# Patient Record
Sex: Female | Born: 1955 | Race: Black or African American | Hispanic: No | Marital: Single | Smoking: Never smoker
Health system: Southern US, Community
[De-identification: ages and names within clinical notes are randomized; demographics above are authoritative.]

## PROBLEM LIST (undated history)

## (undated) DIAGNOSIS — T7840XA Allergy, unspecified, initial encounter: Secondary | ICD-10-CM

## (undated) DIAGNOSIS — M1990 Unspecified osteoarthritis, unspecified site: Secondary | ICD-10-CM

## (undated) DIAGNOSIS — H9190 Unspecified hearing loss, unspecified ear: Secondary | ICD-10-CM

## (undated) DIAGNOSIS — E119 Type 2 diabetes mellitus without complications: Secondary | ICD-10-CM

## (undated) DIAGNOSIS — G000 Hemophilus meningitis: Secondary | ICD-10-CM

## (undated) DIAGNOSIS — I8392 Asymptomatic varicose veins of left lower extremity: Secondary | ICD-10-CM

## (undated) DIAGNOSIS — E785 Hyperlipidemia, unspecified: Secondary | ICD-10-CM

## (undated) DIAGNOSIS — G4730 Sleep apnea, unspecified: Secondary | ICD-10-CM

## (undated) HISTORY — PX: BREAST SURGERY: SHX581

## (undated) HISTORY — DX: Type 2 diabetes mellitus without complications: E11.9

## (undated) HISTORY — DX: Allergy, unspecified, initial encounter: T78.40XA

## (undated) HISTORY — DX: Hyperlipidemia, unspecified: E78.5

## (undated) HISTORY — DX: Hemophilus meningitis: G00.0

## (undated) HISTORY — PX: BREAST EXCISIONAL BIOPSY: SUR124

## (undated) HISTORY — PX: PARTIAL HYSTERECTOMY: SHX80

---

## 2000-12-08 ENCOUNTER — Other Ambulatory Visit: Admission: RE | Admit: 2000-12-08 | Discharge: 2000-12-08 | Payer: Self-pay | Admitting: Obstetrics and Gynecology

## 2001-04-21 ENCOUNTER — Encounter (INDEPENDENT_AMBULATORY_CARE_PROVIDER_SITE_OTHER): Payer: Self-pay | Admitting: Internal Medicine

## 2001-04-21 ENCOUNTER — Ambulatory Visit (HOSPITAL_COMMUNITY): Admission: RE | Admit: 2001-04-21 | Discharge: 2001-04-21 | Payer: Self-pay | Admitting: Internal Medicine

## 2002-05-05 ENCOUNTER — Encounter: Payer: Self-pay | Admitting: Internal Medicine

## 2002-05-05 ENCOUNTER — Ambulatory Visit (HOSPITAL_COMMUNITY): Admission: RE | Admit: 2002-05-05 | Discharge: 2002-05-05 | Payer: Self-pay | Admitting: Internal Medicine

## 2002-12-09 ENCOUNTER — Ambulatory Visit (HOSPITAL_COMMUNITY): Admission: RE | Admit: 2002-12-09 | Discharge: 2002-12-09 | Payer: Self-pay | Admitting: Obstetrics and Gynecology

## 2002-12-09 ENCOUNTER — Encounter: Payer: Self-pay | Admitting: Obstetrics and Gynecology

## 2003-11-04 ENCOUNTER — Emergency Department (HOSPITAL_COMMUNITY): Admission: EM | Admit: 2003-11-04 | Discharge: 2003-11-04 | Payer: Self-pay | Admitting: Emergency Medicine

## 2004-05-24 ENCOUNTER — Ambulatory Visit (HOSPITAL_COMMUNITY): Admission: RE | Admit: 2004-05-24 | Discharge: 2004-05-24 | Payer: Self-pay | Admitting: Internal Medicine

## 2005-03-25 ENCOUNTER — Emergency Department (HOSPITAL_COMMUNITY): Admission: EM | Admit: 2005-03-25 | Discharge: 2005-03-25 | Payer: Self-pay | Admitting: Emergency Medicine

## 2006-07-03 ENCOUNTER — Ambulatory Visit (HOSPITAL_COMMUNITY): Admission: RE | Admit: 2006-07-03 | Discharge: 2006-07-03 | Payer: Self-pay | Admitting: Internal Medicine

## 2009-05-02 ENCOUNTER — Emergency Department (HOSPITAL_COMMUNITY): Admission: EM | Admit: 2009-05-02 | Discharge: 2009-05-02 | Payer: Self-pay | Admitting: Emergency Medicine

## 2010-02-13 ENCOUNTER — Encounter: Payer: Self-pay | Admitting: Gastroenterology

## 2010-02-19 ENCOUNTER — Ambulatory Visit (HOSPITAL_COMMUNITY)
Admission: RE | Admit: 2010-02-19 | Discharge: 2010-02-19 | Payer: Self-pay | Source: Home / Self Care | Attending: Gastroenterology | Admitting: Gastroenterology

## 2010-02-26 ENCOUNTER — Ambulatory Visit (HOSPITAL_COMMUNITY)
Admission: RE | Admit: 2010-02-26 | Discharge: 2010-02-26 | Payer: Self-pay | Source: Home / Self Care | Attending: Internal Medicine | Admitting: Internal Medicine

## 2010-04-12 NOTE — Letter (Signed)
Summary: [facility] ORDER/TRIAGE  [facility] ORDER/TRIAGE   Imported By: Rexene Alberts 02/13/2010 15:11:45  _____________________________________________________________________  External Attachment:    Type:   Image     Comment:   External Document

## 2010-05-30 LAB — POCT CARDIAC MARKERS
CKMB, poc: 1.3 ng/mL (ref 1.0–8.0)
Myoglobin, poc: 75 ng/mL (ref 12–200)

## 2010-05-30 LAB — COMPREHENSIVE METABOLIC PANEL
ALT: 17 U/L (ref 0–35)
Alkaline Phosphatase: 98 U/L (ref 39–117)
Chloride: 104 mEq/L (ref 96–112)
GFR calc Af Amer: >60 mL/min (ref >60)
GFR calc non Af Amer: >60 mL/min (ref >60)
Glucose, Bld: 96 mg/dL (ref 70–99)
Potassium: 3.7 mEq/L (ref 3.5–5.1)
Sodium: 137 mEq/L (ref 135–145)
Total Bilirubin: 0.5 mg/dL (ref 0.3–1.2)

## 2010-05-30 LAB — CBC
MCHC: 33.4 g/dL (ref 30.0–36.0)
MCV: 88.8 fL (ref 78.0–100.0)
Platelets: 284 10*3/uL (ref 150–400)
RDW: 14.3 % (ref 11.5–15.5)

## 2010-05-30 LAB — DIFFERENTIAL
Basophils Relative: 1 % (ref 0–1)
Eosinophils Absolute: 0.2 10*3/uL (ref 0.0–0.7)
Eosinophils Relative: 3 % (ref 0–5)
Lymphs Abs: 2.6 10*3/uL (ref 0.7–4.0)

## 2010-05-30 LAB — PROTIME-INR: Prothrombin Time: 13.8 seconds (ref 11.6–15.2)

## 2010-06-25 ENCOUNTER — Other Ambulatory Visit (HOSPITAL_COMMUNITY): Payer: Self-pay | Admitting: Internal Medicine

## 2010-06-25 ENCOUNTER — Ambulatory Visit (HOSPITAL_COMMUNITY)
Admission: RE | Admit: 2010-06-25 | Discharge: 2010-06-25 | Disposition: A | Discharge status: Home / Self Care | Payer: BC Managed Care – PPO | Source: Ambulatory Visit | Attending: Internal Medicine | Admitting: Internal Medicine

## 2010-06-25 DIAGNOSIS — M25569 Pain in unspecified knee: Secondary | ICD-10-CM | POA: Insufficient documentation

## 2010-06-25 DIAGNOSIS — M545 Low back pain, unspecified: Secondary | ICD-10-CM | POA: Insufficient documentation

## 2010-07-26 ENCOUNTER — Encounter: Payer: Self-pay | Admitting: Orthopedic Surgery

## 2010-07-26 ENCOUNTER — Ambulatory Visit (INDEPENDENT_AMBULATORY_CARE_PROVIDER_SITE_OTHER): Payer: BC Managed Care – PPO | Admitting: Orthopedic Surgery

## 2010-07-26 VITALS — Ht 62.0 in | Wt 231.8 lb

## 2010-07-26 DIAGNOSIS — M545 Low back pain, unspecified: Secondary | ICD-10-CM

## 2010-07-26 DIAGNOSIS — M79605 Pain in left leg: Secondary | ICD-10-CM | POA: Insufficient documentation

## 2010-07-26 MED ORDER — PREDNISONE (PAK) 10 MG PO TABS: ORAL_TABLET | ORAL | Status: DC

## 2010-07-26 NOTE — Progress Notes (Signed)
Chief complaint LEFT knee pain and LEFT sided pain.  55 year old female presents with pain on the LEFT side of her hip, leg and also in the LEFT knee. Complains of sharp throbbing 9/10 pain, which seems to come and go is worse in the evening. It is improved with heat. It is worse with cold air and standing for long periods of time and is associated with numbness and tingling.  Should have x-rays at the hospital, which showed early onset degenerative changes in the knee. She also had lumbar spine film, which showed degenerative facet disease in the lower lumbar spine.  Her pain was initially relieved with tramadol 3 times a day. She also took several aspirin, as well as multiple tablets of Aleve daily. She has been advised that this will cause an ulcer and that this should not be done.  Her review of systems is remarkable for chills, times, blurred vision, chest pain, and snoring, seasonal allergies. She denies heartburn, frequency, joint pain other than stated, changes in her skin, nervousness, or anxiety, easy bruising or excessive thirst.  The vital signs are 231 pounds 5 foot 2. Respiratory rate of 20  General appearance was normal.  Cardiovascular exam showed normal pulses and normal temperature in her extremity.  Lymph system was negative in the lower extremities.  Skin is normal in the lower extremities.  She is awake, alert, and oriented x3. Mood and affect were normal.  Reflexes are 2+ and equal at the ankle and knee. Toes are downgoing. Straight leg raise test was positive at 30 on the LEFT normal on the RIGHT.  Lumbar spine was tender and L4-L5 junction in the LEFT hip and LEFT gluteal region.  LEFT and RIGHT knee. Range of motion was normal. Joints were stable. No effusion. No tenderness. No pain with range of motion.  Radiographs were reviewed report.  Impression low back pain with LEFT leg radicular symptoms.  Advised to stop aspirin and Aleve take a Dosepak for 12 days  and resume Aleve one b.i.d. Physical therapy for back pain. Return in 6 weeks.

## 2010-08-13 ENCOUNTER — Ambulatory Visit (HOSPITAL_COMMUNITY)
Admission: RE | Admit: 2010-08-13 | Discharge: 2010-08-13 | Disposition: A | Discharge status: Home / Self Care | Payer: BC Managed Care – PPO | Source: Ambulatory Visit | Attending: Internal Medicine | Admitting: Internal Medicine

## 2010-08-13 DIAGNOSIS — M545 Low back pain, unspecified: Secondary | ICD-10-CM | POA: Insufficient documentation

## 2010-08-13 DIAGNOSIS — R262 Difficulty in walking, not elsewhere classified: Secondary | ICD-10-CM | POA: Insufficient documentation

## 2010-08-13 DIAGNOSIS — IMO0001 Reserved for inherently not codable concepts without codable children: Secondary | ICD-10-CM | POA: Insufficient documentation

## 2010-08-13 DIAGNOSIS — M6281 Muscle weakness (generalized): Secondary | ICD-10-CM | POA: Insufficient documentation

## 2010-08-15 ENCOUNTER — Ambulatory Visit (HOSPITAL_COMMUNITY)
Admission: RE | Admit: 2010-08-15 | Discharge: 2010-08-15 | Disposition: A | Discharge status: Home / Self Care | Payer: BC Managed Care – PPO | Source: Ambulatory Visit | Attending: Internal Medicine | Admitting: Internal Medicine

## 2010-08-17 ENCOUNTER — Inpatient Hospital Stay (HOSPITAL_COMMUNITY)
Admission: RE | Admit: 2010-08-17 | Discharge: 2010-08-17 | Disposition: A | Discharge status: Home / Self Care | Payer: BC Managed Care – PPO | Source: Ambulatory Visit | Attending: Physical Therapy | Admitting: Physical Therapy

## 2010-08-20 ENCOUNTER — Inpatient Hospital Stay (HOSPITAL_COMMUNITY)
Admission: RE | Admit: 2010-08-20 | Discharge: 2010-08-20 | Disposition: A | Discharge status: Home / Self Care | Payer: BC Managed Care – PPO | Source: Ambulatory Visit | Attending: Physical Therapy | Admitting: Physical Therapy

## 2010-08-22 ENCOUNTER — Inpatient Hospital Stay (HOSPITAL_COMMUNITY)
Admission: RE | Admit: 2010-08-22 | Discharge: 2010-08-22 | Disposition: A | Discharge status: Home / Self Care | Payer: BC Managed Care – PPO | Source: Ambulatory Visit | Attending: Physical Therapy | Admitting: Physical Therapy

## 2010-08-24 ENCOUNTER — Ambulatory Visit (HOSPITAL_COMMUNITY): Payer: BC Managed Care – PPO

## 2010-08-27 ENCOUNTER — Ambulatory Visit (HOSPITAL_COMMUNITY): Payer: BC Managed Care – PPO | Admitting: Physical Therapy

## 2010-08-29 ENCOUNTER — Ambulatory Visit (HOSPITAL_COMMUNITY)
Admission: RE | Admit: 2010-08-29 | Discharge: 2010-08-29 | Disposition: A | Discharge status: Home / Self Care | Payer: BC Managed Care – PPO | Source: Ambulatory Visit | Attending: Internal Medicine | Admitting: Internal Medicine

## 2010-08-31 ENCOUNTER — Ambulatory Visit (HOSPITAL_COMMUNITY): Payer: BC Managed Care – PPO | Admitting: Physical Therapy

## 2010-09-03 ENCOUNTER — Ambulatory Visit (HOSPITAL_COMMUNITY): Payer: BC Managed Care – PPO | Admitting: Physical Therapy

## 2010-09-05 ENCOUNTER — Ambulatory Visit (HOSPITAL_COMMUNITY)
Admission: RE | Admit: 2010-09-05 | Discharge: 2010-09-05 | Disposition: A | Discharge status: Home / Self Care | Payer: BC Managed Care – PPO | Source: Ambulatory Visit | Attending: Internal Medicine | Admitting: Internal Medicine

## 2010-09-06 ENCOUNTER — Ambulatory Visit: Payer: BC Managed Care – PPO | Admitting: Orthopedic Surgery

## 2010-09-07 ENCOUNTER — Ambulatory Visit (HOSPITAL_COMMUNITY): Payer: BC Managed Care – PPO

## 2010-09-10 ENCOUNTER — Ambulatory Visit (HOSPITAL_COMMUNITY): Payer: BC Managed Care – PPO | Admitting: Physical Therapy

## 2010-09-14 ENCOUNTER — Ambulatory Visit (HOSPITAL_COMMUNITY): Payer: BC Managed Care – PPO | Admitting: *Deleted

## 2010-09-17 ENCOUNTER — Ambulatory Visit (HOSPITAL_COMMUNITY): Payer: BC Managed Care – PPO | Admitting: Physical Therapy

## 2010-09-19 ENCOUNTER — Ambulatory Visit (HOSPITAL_COMMUNITY): Payer: BC Managed Care – PPO

## 2010-09-21 ENCOUNTER — Ambulatory Visit (HOSPITAL_COMMUNITY): Payer: BC Managed Care – PPO | Admitting: Physical Therapy

## 2011-07-09 ENCOUNTER — Other Ambulatory Visit (HOSPITAL_COMMUNITY): Payer: Self-pay | Admitting: Internal Medicine

## 2011-07-15 ENCOUNTER — Ambulatory Visit (HOSPITAL_COMMUNITY): Payer: BC Managed Care – PPO

## 2013-01-10 ENCOUNTER — Inpatient Hospital Stay (HOSPITAL_COMMUNITY): Payer: BC Managed Care – PPO

## 2013-01-10 ENCOUNTER — Emergency Department (HOSPITAL_COMMUNITY): Payer: BC Managed Care – PPO

## 2013-01-10 ENCOUNTER — Encounter (HOSPITAL_COMMUNITY): Payer: Self-pay | Admitting: Emergency Medicine

## 2013-01-10 ENCOUNTER — Inpatient Hospital Stay (HOSPITAL_COMMUNITY)
Admission: EM | Admit: 2013-01-10 | Discharge: 2013-01-14 | DRG: 871 | Disposition: A | Discharge status: Home / Self Care | Payer: BC Managed Care – PPO | Attending: Internal Medicine | Admitting: Internal Medicine

## 2013-01-10 DIAGNOSIS — G039 Meningitis, unspecified: Secondary | ICD-10-CM

## 2013-01-10 DIAGNOSIS — R7309 Other abnormal glucose: Secondary | ICD-10-CM | POA: Diagnosis present

## 2013-01-10 DIAGNOSIS — Z7982 Long term (current) use of aspirin: Secondary | ICD-10-CM

## 2013-01-10 DIAGNOSIS — M545 Low back pain: Secondary | ICD-10-CM

## 2013-01-10 DIAGNOSIS — A3212 Listerial meningoencephalitis: Secondary | ICD-10-CM | POA: Diagnosis present

## 2013-01-10 DIAGNOSIS — R509 Fever, unspecified: Secondary | ICD-10-CM

## 2013-01-10 DIAGNOSIS — R4182 Altered mental status, unspecified: Secondary | ICD-10-CM

## 2013-01-10 DIAGNOSIS — G000 Hemophilus meningitis: Secondary | ICD-10-CM

## 2013-01-10 DIAGNOSIS — R7881 Bacteremia: Secondary | ICD-10-CM

## 2013-01-10 DIAGNOSIS — Z113 Encounter for screening for infections with a predominantly sexual mode of transmission: Secondary | ICD-10-CM

## 2013-01-10 DIAGNOSIS — Z853 Personal history of malignant neoplasm of breast: Secondary | ICD-10-CM

## 2013-01-10 DIAGNOSIS — A419 Sepsis, unspecified organism: Secondary | ICD-10-CM | POA: Diagnosis present

## 2013-01-10 DIAGNOSIS — G009 Bacterial meningitis, unspecified: Secondary | ICD-10-CM | POA: Diagnosis present

## 2013-01-10 DIAGNOSIS — Z79899 Other long term (current) drug therapy: Secondary | ICD-10-CM

## 2013-01-10 DIAGNOSIS — A413 Sepsis due to Hemophilus influenzae: Principal | ICD-10-CM | POA: Diagnosis present

## 2013-01-10 LAB — RAPID URINE DRUG SCREEN, HOSP PERFORMED
Amphetamines: NOT DETECTED
Barbiturates: NOT DETECTED
Benzodiazepines: NOT DETECTED

## 2013-01-10 LAB — URINALYSIS, ROUTINE W REFLEX MICROSCOPIC
Bilirubin Urine: NEGATIVE
Glucose, UA: NEGATIVE mg/dL
Ketones, ur: NEGATIVE mg/dL
Leukocytes, UA: NEGATIVE
pH: 7.5 (ref 5.0–8.0)

## 2013-01-10 LAB — BLOOD GAS, ARTERIAL
Acid-base deficit: 0.8 mmol/L (ref 0.0–2.0)
Bicarbonate: 22.9 mEq/L (ref 20.0–24.0)
O2 Saturation: 98.4 %
Patient temperature: 37
TCO2: 20.2 mmol/L (ref 0–100)
pO2, Arterial: 119 mmHg — ABNORMAL HIGH (ref 80.0–100.0)

## 2013-01-10 LAB — LACTIC ACID, PLASMA: Lactic Acid, Venous: 1.1 mmol/L (ref 0.5–2.2)

## 2013-01-10 LAB — CBC WITH DIFFERENTIAL/PLATELET
Lymphocytes Relative: 9 % — ABNORMAL LOW (ref 12–46)
Lymphs Abs: 1.2 10*3/uL (ref 0.7–4.0)
MCV: 88.8 fL (ref 78.0–100.0)
Neutrophils Relative %: 82 % — ABNORMAL HIGH (ref 43–77)
Platelets: 317 10*3/uL (ref 150–400)
RBC: 4.36 MIL/uL (ref 3.87–5.11)
WBC: 13.8 10*3/uL — ABNORMAL HIGH (ref 4.0–10.5)

## 2013-01-10 LAB — BASIC METABOLIC PANEL
CO2: 25 mEq/L (ref 19–32)
Glucose, Bld: 151 mg/dL — ABNORMAL HIGH (ref 70–99)
Potassium: 3.7 mEq/L (ref 3.5–5.1)
Sodium: 134 mEq/L — ABNORMAL LOW (ref 135–145)

## 2013-01-10 LAB — HEPATIC FUNCTION PANEL
Alkaline Phosphatase: 140 U/L — ABNORMAL HIGH (ref 39–117)
Bilirubin, Direct: <0.1 mg/dL (ref 0.0–0.3)
Total Protein: 8.2 g/dL (ref 6.0–8.3)

## 2013-01-10 LAB — GLUCOSE, CAPILLARY: Glucose-Capillary: 127 mg/dL — ABNORMAL HIGH (ref 70–99)

## 2013-01-10 LAB — ETHANOL: Alcohol, Ethyl (B): <11 mg/dL (ref 0–11)

## 2013-01-10 MED ORDER — LIDOCAINE-EPINEPHRINE (PF) 1 %-1:200000 IJ SOLN
INTRAMUSCULAR | Status: AC
  Filled 2013-01-10: qty 10

## 2013-01-10 MED ORDER — MIDAZOLAM HCL 5 MG/5ML IJ SOLN
INTRAMUSCULAR | Status: AC | PRN
  Administered 2013-01-10: 5 mg via INTRAVENOUS

## 2013-01-10 MED ORDER — DEXTROSE 5 % IV SOLN
500.0000 mg | Freq: Three times a day (TID) | INTRAVENOUS | Status: DC
  Administered 2013-01-10 – 2013-01-12 (×6): 500 mg via INTRAVENOUS
  Filled 2013-01-10 (×13): qty 10

## 2013-01-10 MED ORDER — VANCOMYCIN HCL IN DEXTROSE 1-5 GM/200ML-% IV SOLN
1000.0000 mg | Freq: Once | INTRAVENOUS | Status: AC
  Administered 2013-01-10: 1000 mg via INTRAVENOUS
  Filled 2013-01-10: qty 200

## 2013-01-10 MED ORDER — DEXTROSE 5 % IV SOLN
600.0000 mg | Freq: Three times a day (TID) | INTRAVENOUS | Status: DC
  Filled 2013-01-10 (×4): qty 12

## 2013-01-10 MED ORDER — VANCOMYCIN HCL 10 G IV SOLR
1250.0000 mg | Freq: Two times a day (BID) | INTRAVENOUS | Status: DC
  Filled 2013-01-10 (×3): qty 1250

## 2013-01-10 MED ORDER — METHYLPREDNISOLONE SODIUM SUCC 125 MG IJ SOLR
125.0000 mg | Freq: Once | INTRAMUSCULAR | Status: AC
  Administered 2013-01-10: 125 mg via INTRAVENOUS
  Filled 2013-01-10: qty 2

## 2013-01-10 MED ORDER — DEXTROSE 5 % IV SOLN
2.0000 g | Freq: Two times a day (BID) | INTRAVENOUS | Status: DC
  Administered 2013-01-11 (×3): 2 g via INTRAVENOUS
  Filled 2013-01-10 (×9): qty 2

## 2013-01-10 MED ORDER — MORPHINE SULFATE 2 MG/ML IJ SOLN
2.0000 mg | INTRAMUSCULAR | Status: DC | PRN
  Administered 2013-01-10 – 2013-01-12 (×7): 2 mg via INTRAVENOUS
  Filled 2013-01-10 (×6): qty 1

## 2013-01-10 MED ORDER — MIDAZOLAM HCL 5 MG/5ML IJ SOLN
INTRAMUSCULAR | Status: AC
  Filled 2013-01-10: qty 10

## 2013-01-10 MED ORDER — SODIUM CHLORIDE 0.9 % IJ SOLN
3.0000 mL | Freq: Two times a day (BID) | INTRAMUSCULAR | Status: DC
  Administered 2013-01-11 – 2013-01-12 (×2): 3 mL via INTRAVENOUS

## 2013-01-10 MED ORDER — ACETAMINOPHEN 325 MG PO TABS: 650.0000 mg | ORAL_TABLET | Freq: Four times a day (QID) | ORAL | Status: DC | PRN

## 2013-01-10 MED ORDER — ACETAMINOPHEN 650 MG RE SUPP
650.0000 mg | Freq: Once | RECTAL | Status: AC
  Administered 2013-01-10: 650 mg via RECTAL
  Filled 2013-01-10: qty 1

## 2013-01-10 MED ORDER — DEXTROSE 5 % IV SOLN
2.0000 g | Freq: Once | INTRAVENOUS | Status: AC
  Administered 2013-01-10: 2 g via INTRAVENOUS
  Filled 2013-01-10: qty 2

## 2013-01-10 MED ORDER — VANCOMYCIN HCL 10 G IV SOLR
2000.0000 mg | Freq: Once | INTRAVENOUS | Status: AC
  Administered 2013-01-11: 2000 mg via INTRAVENOUS
  Filled 2013-01-10: qty 2000

## 2013-01-10 MED ORDER — HEPARIN SODIUM (PORCINE) 5000 UNIT/ML IJ SOLN: 5000.0000 [IU] | Freq: Three times a day (TID) | INTRAMUSCULAR | Status: DC

## 2013-01-10 MED ORDER — ONDANSETRON HCL 4 MG/2ML IJ SOLN: 4.0000 mg | Freq: Four times a day (QID) | INTRAMUSCULAR | Status: DC | PRN

## 2013-01-10 MED ORDER — SULFAMETHOXAZOLE-TMP DS 800-160 MG PO TABS: 1.0000 | ORAL_TABLET | Freq: Once | ORAL | Status: DC

## 2013-01-10 MED ORDER — SODIUM CHLORIDE 0.9 % IV SOLN
INTRAVENOUS | Status: DC
  Administered 2013-01-10: 18:00:00 via INTRAVENOUS
  Administered 2013-01-11: 50 mL/h via INTRAVENOUS
  Administered 2013-01-13: 23:00:00 via INTRAVENOUS

## 2013-01-10 MED ORDER — ACETAMINOPHEN 650 MG RE SUPP: 650.0000 mg | Freq: Four times a day (QID) | RECTAL | Status: DC | PRN

## 2013-01-10 MED ORDER — ONDANSETRON HCL 4 MG PO TABS: 4.0000 mg | ORAL_TABLET | Freq: Four times a day (QID) | ORAL | Status: DC | PRN

## 2013-01-10 MED ORDER — DOXYCYCLINE HYCLATE 100 MG PO TABS: 100.0000 mg | ORAL_TABLET | Freq: Once | ORAL | Status: DC

## 2013-01-10 MED ORDER — LIDOCAINE HCL (PF) 1 % IJ SOLN
INTRAMUSCULAR | Status: AC
  Filled 2013-01-10: qty 10

## 2013-01-10 NOTE — ED Notes (Signed)
[facility] at bedside 

## 2013-01-10 NOTE — ED Notes (Signed)
Patient denies pain and is resting comfortably.  

## 2013-01-10 NOTE — Progress Notes (Signed)
Attempted to call RN back at [phone], # that was given by Tiffany at 1647.

## 2013-01-10 NOTE — ED Notes (Signed)
Vital signs stable. 

## 2013-01-10 NOTE — ED Provider Notes (Signed)
CSN: [id number]     Arrival date & time 01/10/13  1037 History     This chart was scribed for Hilario Quarry, MD by Valera Castle, ED Scribe. This patient was seen in room APA09/APA09 and the patient's care was started at 11:02 AM.   Chief Complaint  Patient presents with  . Headache  . Altered Mental Status   Patient is a 57 y.o. female presenting with headaches. The history is provided by the patient and a relative. No language interpreter was used.  Headache Pain location:  Generalized Onset quality:  Sudden Duration:  1 day Timing:  Constant Progression:  Worsening Chronicity:  New Similar to prior headaches: no   Relieved by:  Nothing Associated symptoms: back pain, fever and vomiting   Associated symptoms: no cough   Fever:    Fever duration: Earlier this morning.   Timing:  Constant   Max temp PTA (F):  101.6   Progression:  Unchanged  HPI Comments: Caroline Velez is a 57 y.o. female brought in by her sister and daughter who presents to the Emergency Department complaining of a sudden, severe, constant headache and sudden altered mental status, onset this morning. Her daughter states the pt was at her friend's house last night, and that she had a headache before going to bed. She reports that the pt ate some grapes and took Aleve, with little relief. Her daughter reports that she took Exedrin for the headache earlier this morning after she came home. Her daughter reports that she was unresponsive after waking up, which is unusual for her. She reports the pt having associated fever and emesis. She denies the pt having h/o similar symptoms, and states the pt has never been critically ill. She does report the pt having a h/o back pain, but denies the pt having any other associated symptoms. She denies EtOH use. She denies h/o DM and lung disease. She denies the pt having a flu vaccination. 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  PCP Avon Gully,  MD   Past Medical History  Diagnosis Date  . Allergy    Past Surgical History  Procedure Laterality Date  . Breast surgery      bilateral   Family History  Problem Relation Age of Onset  . Cancer     History  Substance Use Topics  . Smoking status: Never Smoker   . Smokeless tobacco: Not on file  . Alcohol Use: No   OB History   Grav Para Term Preterm Abortions TAB SAB Ect Mult Living                 Review of Systems  Constitutional: Positive for fever.  Respiratory: Negative for cough.   Gastrointestinal: Positive for vomiting.  Musculoskeletal: Positive for back pain.  Neurological: Positive for headaches.  All other systems reviewed and are negative.    Allergies  Review of patient's allergies indicates no known allergies.  Home Medications   Current Outpatient Rx  Name  Route  Sig  Dispense  Refill  . aspirin-acetaminophen-caffeine (EXCEDRIN MIGRAINE) 250-250-65 MG per tablet   Oral   Take 1 tablet by mouth every 6 (six) hours as needed for pain.         . budesonide-formoterol (SYMBICORT) 160-4.5 MCG/ACT inhaler   Inhalation   Inhale 2 puffs into the lungs 2 (two) times daily.         . naproxen sodium (ALEVE) 220 MG tablet  Oral   Take 220 mg by mouth 2 (two) times daily with a meal.          Triage Vitals: BP 136/73  Pulse 100  Temp(Src) 101.6 F (38.7 C) (Rectal)  Resp 18  SpO2 95%  Physical Exam  Nursing note and vitals reviewed. Constitutional: She appears well-developed and well-nourished. No distress.  HENT:  Head: Normocephalic and atraumatic.  Neck: No tracheal deviation present.  Neck rigid upon exam.  Cardiovascular: Normal rate.   Pulmonary/Chest: Effort normal. No respiratory distress.  Abdominal: Soft. Bowel sounds are normal.  Musculoskeletal: Normal range of motion.  Neurological:  Patient with decreased loc, does not follow directions, she orients to pain, mumbling is only verbalization.  All four extremities  move equally.  DTR equal throuhout.   Skin: Skin is warm and dry. No rash noted.    ED Course  LUMBAR PUNCTURE Date/Time: 01/10/2013 1:32 PM Performed by: Hilario Quarry Authorized by: Hilario Quarry Consent: written consent obtained. Risks and benefits: risks, benefits and alternatives were discussed Consent given by: daughter. Patient identity confirmed: arm band Time out: Immediately prior to procedure a "time out" was called to verify the correct patient, procedure, equipment, support staff and site/side marked as required. Indications: evaluation for infection Anesthesia: local infiltration Local anesthetic: lidocaine 1% with epinephrine Anesthetic total: 5 ml Patient sedated: yes Sedation type: anxiolysis Sedatives: midazolam Vitals: Vital signs were monitored during sedation. Preparation: Patient was prepped and draped in the usual sterile fashion. Lumbar space: L4-L5 interspace Patient's position: left lateral decubitus Needle gauge: 22 Needle type: diamond point Number of attempts: 5 or more Total volume: 0 ml Patient tolerance: Patient tolerated the procedure well with no immediate complications.  ARTERIAL BLOOD GAS Date/Time: 01/10/2013 4:01 PM Performed by: Hilario Quarry Authorized by: Hilario Quarry Consent: Verbal consent obtained. written consent not obtained. Time out: Immediately prior to procedure a "time out" was called to verify the correct patient, procedure, equipment, support staff and site/side marked as required. Location: right radial Preparation: Patient was prepped and draped in the usual sterile fashion. Allen's test normal: yes Number of attempts: 1 Method: Single percutaneous needle puncture Manual pressure: manual pressure applied for more than 5 minutes Post-procedure CMS: normal Patient tolerance: Patient tolerated the procedure well with no immediate complications.   (including critical care time)  DIAGNOSTIC STUDIES: Oxygen  Saturation is 95% on room air, adequate by my interpretation.    COORDINATION OF CARE: 11:07 AM-Discussed treatment plan which includes EKG, Hepatic function panel, UA, Tylenol, CBC, BMP, CBG monitoring, and LP with pt at bedside and pt agreed to plan.  Labs Review Labs Reviewed  CBC WITH DIFFERENTIAL - Abnormal; Notable for the following:    WBC 13.8 (*)    Neutrophils Relative % 82 (*)    Neutro Abs 11.4 (*)    Lymphocytes Relative 9 (*)    Monocytes Absolute 1.2 (*)    All other components within normal limits  BASIC METABOLIC PANEL - Abnormal; Notable for the following:    Sodium 134 (*)    Glucose, Bld 151 (*)    GFR calc non Af Amer 69 (*)    GFR calc Af Amer 80 (*)    All other components within normal limits  HEPATIC FUNCTION PANEL - Abnormal; Notable for the following:    Alkaline Phosphatase 140 (*)    All other components within normal limits  GLUCOSE, CAPILLARY - Abnormal; Notable for the following:    Glucose-Capillary 127 (*)  All other components within normal limits  CULTURE, BLOOD (ROUTINE X 2)  CULTURE, BLOOD (ROUTINE X 2)  URINALYSIS, ROUTINE W REFLEX MICROSCOPIC  URINE RAPID DRUG SCREEN (HOSP PERFORMED)  ETHANOL   Imaging Review Ct Head Wo Contrast  01/10/2013   CLINICAL DATA:  Initial encounter for acute mental status changes. Patient currently unresponsive.  EXAM: CT HEAD WITHOUT CONTRAST  TECHNIQUE: Contiguous axial images were obtained from the base of the skull through the vertex without intravenous contrast.  COMPARISON:  None.  FINDINGS: Head tilt in the gantry accounts for apparent asymmetry in the cerebral hemispheres. Ventricular system normal in size and appearance for age. No mass lesion. No midline shift. No acute hemorrhage or hematoma. No extra-axial fluid collections. No evidence of acute infarction. Cerebellar tonsils extend below the level of the medulla.  No focal osseous abnormality involving the skull. Mucosal thickening involving the  sphenoid sinuses and minimal mucosal thickening involving the visualized maxillary sinuses. Opacification of left mastoid air cells and the left middle ear cavity. Right mastoid air cells and right middle ear cavity well aerated.  IMPRESSION: 1. No acute intracranial abnormality. 2. 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 3. Left mastoid effusion and left middle ear cavity effusion. Minimal chronic sinus disease involving the maxillary and sphenoid sinuses. Non emergent MRI of the brain without and with contrast is recommended to confirm or deny the presence of a Chiari malformation.   Electronically Signed   By: Hulan Saas M.D.   On: 01/10/2013 12:36    EKG Interpretation     Ventricular Rate:  82 PR Interval:  126 QRS Duration: 74 QT Interval:  352 QTC Calculation: 411 R Axis:   -8 Text Interpretation:  Sinus rhythm with Premature atrial complexes Otherwise normal ECG When compared with ECG of 02-May-2009 12:24, Premature atrial complexes are now Present            MDM  No diagnosis found. Patient with history of complaint of headache  now with altered loc.  ddx bleed, infection, toxic ingestion, trauma, - no history of infection , no evidence of bleed on head ct.  Historically no trauma, or history of ingestion.  Exam with fever and nuchal rigidity very concerning for infection.  Patient with mastoiditis on ct.  After initial evaluation, rocephin and vancomycin ordered and infusing.  LP attempted without success.  Discussed with Dr. Rhona Leavens and he has seen and assessed.  Plan transfer to [facility] to Dr. Sharon Seller pending abg- patient appears to protect airway with gag intact, but will assess for hypercarbia.   I personally performed the services described in this documentation, which was scribed in my presence. The recorded information has been reviewed and considered. CRITICAL CARE Performed by: Hilario Quarry Total critical care time: 75 Critical care  time was exclusive of separately billable procedures and treating other patients. Critical care was necessary to treat or prevent imminent or life-threatening deterioration. Critical care was time spent personally by me on the following activities: development of treatment plan with patient and/or surrogate as well as nursing, discussions with consultants, evaluation of patient's response to treatment, examination of patient, obtaining history from patient or surrogate, ordering and performing treatments and interventions, ordering and review of laboratory studies, ordering and review of radiographic studies, pulse oximetry and re-evaluation of patient's condition.   Hilario Quarry, MD 01/10/13 [phone]

## 2013-01-10 NOTE — ED Notes (Signed)
Dr. Rosalia Hammers using ultrasound to assist with LP

## 2013-01-10 NOTE — Progress Notes (Signed)
ANTIBIOTIC CONSULT NOTE - INITIAL  Pharmacy Consult for Vancomycin & Rocephin Indication: possible meningitis  No Known Allergies  Patient Measurements: Weight: 280 lb (127.007 kg) Adjusted Body Weight:  IBW 50 kg  Vital Signs: Temp: 101.6 F (38.7 C) (11/02 1046) Temp src: Rectal (11/02 1046) BP: 116/38 mmHg (11/02 1414) Pulse Rate: 77 (11/02 1414) Intake/Output from previous day:   Intake/Output from this shift:    Labs:  Recent Labs  01/10/13 1133  WBC 13.8*  HGB 12.8  PLT 317  CREATININE 0.91   The CrCl is unknown because both a height and weight (above a minimum accepted value) are required for this calculation. No results found for this basename: VANCOTROUGH, Leodis Binet, VANCORANDOM, GENTTROUGH, GENTPEAK, GENTRANDOM, TOBRATROUGH, TOBRAPEAK, TOBRARND, AMIKACINPEAK, AMIKACINTROU, AMIKACIN,  in the last 72 hours   Microbiology: Recent Results (from the past 720 hour(s))  CULTURE, BLOOD (ROUTINE X 2)     Status: None   Collection Time    01/10/13 11:30 AM      Result Value Range Status   Specimen Description     Final   Value: BLOOD BLOOD RIGHT ARM BOTTLES DRAWN AEROBIC AND ANAEROBIC   Special Requests 8CC   Final   Culture PENDING   Incomplete   Report Status PENDING   Incomplete  CULTURE, BLOOD (ROUTINE X 2)     Status: None   Collection Time    01/10/13 11:40 AM      Result Value Range Status   Specimen Description BLOOD BLOOD LEFT HAND BOTTLES DRAWN AEROBIC ONLY   Final   Special Requests [facility]   Final   Culture PENDING   Incomplete   Report Status PENDING   Incomplete    Medical History: Past Medical History  Diagnosis Date  . Allergy     Medications:  Scheduled:  . lidocaine (PF)      . lidocaine-EPINEPHrine      . midazolam      . sodium chloride  3 mL Intravenous Q12H   Assessment: Possible meningitis Rocephin 2 GM IV given in ER Acyclovir 500 mg ( 10 mg/kg for IBW 50 kg) every 8 hours started in ER CrCl approximately 80 ml/min  Goal  of Therapy:  Vancomycin trough level 15-20 mcg/ml  Plan:  Vancomycin 2 GM IV loading dose, then 1250 mg IV every 12 hours Vancomycin trough at steady state Rocephin 2 GM IV every 12 hours Acyclovir 500 mg IV every 8 hours Labs per protocol  Caroline Velez, Caroline Velez 01/10/2013,2:45 PM

## 2013-01-10 NOTE — ED Notes (Signed)
RN at bedside with MD for Lumbar puncture attempt

## 2013-01-10 NOTE — ED Notes (Signed)
Dr. Rosalia Hammers in room with pt talking to family about transfer to [facility].

## 2013-01-10 NOTE — ED Notes (Signed)
Patient left with [facility] at this time  

## 2013-01-10 NOTE — ED Notes (Signed)
Report given to [facility], RN 3S.

## 2013-01-10 NOTE — ED Notes (Signed)
Patient arrives via EMS from home with c/o headache. Patient lethargic, arouses, but will not answer questions. Follows simple commands. Febrile

## 2013-01-10 NOTE — ED Notes (Signed)
Dr. Rosalia Hammers attempting lumbar puncture.   Antony Odea, NT and [place] NT holding pt.

## 2013-01-10 NOTE — ED Notes (Signed)
MD at bedside. 

## 2013-01-10 NOTE — ED Notes (Signed)
Attempted to call report to floor; RN to call back  

## 2013-01-10 NOTE — ED Notes (Signed)
Patient is resting comfortably. 

## 2013-01-10 NOTE — H&P (Signed)
[facility] History and Physical  LYBERTI THRUSH ZOX:[id number] DOB: 20-Jun-1955 DOA: 01/10/2013  Referring physician: Emergency Department PCP: Avon Gully, MD  Specialists:   Chief Complaint: Headache, mental status change  HPI: Caroline Velez is a 57 y.o. female  With no significant PMH who presents to the ED with a new onset headache that started last night. Pt was with family members in her usual state of health eating washed grapes when she later noted worsening headaches shortly thereafter. The patient went to bed, but was found to be more lethargic and altered on the morning of admission.The patient was then brought to the ED where she remained lethargic. The patient was found to be febrile with a temp of over 101 and a wbc of over 13K. A UA was unremarkable. A head CT demonstrated a L mastoid effusion but was neg for bleed. A bedside LP was attempted, but was unsuccessful. The hospitalist was subsequently consulted.  Review of Systems:  Per above, remainder 10pt ros reviewed and are neg  Past Medical History  Diagnosis Date  . Allergy    Past Surgical History  Procedure Laterality Date  . Breast surgery      bilateral   Social History:  reports that she has never smoked. She does not have any smokeless tobacco history on file. She reports that she does not drink alcohol or use illicit drugs.  where does patient live--home, ALF, SNF? and with whom if at home?  Can patient participate in ADLs?  No Known Allergies  Family History  Problem Relation Age of Onset  . Cancer      (be sure to complete)  Prior to Admission medications   Medication Sig Start Date End Date Taking? Authorizing Provider  aspirin-acetaminophen-caffeine (EXCEDRIN MIGRAINE) [phone] MG per tablet Take 1 tablet by mouth every 6 (six) hours as needed for pain.   Yes Historical Provider, MD  budesonide-formoterol (SYMBICORT) 160-4.5 MCG/ACT inhaler Inhale 2 puffs into the lungs 2 (two) times  daily.   Yes Historical Provider, MD  naproxen sodium (ALEVE) 220 MG tablet Take 220 mg by mouth 2 (two) times daily with a meal.   Yes Historical Provider, MD   Physical Exam: Filed Vitals:   01/10/13 1342 01/10/13 1346 01/10/13 1351 01/10/13 1414  BP: 107/56 110/45 116/53 116/38  Pulse: 90 77  77  Temp:      TempSrc:      Resp: 27 25  26   Weight:    127.007 kg (280 lb)  SpO2: 100% 99% 100% 100%     General:  Awake, in nad, appears drowsy  Eyes: PERRL B  ENT: membranes moist, dentition fair  Neck: trachea midline, neck supple  Cardiovascular: regular, s1, s2  Respiratory: normal resp effort, no wheezing  Abdomen: soft, nondistended  Skin: normal skin turgor, no abnormal skin lesions seen  Musculoskeletal: perfused, no clubbing  Psychiatric: unable to assess given mental status change  Neurologic: difficult to assess given mental status change  Labs on Admission:  Basic Metabolic Panel:  Recent Labs Lab 01/10/13 1133  NA 134*  K 3.7  CL 99  CO2 25  GLUCOSE 151*  BUN 10  CREATININE 0.91  CALCIUM 9.6   Liver Function Tests:  Recent Labs Lab 01/10/13 1133  AST 20  ALT 14  ALKPHOS 140*  BILITOT 0.3  PROT 8.2  ALBUMIN 3.8   No results found for this basename: LIPASE, AMYLASE,  in the last 168 hours No results found for this basename:  AMMONIA,  in the last 168 hours CBC:  Recent Labs Lab 01/10/13 1133  WBC 13.8*  NEUTROABS 11.4*  HGB 12.8  HCT 38.7  MCV 88.8  PLT 317   Cardiac Enzymes: No results found for this basename: CKTOTAL, CKMB, CKMBINDEX, TROPONINI,  in the last 168 hours  BNP (last 3 results) No results found for this basename: PROBNP,  in the last 8760 hours CBG:  Recent Labs Lab 01/10/13 1149  GLUCAP 127*    Radiological Exams on Admission: Ct Head Wo Contrast  01/10/2013   CLINICAL DATA:  Initial encounter for acute mental status changes. Patient currently unresponsive.  EXAM: CT HEAD WITHOUT CONTRAST  TECHNIQUE:  Contiguous axial images were obtained from the base of the skull through the vertex without intravenous contrast.  COMPARISON:  None.  FINDINGS: Head tilt in the gantry accounts for apparent asymmetry in the cerebral hemispheres. Ventricular system normal in size and appearance for age. No mass lesion. No midline shift. No acute hemorrhage or hematoma. No extra-axial fluid collections. No evidence of acute infarction. Cerebellar tonsils extend below the level of the medulla.  No focal osseous abnormality involving the skull. Mucosal thickening involving the sphenoid sinuses and minimal mucosal thickening involving the visualized maxillary sinuses. Opacification of left mastoid air cells and the left middle ear cavity. Right mastoid air cells and right middle ear cavity well aerated.  IMPRESSION: 1. No acute intracranial abnormality. 2. 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 3. Left mastoid effusion and left middle ear cavity effusion. Minimal chronic sinus disease involving the maxillary and sphenoid sinuses. Non emergent MRI of the brain without and with contrast is recommended to confirm or deny the presence of a Chiari malformation.   Electronically Signed   By: Hulan Saas M.D.   On: 01/10/2013 12:36    EKG: Independently reviewed. NSR  Assessment/Plan Principal Problem:   Sepsis Active Problems:   Headache   Mental status change   Meningitis   Fever   1. Suspected meningitis with sepsis 1. Unable to perform bedside LP 2. Cont on empiric vanc, rocephin, acyclovir 3. Will need IR guided LP, therefore will transfer to [facility], stepdown for care not available at this facility 4. Have discussed case with hospitalist at [facility] 5. Lactate normal 6. ABG unremarkable 2. Fever 1. Likely secondary to above 2. No leukocytosis 3. UA unremarkable 4. Blood cx pending 3. MS change 1. Likely secondary to #1 4. DVT prophylaxis 1. SCD's  Code Status:  Full (must indicate code status--if unknown or must be presumed, indicate so) Family Communication: Pt and family in room (indicate person spoken with, if applicable, with phone number if by telephone) Disposition Plan: Pending (indicate anticipated LOS)  Time spent:  Fines Kimberlin K [facility] Pager [phone]  If 7PM-7AM, please contact night-coverage [email or web address] Password [facility] 01/10/2013, 2:49 PM

## 2013-01-11 ENCOUNTER — Inpatient Hospital Stay (HOSPITAL_COMMUNITY): Payer: BC Managed Care – PPO

## 2013-01-11 LAB — GRAM STAIN

## 2013-01-11 LAB — CSF CELL COUNT WITH DIFFERENTIAL
Eosinophils, CSF: 0 % (ref 0–1)
Eosinophils, CSF: 0 % (ref 0–1)
Lymphs, CSF: 12 % — ABNORMAL LOW (ref 40–80)
Lymphs, CSF: 8 % — ABNORMAL LOW (ref 40–80)
Monocyte-Macrophage-Spinal Fluid: 3 % — ABNORMAL LOW (ref 15–45)
Monocyte-Macrophage-Spinal Fluid: 5 % — ABNORMAL LOW (ref 15–45)
Segmented Neutrophils-CSF: 85 % — ABNORMAL HIGH (ref 0–6)
Segmented Neutrophils-CSF: 87 % — ABNORMAL HIGH (ref 0–6)
WBC, CSF: 3280 /mm3 (ref 0–5)

## 2013-01-11 LAB — PROTEIN AND GLUCOSE, CSF: Total  Protein, CSF: 211 mg/dL — ABNORMAL HIGH (ref 15–45)

## 2013-01-11 MED ORDER — OXYCODONE HCL 5 MG PO TABS
5.0000 mg | ORAL_TABLET | ORAL | Status: DC | PRN
  Administered 2013-01-11: 5 mg via ORAL
  Administered 2013-01-13 – 2013-01-14 (×3): 10 mg via ORAL
  Filled 2013-01-11: qty 1
  Filled 2013-01-11 (×4): qty 2

## 2013-01-11 MED ORDER — GADOBENATE DIMEGLUMINE 529 MG/ML IV SOLN
20.0000 mL | Freq: Once | INTRAVENOUS | Status: AC
  Administered 2013-01-11: 20 mL via INTRAVENOUS

## 2013-01-11 MED ORDER — MORPHINE SULFATE 4 MG/ML IJ SOLN
INTRAMUSCULAR | Status: AC
  Filled 2013-01-11: qty 1

## 2013-01-11 MED ORDER — TRAMADOL HCL 50 MG PO TABS
50.0000 mg | ORAL_TABLET | Freq: Four times a day (QID) | ORAL | Status: DC | PRN
  Administered 2013-01-11 – 2013-01-14 (×5): 50 mg via ORAL
  Filled 2013-01-11 (×5): qty 1

## 2013-01-11 MED ORDER — VANCOMYCIN HCL 10 G IV SOLR
1250.0000 mg | Freq: Two times a day (BID) | INTRAVENOUS | Status: DC
  Administered 2013-01-11 – 2013-01-12 (×3): 1250 mg via INTRAVENOUS
  Filled 2013-01-11 (×4): qty 1250

## 2013-01-11 MED ORDER — SODIUM CHLORIDE 0.9 % IV SOLN
2.0000 g | INTRAVENOUS | Status: DC
  Administered 2013-01-11 – 2013-01-13 (×11): 2 g via INTRAVENOUS
  Filled 2013-01-11 (×16): qty 2000

## 2013-01-11 NOTE — Progress Notes (Signed)
Utilization Review Completed.Caroline Velez T11/05/2012  

## 2013-01-11 NOTE — Progress Notes (Signed)
Lab called with critical WBC on CSF fluid from earlier lumbar puncture. Tube #1- 3,280, and tube #4- 2,470. Dr. Sharon Seller paged.

## 2013-01-11 NOTE — Progress Notes (Addendum)
[facility] Progress Note [place] TEAM 1 - Stepdown/ICU TEAM   Caroline Velez JXB:[id number] DOB: [birth year] DOA: 01/10/2013 PCP: Avon Gully, MD  Admit HPI / Brief Narrative: 57 y.o. Female with no significant PMH who presented to the ED with a new onset headache that started the night prior to her admit. Pt was with family members in her usual state of health eating washed grapes when she later noted worsening headaches shortly thereafter. The patient went to bed, but was found to be more lethargic and altered on the morning of admission.The patient was then brought to the ED where she remained lethargic. The patient was found to be febrile with a temp of over 101 and a wbc of over 13K. A UA was unremarkable. A head CT demonstrated a L mastoid effusion but was neg for bleed. A bedside LP was attempted, but was unsuccessful.   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 She reports that she feels much better.  She does still have a HA.   She denies n/v, abdom pain, change in visual acuity, resp distress, or focal weakness.  She denies neck stiffness.  Objective: Blood pressure 118/60, pulse 82, temperature 98.7 F (37.1 C), temperature source Oral, resp. rate 19, height 5\' 2"  (1.575 m), weight 114.3 kg (251 lb 15.8  oz), SpO2 97.00%.  Intake/Output Summary (Last 24 hours) at 01/11/13 1515 Last data filed at 01/11/13 1000  Gross per 24 hour  Intake 679.25 ml  Output    575 ml  Net 104.25 ml   Exam: General: No acute respiratory distress Lungs: Clear to auscultation bilaterally without wheezes or crackles Cardiovascular: Regular rate and rhythm without murmur gallop or rub normal S1 and S2 Abdomen: Nontender, nondistended, soft, bowel sounds positive, no rebound, no ascites, no appreciable mass Extremities: No significant cyanosis, clubbing, or edema bilateral lower extremities  Data Reviewed: Basic Metabolic Panel:  Recent Labs Lab 01/10/13 1133  NA 134*  K 3.7  CL 99  CO2 25  GLUCOSE 151*  BUN 10  CREATININE 0.91  CALCIUM 9.6   Liver Function Tests:  Recent Labs Lab 01/10/13 1133  AST 20  ALT 14  ALKPHOS 140*  BILITOT 0.3  PROT 8.2  ALBUMIN 3.8   CBC:  Recent Labs Lab 01/10/13 1133  WBC 13.8*  NEUTROABS 11.4*  HGB 12.8  HCT 38.7  MCV 88.8  PLT 317   CBG:  Recent Labs Lab 01/10/13 1149  GLUCAP 127*    Recent Results (from the past 240 hour(s))  CULTURE, BLOOD (ROUTINE X 2)     Status: None   Collection Time    01/10/13 11:30 AM      Result Value Range Status   Specimen Description     Final   Value: BLOOD BLOOD RIGHT ARM BOTTLES DRAWN AEROBIC AND  ANAEROBIC   Special Requests 8CC   Final   Culture NO GROWTH 1 DAY   Final   Report Status PENDING   Incomplete  CULTURE, BLOOD (ROUTINE X 2)     Status: None   Collection Time    01/10/13 11:40 AM      Result Value Range Status   Specimen Description BLOOD BLOOD LEFT HAND BOTTLES DRAWN AEROBIC ONLY   Final   Special Requests 8CC   Final   Culture NO GROWTH 1 DAY   Final   Report Status PENDING   Incomplete  MRSA PCR SCREENING     Status: None   Collection Time    01/10/13  6:21 PM      Result Value Range Status   MRSA by PCR NEGATIVE  NEGATIVE Final   Comment:            The GeneXpert MRSA Assay (FDA      approved for NASAL specimens     only), is one component of a     comprehensive MRSA colonization     surveillance program. It is not     intended to diagnose MRSA     infection nor to guide or     monitor treatment for     MRSA infections.     Studies:  Recent x-ray studies have been reviewed in detail by the Attending Physician  Scheduled Meds:  Scheduled Meds: . acyclovir  500 mg Intravenous Q8H  . ampicillin (OMNIPEN) IV  2 g Intravenous Q4H  . cefTRIAXone (ROCEPHIN)  IV  2 g Intravenous Q12H  . morphine      . sodium chloride  3 mL Intravenous Q12H  . vancomycin  1,250 mg Intravenous Q12H    Time spent on care of this patient: 35 mins   [facility] T  [facility] Office  [phone] Pager - Text Page per Loretha Stapler as per below:  On-Call/Text Page:      [email or web address]      password [email or web address]  If 7PM-7AM, please contact night-coverage [email or web address] Password [email or web address] 01/11/2013, 3:15 PM   LOS: 1 day

## 2013-01-12 DIAGNOSIS — R7881 Bacteremia: Secondary | ICD-10-CM

## 2013-01-12 DIAGNOSIS — Z113 Encounter for screening for infections with a predominantly sexual mode of transmission: Secondary | ICD-10-CM

## 2013-01-12 DIAGNOSIS — R51 Headache: Secondary | ICD-10-CM

## 2013-01-12 DIAGNOSIS — G01 Meningitis in bacterial diseases classified elsewhere: Secondary | ICD-10-CM

## 2013-01-12 DIAGNOSIS — A329 Listeriosis, unspecified: Secondary | ICD-10-CM

## 2013-01-12 DIAGNOSIS — R4182 Altered mental status, unspecified: Secondary | ICD-10-CM

## 2013-01-12 DIAGNOSIS — A3212 Listerial meningoencephalitis: Secondary | ICD-10-CM | POA: Diagnosis present

## 2013-01-12 DIAGNOSIS — A419 Sepsis, unspecified organism: Secondary | ICD-10-CM

## 2013-01-12 DIAGNOSIS — G009 Bacterial meningitis, unspecified: Secondary | ICD-10-CM

## 2013-01-12 LAB — HERPES SIMPLEX VIRUS(HSV) DNA BY PCR: HSV 1 DNA: NOT DETECTED

## 2013-01-12 LAB — COMPREHENSIVE METABOLIC PANEL
AST: 13 U/L (ref 0–37)
Albumin: 3 g/dL — ABNORMAL LOW (ref 3.5–5.2)
BUN: 17 mg/dL (ref 6–23)
Calcium: 8.8 mg/dL (ref 8.4–10.5)
Chloride: 104 mEq/L (ref 96–112)
Creatinine, Ser: 0.89 mg/dL (ref 0.50–1.10)
Glucose, Bld: 85 mg/dL (ref 70–99)
Total Bilirubin: 0.2 mg/dL — ABNORMAL LOW (ref 0.3–1.2)
Total Protein: 7.4 g/dL (ref 6.0–8.3)

## 2013-01-12 LAB — CBC
HCT: 34.7 % — ABNORMAL LOW (ref 36.0–46.0)
Hemoglobin: 11.5 g/dL — ABNORMAL LOW (ref 12.0–15.0)
MCH: 29.3 pg (ref 26.0–34.0)
MCHC: 33.1 g/dL (ref 30.0–36.0)
MCV: 88.3 fL (ref 78.0–100.0)
Platelets: 296 10*3/uL (ref 150–400)
RDW: 14.5 % (ref 11.5–15.5)
WBC: 11.3 10*3/uL — ABNORMAL HIGH (ref 4.0–10.5)

## 2013-01-12 LAB — PATHOLOGIST SMEAR REVIEW

## 2013-01-12 MED ORDER — GENTAMICIN SULFATE 40 MG/ML IJ SOLN
7.0000 mg/kg | Freq: Once | INTRAVENOUS | Status: AC
  Administered 2013-01-12: 530 mg via INTRAVENOUS
  Filled 2013-01-12: qty 13.25

## 2013-01-12 MED ORDER — KETOROLAC TROMETHAMINE 30 MG/ML IJ SOLN
30.0000 mg | Freq: Four times a day (QID) | INTRAMUSCULAR | Status: DC
  Administered 2013-01-12 – 2013-01-14 (×9): 30 mg via INTRAVENOUS
  Filled 2013-01-12 (×12): qty 1

## 2013-01-12 MED ORDER — FAMOTIDINE 20 MG PO TABS
20.0000 mg | ORAL_TABLET | Freq: Two times a day (BID) | ORAL | Status: DC
  Administered 2013-01-12 – 2013-01-14 (×4): 20 mg via ORAL
  Filled 2013-01-12 (×7): qty 1

## 2013-01-12 MED ORDER — BUDESONIDE-FORMOTEROL FUMARATE 160-4.5 MCG/ACT IN AERO
2.0000 | INHALATION_SPRAY | Freq: Two times a day (BID) | RESPIRATORY_TRACT | Status: DC
  Administered 2013-01-12: 2 via RESPIRATORY_TRACT
  Filled 2013-01-12: qty 6

## 2013-01-12 NOTE — Progress Notes (Signed)
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 bacteremia + rule out meningitis  No Known Allergies  Patient Measurements: Height: 5\' 2"  (157.5 cm) Weight: 251 lb 15.8 oz (114.3 kg) IBW/kg (Calculated) : 50.1 Adjusted body weight = 76 kg  Vital Signs: Temp: 99.1 F (37.3 C) (11/04 1445) Temp src: Oral (11/04 1445) BP: 118/50 mmHg (11/04 1445) Pulse Rate: 66 (11/04 1445) Intake/Output from previous day: 11/03 0701 - 11/04 0700 In: 1693 [P.O.:240; I.V.:803; IV Piggyback:650] Out: 1450 [Urine:1450] Intake/Output from this shift: Total I/O In: 350 [I.V.:300; IV Piggyback:50] Out: 300 [Urine:300]  Labs:  Recent Labs  01/10/13 1133 01/12/13 0411  WBC 13.8* 11.3*  HGB 12.8 11.5*  PLT 317 296  CREATININE 0.91 0.89   Estimated Creatinine Clearance: 84.5 ml/min (by C-G formula based on Cr of 0.89). No results found for this basename: VANCOTROUGH, Leodis Binet, VANCORANDOM, GENTTROUGH, GENTPEAK, GENTRANDOM, TOBRATROUGH, TOBRAPEAK, TOBRARND, AMIKACINPEAK, AMIKACINTROU, AMIKACIN,  in the last 72 hours   Microbiology: Recent Results (from the past 720 hour(s))  CULTURE, BLOOD (ROUTINE X 2)     Status: None   Collection Time    01/10/13 11:30 AM      Result Value Range Status   Specimen Description BLOOD RIGHT ARM   Final   Special Requests BOTTLES DRAWN AEROBIC AND ANAEROBIC 8CC   Final   Culture     Final   Value: GRAM NEGATIVE COCCOBACILLI     Gram Stain Report Called to,Read Back By and Verified With: LOVE,SHANNON [facility]) AT 1136 ON 01/12/2013 BY BAUGHAM,M.     Performed at [facility]   Report Status PENDING   Incomplete  CULTURE, BLOOD (ROUTINE X 2)     Status: None   Collection Time    01/10/13 11:40 AM      Result Value Range Status   Specimen Description BLOOD LEFT HAND   Final   Special Requests BOTTLES DRAWN AEROBIC ONLY 8CC   Final   Culture NO GROWTH 2 DAYS   Final   Report Status PENDING    Incomplete  MRSA PCR SCREENING     Status: None   Collection Time    01/10/13  6:21 PM      Result Value Range Status   MRSA by PCR NEGATIVE  NEGATIVE Final   Comment:            The GeneXpert MRSA Assay (FDA     approved for NASAL specimens     only), is one component of a     comprehensive MRSA colonization     surveillance program. It is not     intended to diagnose MRSA     infection nor to guide or     monitor treatment for     MRSA infections.  CSF CULTURE     Status: None   Collection Time    01/11/13  3:30 PM      Result Value Range Status   Specimen Description CSF   Final   Special Requests 3.0ML   Final   Gram Stain     Final   Value: CYTOSPIN SLIDE WBC PRESENT,BOTH PMN AND MONONUCLEAR     NO ORGANISMS SEEN     Performed at [facility]     Performed at [facility]   Culture     Final   Value: NO GROWTH     Performed at [facility]   Report Status PENDING  Incomplete  GRAM STAIN     Status: None   Collection Time    01/11/13  3:30 PM      Result Value Range Status   Specimen Description CSF   Final   Special Requests 3.   Final   Gram Stain     Final   Value: WBC PRESENT,BOTH PMN AND MONONUCLEAR     NO ORGANISMS SEEN     CYTOSPIN SLIDE   Report Status 01/11/2013 FINAL   Final  FUNGUS CULTURE W SMEAR     Status: None   Collection Time    01/11/13  3:30 PM      Result Value Range Status   Specimen Description CSF   Final   Special Requests 3.0ML FLUID   Final   Fungal Smear     Final   Value: NO YEAST OR FUNGAL ELEMENTS SEEN     Performed at [facility]   Culture     Final   Value: CULTURE IN PROGRESS FOR FOUR WEEKS     Performed at [facility]   Report Status PENDING   Incomplete  AFB CULTURE WITH SMEAR     Status: None   Collection Time    01/11/13  3:30 PM      Result Value Range Status   Specimen Description CSF   Final   Special Requests NO 2   Final   ACID FAST SMEAR     Final   Value: NO  ACID FAST BACILLI SEEN     Performed at [facility]   Culture     Final   Value: CULTURE WILL BE EXAMINED FOR 6 WEEKS BEFORE ISSUING A FINAL REPORT     Performed at [facility]   Report Status PENDING   Incomplete    Medical History: Past Medical History  Diagnosis Date  . Allergy        Assessment: 57 YOF transferred from [facility] for further management of possible meningitis.  Patient was started on Rocephin, vancomycin, ampicillin, and acyclovir.  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  Patient has stable renal function.  Will use extended interval and adjusted body weight for gentamicin dosing.  Rocephin 11/2 >> 11/4 Vanc 11/2 >> 11/4 Acyclovir 11/2 >> 11/4 Ampicillin 11/3 >> Natasha Bence 11/4 >>  11/2 BC x 2 - Gram negative coccobacilli (1 of 2) 11/3 CSF cx - NGTD LP - WBC seen   Goal of Therapy:  Appropriate dosing   Plan:  - Gentamicin 530mg  IV x 1 - Check 10-hr gentamicin level to guide further dosing based on [place] nomogram - Continue ampicillin 2gm IV Q4H as ordered - Monitor renal fxn, micro data, clinical course      Shamarcus Hoheisel D. Laney Potash, PharmD, BCPS Pager:  [phone] 01/12/2013, 5:02 PM

## 2013-01-12 NOTE — Progress Notes (Addendum)
[facility] Progress Note [place] TEAM 1 - Stepdown/ICU TEAM   Caroline Velez ZOX:[id number] DOB: November 30, 1955 DOA: 01/10/2013 PCP: Avon Gully, MD  Admit HPI / Brief Narrative: 57 y.o. Female with no significant PMH who presented to the ED with a new onset headache that started the night prior to her admit. Pt was with family members in her usual state of health eating washed grapes when she later noted worsening headaches shortly thereafter. The patient went to bed, but was found to be more lethargic and altered on the morning of admission.The patient was then brought to the ED where she remained lethargic. The patient was found to be febrile with a temp of over 101 and a wbc of over 13K. A UA was unremarkable. A head CT demonstrated a L mastoid effusion but was neg for bleed. A bedside LP was attempted, but was unsuccessful.   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  - Toradol for neck pain and headache- Pepcid for GI protection - Place PICC for long term antibiotics--spoke with Dr Orvan Falconer who suggests an ID consult - check HIV  Mild hyperglycemia Likely simply stress reaction - will check A1c as precaution   Code Status: FULL Family Communication: spoke w/ pt  Disposition Plan: transfer to med/surg  Consultants: IR  Procedures: LP - 11/3  Antibiotics: Acyclovir 11/2 >>11/4 Rocephin 11/2 >>11/4 Vanc 11/2 >>11/4 Ampicillin 11/3 >>  DVT prophylaxis: SCDs  HPI/Subjective: Pt is alert and oriented. Started having neck pain after the LP- mild headache- no nausea - ok with advancing diet.  Objective: Blood pressure 115/53, pulse 66,  temperature 98.9 F (37.2 C), temperature source Oral, resp. rate 22, height 5\' 2"  (1.575 m), weight 114.3 kg (251 lb 15.8 oz), SpO2 100.00%.  Intake/Output Summary (Last 24 hours) at 01/12/13 1441 Last data filed at 01/12/13 1200  Gross per 24 hour  Intake   1590 ml  Output   1350 ml  Net    240 ml   Exam: General: No acute respiratory distress Lungs: Clear to auscultation bilaterally without wheezes or crackles Cardiovascular: Regular rate and rhythm without murmur gallop or rub normal S1 and S2 Abdomen: Nontender, nondistended, soft, bowel sounds positive, no rebound, no ascites, no appreciable mass Extremities: No significant cyanosis, clubbing, or edema bilateral lower extremities  Data Reviewed: Basic Metabolic Panel:  Recent Labs Lab 01/10/13 1133 01/12/13 0411  NA 134* 138  K 3.7 4.0  CL 99 104  CO2 25 22  GLUCOSE 151* 85  BUN 10 17  CREATININE 0.91 0.89  CALCIUM 9.6 8.8   Liver Function Tests:  Recent Labs Lab 01/10/13 1133 01/12/13 0411  AST 20 13  ALT 14 9  ALKPHOS 140* 93  BILITOT 0.3 0.2*  PROT 8.2 7.4  ALBUMIN 3.8 3.0*   CBC:  Recent Labs Lab 01/10/13 1133 01/12/13 0411  WBC 13.8* 11.3*  NEUTROABS 11.4*  --   HGB 12.8 11.5*  HCT 38.7 34.7*  MCV 88.8 88.3  PLT 317 296   CBG:  Recent Labs Lab 01/10/13 1149  GLUCAP 127*    Recent Results (from the past 240 hour(s))  CULTURE, BLOOD (ROUTINE X  2)     Status: None   Collection Time    01/10/13 11:30 AM      Result Value Range Status   Specimen Description BLOOD RIGHT ARM   Final   Special Requests BOTTLES DRAWN AEROBIC AND ANAEROBIC 8CC   Final   Culture     Final   Value: GRAM NEGATIVE COCCOBACILLI     Gram Stain Report Called to,Read Back By and Verified With: LOVE,SHANNON [facility]) AT 1136 ON 01/12/2013 BY BAUGHAM,M.     Performed at [facility]   Report Status PENDING   Incomplete  CULTURE, BLOOD (ROUTINE X 2)     Status: None   Collection Time    01/10/13 11:40 AM       Result Value Range Status   Specimen Description BLOOD LEFT HAND   Final   Special Requests BOTTLES DRAWN AEROBIC ONLY 8CC   Final   Culture NO GROWTH 2 DAYS   Final   Report Status PENDING   Incomplete  MRSA PCR SCREENING     Status: None   Collection Time    01/10/13  6:21 PM      Result Value Range Status   MRSA by PCR NEGATIVE  NEGATIVE Final   Comment:            The GeneXpert MRSA Assay (FDA     approved for NASAL specimens     only), is one component of a     comprehensive MRSA colonization     surveillance program. It is not     intended to diagnose MRSA     infection nor to guide or     monitor treatment for     MRSA infections.  CSF CULTURE     Status: None   Collection Time    01/11/13  3:30 PM      Result Value Range Status   Specimen Description CSF   Final   Special Requests 3.0ML   Final   Gram Stain     Final   Value: CYTOSPIN SLIDE WBC PRESENT,BOTH PMN AND MONONUCLEAR     NO ORGANISMS SEEN     Performed at [facility]     Performed at [facility]   Culture     Final   Value: NO GROWTH     Performed at [facility]   Report Status PENDING   Incomplete  GRAM STAIN     Status: None   Collection Time    01/11/13  3:30 PM      Result Value Range Status   Specimen Description CSF   Final   Special Requests 3.   Final   Gram Stain     Final   Value: WBC PRESENT,BOTH PMN AND MONONUCLEAR     NO ORGANISMS SEEN     CYTOSPIN SLIDE   Report Status 01/11/2013 FINAL   Final  FUNGUS CULTURE W SMEAR     Status: None   Collection Time    01/11/13  3:30 PM      Result Value Range Status   Specimen Description CSF   Final   Special Requests 3.0ML FLUID   Final   Fungal Smear     Final   Value: NO YEAST OR FUNGAL ELEMENTS SEEN     Performed at [facility]   Culture     Final   Value: CULTURE IN PROGRESS FOR FOUR WEEKS     Performed at [facility]  Report Status PENDING   Incomplete     Studies:  Recent  x-ray studies have been reviewed in detail by the Attending Physician  Scheduled Meds:  Scheduled Meds: . acyclovir  500 mg Intravenous Q8H  . ampicillin (OMNIPEN) IV  2 g Intravenous Q4H  . cefTRIAXone (ROCEPHIN)  IV  2 g Intravenous Q12H  . famotidine  20 mg Oral BID  . ketorolac  30 mg Intravenous Q6H  . sodium chloride  3 mL Intravenous Q12H  . vancomycin  1,250 mg Intravenous Q12H    Time spent on care of this patient: 35 mins   Calvert Cantor, MD  [facility] Office  [phone] Pager - Text Page per Loretha Stapler as per below:  On-Call/Text Page:      [email or web address]      password [email or web address]  If 7PM-7AM, please contact night-coverage [email or web address] Password [email or web address] 01/12/2013, 2:41 PM   LOS: 2 days

## 2013-01-12 NOTE — Progress Notes (Signed)
CRITICAL VALUE ALERT  Critical value received:  Positive blood culture - gram neg coccobacilli  Date of notification:  01/12/13  Time of notification:  1136  Critical value read back:yes  Nurse who received alert:  Roselie Awkward, RN  MD notified (1st page):  Dr. Butler Denmark  Time of first page:  1140  MD notified (2nd page):  Time of second page:  Responding MD:  Dr. Butler Denmark  Time MD responded:  1140

## 2013-01-12 NOTE — Consult Note (Signed)
[facility]    Date of Admission:  01/10/2013  Date of Consult:  01/12/2013  Reason for Consult: Listeria bacteremia and meningitis Referring Physician: Dr. Butler Denmark   HPI: Caroline Velez is an 57 y.o. female with PMHX of breast cancer who had onset of severe vomiting, HA on "Sunday November 2nd who then became obtunded. Her daughter 911 and she was brought to the emergency department at [facility]. She was found to be febrile to 101 there was concern for meningitis. At bedside lumbar puncture was attempted but was unsuccessful. She was a bit admitted to the hospitalist service and start antibiotics for possible bacterial meningitis on November the 2nd.  Blood cultures had been obtained prior to initiation of antibiotics. MRI showed Abnormal signal in the subarachnoid space on FLAIR imaging suggestive of meningitis She'll  underwent lumbar puncture via radiology guidance on November 3 which showed 3280 white blood cells on lumbar puncture with 85% segmented neutrophils. No organisms were showing on Gram stain at that time more than 24 hours on antibiotics. 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 The patient does recall eating food that she had left in the refrigerator for approximately 4 days. She states that she largely eats salads prepared from home and does not typically eat out. She has no known hx of immune deficiency.   Past Medical History  Diagnosis Date  . Allergy     Past Surgical History  Procedure Laterality Date  . Breast surgery      bilateral  ergies:   No Known Allergies   Medications: I have reviewed patients current medications as documented in Epic Anti-infectives   Start     Dose/Rate Route Frequency Ordered Stop   01/12/13 1800  gentamicin (GARAMYCIN) 530 mg in dextrose 5 % 100 mL IVPB     7 mg/kg  75.8 kg (Adjusted) 113.3 mL/hr over 60 Minutes Intravenous  Once 01/12/13 1704     01/11/13 1600  ampicillin (OMNIPEN) 2 g in sodium chloride 0.9 % 50 mL IVPB     2 g 150 mL/hr over 20 Minutes Intravenous 6 times per day 01/11/13 1514     01/11/13 1400  vancomycin (VANCOCIN) 1,250 mg in sodium chloride 0.9 % 250 mL IVPB  Status:  Discontinued     1,250 mg 166.7 mL/hr over 90 Minutes Intravenous Every 12 hours 01/11/13 0356 01/12/13 1444   01/11/13 0400  vancomycin (VANCOCIN) 1,250 mg in sodium chloride 0.9 % 250 mL IVPB  Status:  Discontinued     1,250 mg 166.7 mL/hr over 90 Minutes Intravenous Every 12 hours 01/10/13 1445 01/11/13 0356   01/11/13 0000  cefTRIAXone (ROCEPHIN) 2 g in dextrose 5 % 50 mL IVPB  Status:  Discontinued     2 g 100 mL/hr over 30 Minutes Intravenous Every 12 hours 01/10/13 1443 01/12/13 1444   01/10/13 2100  doxycycline (VIBRA-TABS) tablet 100 mg  Status:  Discontinued     10" 0 mg Oral  Once 01/10/13 1646 01/10/13 1648   01/10/13 2100  sulfamethoxazole-trimethoprim (BACTRIM DS) 800-160 MG per tablet 1 tablet  Status:  Discontinued     1 tablet Oral  Once 01/10/13 1646 01/10/13 1648   01/10/13 1600  vancomycin (VANCOCIN) 2,000 mg in sodium chloride 0.9 % 500 mL IVPB     2,000 mg 250 mL/hr over 120 Minutes Intravenous  Once 01/10/13 1444 01/11/13 0429   01/10/13 1500  acyclovir (ZOVIRAX) 600 mg  in dextrose 5 % 100 mL IVPB  Status:  Discontinued     600 mg 112 mL/hr over 60 Minutes Intravenous Every 8 hours 01/10/13 1431 01/10/13 1442   01/10/13 1500  acyclovir (ZOVIRAX) 500 mg in dextrose 5 % 100 mL IVPB  Status:  Discontinued     500 mg 110 mL/hr over 60 Minutes Intravenous Every 8 hours 01/10/13 1442 01/12/13 1444   01/10/13 1115  cefTRIAXone (ROCEPHIN) 2 g in dextrose 5 % 50 mL IVPB     2 g 100 mL/hr over 30 Minutes Intravenous  Once 01/10/13 1110 01/10/13 1258   01/10/13 1115  vancomycin (VANCOCIN) IVPB 1000 mg/200 mL premix     1,000 mg 200 mL/hr over 60 Minutes Intravenous  Once 01/10/13 1110 01/10/13 1403      Social History:  reports  that she has never smoked. She does not have any smokeless tobacco history on file. She reports that she does not drink alcohol or use illicit drugs.  Family History  Problem Relation Age of Onset  . Cancer      As in HPI and primary teams notes otherwise 12 point review of systems is negative  Blood pressure 118/50, pulse 66, temperature 99.1 F (37.3 C), temperature source Oral, resp. rate 20, height 5\' 2"  (1.575 m), weight 251 lb 15.8 oz (114.3 kg), SpO2 98.00%. General: Alert and awake, oriented x3, not in any acute distress. HEENT: anicteric sclera, pupils reactive to light and accommodation, EOMI, oropharynx clear and without exudate CVS regular rate, normal r,  no murmur rubs or gallops Chest: clear to auscultation bilaterally, no wheezing, rales or rhonchi Abdomen: soft nontender, nondistended, normal bowel sounds, Extremities: no  clubbing or edema noted bilaterally Skin: no rashes Neuro: nonfocal, strength and sensation intact   Results for orders placed during the hospital encounter of 01/10/13 (from the past 48 hour(s))  MRSA PCR SCREENING     Status: None   Collection Time    01/10/13  6:21 PM      Result Value Range   MRSA by PCR NEGATIVE  NEGATIVE   Comment:            The GeneXpert MRSA Assay (FDA     approved for NASAL specimens     only), is one component of a     comprehensive MRSA colonization     surveillance program. It is not     intended to diagnose MRSA     infection nor to guide or     monitor treatment for     MRSA infections.  PROTEIN AND GLUCOSE, CSF     Status: Abnormal   Collection Time    01/11/13  3:30 PM      Result Value Range   Glucose, CSF 26 (*) 43 - 76 mg/dL   Total  Protein, CSF 295 (*) 15 - 45 mg/dL  CSF CULTURE     Status: None   Collection Time    01/11/13  3:30 PM      Result Value Range   Specimen Description CSF     Special Requests 3.0ML     Gram Stain       Value: CYTOSPIN SLIDE WBC PRESENT,BOTH PMN AND MONONUCLEAR      NO ORGANISMS SEEN     Performed at [facility]     Performed at [facility]   Culture       Value: NO GROWTH     Performed at [facility]  Report Status PENDING    GRAM STAIN     Status: None   Collection Time    01/11/13  3:30 PM      Result Value Range   Specimen Description CSF     Special Requests 3.     Gram Stain       Value: WBC PRESENT,BOTH PMN AND MONONUCLEAR     NO ORGANISMS SEEN     CYTOSPIN SLIDE   Report Status 01/11/2013 FINAL    CSF CELL COUNT WITH DIFFERENTIAL     Status: Abnormal   Collection Time    01/11/13  3:30 PM      Result Value Range   Tube # 1     Color, CSF STRAW (*) COLORLESS   Appearance, CSF HAZY (*) CLEAR   Supernatant COLORLESS     RBC Count, CSF 100 (*) 0 /cu mm   WBC, CSF 3280 (*) 0 - 5 /cu mm   Comment: CRITICAL RESULT CALLED TO, READ BACK BY AND VERIFIED WITH:     D FIELDS,RN 1744 01/11/13 D BRADLEY   Segmented Neutrophils-CSF 85 (*) 0 - 6 %   Lymphs, CSF 12 (*) 40 - 80 %   Monocyte-Macrophage-Spinal Fluid 3 (*) 15 - 45 %   Eosinophils, CSF 0  0 - 1 %  CSF CELL COUNT WITH DIFFERENTIAL     Status: Abnormal   Collection Time    01/11/13  3:30 PM      Result Value Range   Tube # 4     Color, CSF STRAW (*) COLORLESS   Appearance, CSF HAZY (*) CLEAR   Supernatant COLORLESS     RBC Count, CSF 68 (*) 0 /cu mm   WBC, CSF 2470 (*) 0 - 5 /cu mm   Comment: CRITICAL RESULT CALLED TO, READ BACK BY AND VERIFIED WITH:     D FIELDS,RN 1744 01/11/13 D BRADLEY   Segmented Neutrophils-CSF 87 (*) 0 - 6 %   Lymphs, CSF 8 (*) 40 - 80 %   Monocyte-Macrophage-Spinal Fluid 5 (*) 15 - 45 %   Eosinophils, CSF 0  0 - 1 %  FUNGUS CULTURE W SMEAR     Status: None   Collection Time    01/11/13  3:30 PM      Result Value Range   Specimen Description CSF     Special Requests 3.0ML FLUID     Fungal Smear       Value: NO YEAST OR FUNGAL ELEMENTS SEEN     Performed at [facility]   Culture       Value: CULTURE IN  PROGRESS FOR FOUR WEEKS     Performed at [facility]   Report Status PENDING    AFB CULTURE WITH SMEAR     Status: None   Collection Time    01/11/13  3:30 PM      Result Value Range   Specimen Description CSF     Special Requests NO 2     ACID FAST SMEAR       Value: NO ACID FAST BACILLI SEEN     Performed at [facility]   Culture       Value: CULTURE WILL BE EXAMINED FOR 6 WEEKS BEFORE ISSUING A FINAL REPORT     Performed at [facility]   Report Status PENDING    HERPES SIMPLEX VIRUS(HSV) DNA BY PCR     Status: None   Collection Time  01/11/13  3:30 PM      Result Value Range   Specimen source hsv CSF     HSV 1 DNA Not Detected  Not Detected   HSV 2 DNA Not Detected  Not Detected   Comment: (NOTE)     Note:     This assay detects the presence of herpes simplex virus (HSV) DNA by     real-time polymerase chain reaction (PCR) amplification of the virus     polymerase gene.  A result "DETECTED" is reported if a fluorescent     signal for HSV DNA is detected. If HSV DNA is present, the virus is     further characterized into subtype 1 or subtype 2 according     type-specific differences in melting temperature. The assay is     performed in the presence of an internal PCR control to ensure     efficient sample extraction and the absence of PCR inhibitors in the     sample.     This test was developed and its performance characteristics have been     determined by [facility]. Performance characteristics refer     to the analytical performance of the test. This test has not been     cleared or approved by the [place] Food and Drug Administration. The FDA     has determined that such clearance or approval is not necessary. This     laboratory is certified under the Clinical Laboratory Improvement     Amendments of 1988 as qualified to perform high complexity clinical     laboratory testing.     Performed at [facility]  PATHOLOGIST SMEAR  REVIEW     Status: None   Collection Time    01/11/13  3:30 PM      Result Value Range   Path Review Abundant White blood cells     Comment: with a predominance of neutrophils.     No malignant cells.     Reviewed by [place] R. Rund, M.D.     01/12/13  HEMOGLOBIN A1C     Status: Abnormal   Collection Time    01/12/13  4:11 AM      Result Value Range   Hemoglobin A1C 6.3 (*) <5.7 %   Comment: (NOTE)                                                                               According to the ADA Clinical Practice Recommendations for 2011, when     HbA1c is used as a screening test:      >=6.5%   Diagnostic of Diabetes Mellitus               (if abnormal result is confirmed)     5.7-6.4%   Increased risk of developing Diabetes Mellitus     References:Diagnosis and Classification of Diabetes Mellitus,Diabetes     Care,2011,34(Suppl 1):S62-S69 and Standards of Medical Care in             Diabetes - 2011,Diabetes Care,2011,34 (Suppl 1):S11-S61.   Mean Plasma Glucose 134 (*) <117 mg/dL   Comment: Performed at [facility]  CBC  Status: Abnormal   Collection Time    01/12/13  4:11 AM      Result Value Range   WBC 11.3 (*) 4.0 - 10.5 K/uL   RBC 3.93  3.87 - 5.11 MIL/uL   Hemoglobin 11.5 (*) 12.0 - 15.0 g/dL   HCT 16.1 (*) 09.6 - 04.5 %   MCV 88.3  78.0 - 100.0 fL   MCH 29.3  26.0 - 34.0 pg   MCHC 33.1  30.0 - 36.0 g/dL   RDW 40.9  81.1 - 91.4 %   Platelets 296  150 - 400 K/uL  COMPREHENSIVE METABOLIC PANEL     Status: Abnormal   Collection Time    01/12/13  4:11 AM      Result Value Range   Sodium 138  135 - 145 mEq/L   Potassium 4.0  3.5 - 5.1 mEq/L   Chloride 104  96 - 112 mEq/L   CO2 22  19 - 32 mEq/L   Glucose, Bld 85  70 - 99 mg/dL   BUN 17  6 - 23 mg/dL   Creatinine, Ser 7.82  0.50 - 1.10 mg/dL   Calcium 8.8  8.4 - 95.6 mg/dL   Total Protein 7.4  6.0 - 8.3 g/dL   Albumin 3.0 (*) 3.5 - 5.2 g/dL   AST 13  0 - 37 U/L   ALT 9  0 - 35 U/L   Alkaline Phosphatase  93  39 - 117 U/L   Total Bilirubin 0.2 (*) 0.3 - 1.2 mg/dL   GFR calc non Af Amer 71 (*) >90 mL/min   GFR calc Af Amer 82 (*) >90 mL/min   Comment: (NOTE)     The eGFR has been calculated using the CKD EPI equation.     This calculation has not been validated in all clinical situations.     eGFR's persistently <90 mL/min signify possible Chronic Kidney     Disease.      Component Value Date/Time   SDES CSF 01/11/2013 1530   SDES CSF 01/11/2013 1530   SDES CSF 01/11/2013 1530   SDES CSF 01/11/2013 1530   SPECREQUEST 3.0ML 01/11/2013 1530   SPECREQUEST 3.0MLFLUID 01/11/2013 1530   SPECREQUEST 3.0ML FLUID 01/11/2013 1530   SPECREQUEST NO 2 01/11/2013 1530   CULT  Value: NO GROWTH Performed at [facility] 01/11/2013 1530   CULT  Value: CULTURE IN PROGRESS FOR FOUR WEEKS Performed at [facility] 01/11/2013 1530   CULT  Value: CULTURE WILL BE EXAMINED FOR 6 WEEKS BEFORE ISSUING A FINAL REPORT Performed at [facility] 01/11/2013 1530   REPTSTATUS PENDING 01/11/2013 1530   REPTSTATUS 01/11/2013 FINAL 01/11/2013 1530   REPTSTATUS PENDING 01/11/2013 1530   REPTSTATUS PENDING 01/11/2013 1530   Mr Brain W Wo Contrast  01/11/2013   CLINICAL DATA:  Fever and headache. Altered mental status.  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  Ventricle size is normal. Negative for acute or chronic infarct. Cerebral white matter is normal.  Abnormal signal in the subarachnoid space on FLAIR imaging suggestive of meningitis. There is also prominent enhancement of the cerebral vessels following contrast administration suggesting  meningitis. No subdural  effusion. Negative for brain abscess. No focal brain edema negative for intracranial hemorrhage.  Mild mucosal edema in the paranasal sinuses. Left mastoid sinus effusion.  IMPRESSION: Findings are suggestive of meningitis. Lumbar puncture and CSF sampling are suggested.  Negative for abscess or fluid collection. No acute infarct.  Chiari malformation.   Electronically Signed   By: Marlan Palau M.D.   On: 01/11/2013 12:36   Dg Fluoro Guide Lumbar Puncture  01/11/2013   CLINICAL DATA:  Meningitis.  EXAM: DIAGNOSTIC LUMBAR PUNCTURE UNDER FLUOROSCOPIC GUIDANCE  FLUOROSCOPY TIME:  1 min and 1 2nd.  PROCEDURE: Informed consent was obtained from the patient prior to the procedure, including potential complications of headache, allergy, and pain. I also discussed the patient's Chiari malformation and the slight higher risk of headache. With the patient prone, the lower back was prepped with Betadine. 1% Lidocaine was used for local anesthesia. Lumbar puncture was performed at the L4-5 level using a gauge needle with return of somewhat cloudy CSF with an opening pressure of cm water. Elevenml of CSF were obtained for laboratory studies. The patient tolerated the procedure well and there were no apparent complications.  IMPRESSION: Fluoroscopic guided lumbar puncture with 11 cc of slightly cloudy CSF obtained for appropriate laboratory evaluation. Patient tolerated procedure well without immediate complications.   Electronically Signed   By: Loralie Champagne M.D.   On: 01/11/2013 17:00     Recent Results (from the past 720 hour(s))  CULTURE, BLOOD (ROUTINE X 2)     Status: None   Collection Time    01/10/13 11:30 AM      Result Value Range Status   Specimen Description BLOOD RIGHT ARM   Final   Special Requests BOTTLES DRAWN AEROBIC AND ANAEROBIC 8CC   Final   Culture     Final   Value: GRAM NEGATIVE COCCOBACILLI     Gram Stain Report Called to,Read Back By and Verified With:  LOVE,SHANNON [facility]) AT 1136 ON 01/12/2013 BY BAUGHAM,M.     Performed at [facility]   Report Status PENDING   Incomplete  CULTURE, BLOOD (ROUTINE X 2)     Status: None   Collection Time    01/10/13 11:40 AM      Result Value Range Status   Specimen Description BLOOD LEFT HAND   Final   Special Requests BOTTLES DRAWN AEROBIC ONLY 8CC   Final   Culture NO GROWTH 2 DAYS   Final   Report Status PENDING   Incomplete  MRSA PCR SCREENING     Status: None   Collection Time    01/10/13  6:21 PM      Result Value Range Status   MRSA by PCR NEGATIVE  NEGATIVE Final   Comment:            The GeneXpert MRSA Assay (FDA     approved for NASAL specimens     only), is one component of a     comprehensive MRSA colonization     surveillance program. It is not     intended to diagnose MRSA     infection nor to guide or     monitor treatment for     MRSA infections.  CSF CULTURE     Status: None   Collection Time    01/11/13  3:30 PM      Result Value Range Status   Specimen Description CSF   Final   Special Requests 3.0ML   Final   Gram Stain  Final   Value: CYTOSPIN SLIDE WBC PRESENT,BOTH PMN AND MONONUCLEAR     NO ORGANISMS SEEN     Performed at [facility]     Performed at [facility]   Culture     Final   Value: NO GROWTH     Performed at [facility]   Report Status PENDING   Incomplete  GRAM STAIN     Status: None   Collection Time    01/11/13  3:30 PM      Result Value Range Status   Specimen Description CSF   Final   Special Requests 3.   Final   Gram Stain     Final   Value: WBC PRESENT,BOTH PMN AND MONONUCLEAR     NO ORGANISMS SEEN     CYTOSPIN SLIDE   Report Status 01/11/2013 FINAL   Final  FUNGUS CULTURE W SMEAR     Status: None   Collection Time    01/11/13  3:30 PM      Result Value Range Status   Specimen Description CSF   Final   Special Requests 3.0ML FLUID   Final   Fungal Smear     Final   Value: NO YEAST OR FUNGAL  ELEMENTS SEEN     Performed at [facility]   Culture     Final   Value: CULTURE IN PROGRESS FOR FOUR WEEKS     Performed at [facility]   Report Status PENDING   Incomplete  AFB CULTURE WITH SMEAR     Status: None   Collection Time    01/11/13  3:30 PM      Result Value Range Status   Specimen Description CSF   Final   Special Requests NO 2   Final   ACID FAST SMEAR     Final   Value: NO ACID FAST BACILLI SEEN     Performed at [facility]   Culture     Final   Value: CULTURE WILL BE EXAMINED FOR 6 WEEKS BEFORE ISSUING A FINAL REPORT     Performed at [facility]   Report Status PENDING   Incomplete     Impression/Recommendation  57 year old lady with admission to the hospital with apparent listeria bacteremia and meningitis  #1 Listeria bacteremia and meningitis: --Continue high-dose ampicillin and add gentamicin.  --Recheck blood cultures --Once blood cultures are been proven to be clear we'll place PICC line and will give the patient a 21 day course of combination ampicillin and gentamicin therapy.  --She will need to have a renal function monitored very closely while on aminoglycoside therapy.  --Check HIV  I spent greater than 60 minutes with the patient including greater than 50% of time in face to face counsel of the patient and in coordination of their care.    #2 Screening: check HIV and also Hep panel  Thank you so much for this interesting consult  [facility] [phone] (pager) [phone] (office) 01/12/2013, 5:25 PM  Paulette Blanch Dam 01/12/2013, 5:25 PM

## 2013-01-13 DIAGNOSIS — G000 Hemophilus meningitis: Secondary | ICD-10-CM

## 2013-01-13 DIAGNOSIS — A492 Hemophilus influenzae infection, unspecified site: Secondary | ICD-10-CM

## 2013-01-13 LAB — COMPREHENSIVE METABOLIC PANEL WITH GFR
ALT: 9 U/L (ref 0–35)
AST: 11 U/L (ref 0–37)
Albumin: 2.9 g/dL — ABNORMAL LOW (ref 3.5–5.2)
Alkaline Phosphatase: 90 U/L (ref 39–117)
BUN: 13 mg/dL (ref 6–23)
CO2: 25 meq/L (ref 19–32)
Calcium: 8.7 mg/dL (ref 8.4–10.5)
Chloride: 104 meq/L (ref 96–112)
Creatinine, Ser: 0.88 mg/dL (ref 0.50–1.10)
GFR calc Af Amer: 84 mL/min — ABNORMAL LOW
GFR calc non Af Amer: 72 mL/min — ABNORMAL LOW
Glucose, Bld: 85 mg/dL (ref 70–99)
Potassium: 3.9 meq/L (ref 3.5–5.1)
Sodium: 139 meq/L (ref 135–145)
Total Bilirubin: 0.3 mg/dL (ref 0.3–1.2)
Total Protein: 7 g/dL (ref 6.0–8.3)

## 2013-01-13 LAB — CBC
HCT: 34.3 % — ABNORMAL LOW (ref 36.0–46.0)
Hemoglobin: 11.3 g/dL — ABNORMAL LOW (ref 12.0–15.0)
MCH: 29.4 pg (ref 26.0–34.0)
MCHC: 32.9 g/dL (ref 30.0–36.0)
MCV: 89.1 fL (ref 78.0–100.0)
Platelets: 288 K/uL (ref 150–400)
RBC: 3.85 MIL/uL — ABNORMAL LOW (ref 3.87–5.11)
RDW: 14.6 % (ref 11.5–15.5)
WBC: 7.5 K/uL (ref 4.0–10.5)

## 2013-01-13 LAB — VDRL, CSF: VDRL Quant, CSF: NONREACTIVE

## 2013-01-13 LAB — HIV ANTIBODY (ROUTINE TESTING W REFLEX)
HIV: NONREACTIVE
HIV: NONREACTIVE

## 2013-01-13 LAB — GENTAMICIN LEVEL, RANDOM: Gentamicin Rm: 4.7 ug/mL

## 2013-01-13 LAB — HEPATITIS PANEL, ACUTE: Hep B C IgM: NONREACTIVE

## 2013-01-13 MED ORDER — SODIUM CHLORIDE 0.9 % IV SOLN: 2.0000 g | INTRAVENOUS | Status: DC

## 2013-01-13 MED ORDER — GENTAMICIN SULFATE 40 MG/ML IJ SOLN: 7.0000 mg/kg | Freq: Once | INTRAMUSCULAR | Status: DC

## 2013-01-13 MED ORDER — GENTAMICIN SULFATE 40 MG/ML IJ SOLN: 7.0000 mg/kg | Freq: Once | INTRAVENOUS | Status: DC

## 2013-01-13 MED ORDER — TRAMADOL HCL 50 MG PO TABS: 50.0000 mg | ORAL_TABLET | Freq: Four times a day (QID) | ORAL | Status: DC | PRN

## 2013-01-13 MED ORDER — SODIUM CHLORIDE 0.9 % IJ SOLN
10.0000 mL | INTRAMUSCULAR | Status: DC | PRN
  Administered 2013-01-14: 10 mL

## 2013-01-13 MED ORDER — GENTAMICIN SULFATE 40 MG/ML IJ SOLN
530.0000 mg | INTRAVENOUS | Status: DC
  Filled 2013-01-13: qty 13.25

## 2013-01-13 MED ORDER — DEXTROSE 5 % IV SOLN
2.0000 g | Freq: Two times a day (BID) | INTRAVENOUS | Status: DC
  Administered 2013-01-13 – 2013-01-14 (×3): 2 g via INTRAVENOUS
  Filled 2013-01-13 (×4): qty 2

## 2013-01-13 NOTE — Progress Notes (Signed)
[facility]  Patient Status:   New patient for [facility] this admission.  [facility] is providing the following services:   HHRN, Home Infusion Pharmacy services for home IV antibiotics.  [facility] hospital team will follow and support transition to home when deemed appropriate by MD team.  If patient discharges after hours, please call [phone].   Sedalia Muta 01/13/2013, 11:12 AM

## 2013-01-13 NOTE — Progress Notes (Signed)
Peripherally Inserted Central Catheter/Midline Placement  The IV Nurse has discussed with the patient and/or persons authorized to consent for the patient, the purpose of this procedure and the potential benefits and risks involved with this procedure.  The benefits include less needle sticks, lab draws from the catheter and patient may be discharged home with the catheter.  Risks include, but not limited to, infection, bleeding, blood clot (thrombus formation), and puncture of an artery; nerve damage and irregular heat beat.  Alternatives to this procedure were also discussed.  PICC/Midline Placement Documentation        Lisabeth Devoid 01/13/2013, 12:48 PM Consent obtained by Merleen Milliner, RN, CRNI

## 2013-01-13 NOTE — Progress Notes (Signed)
[facility]  Day # 3 ampicillin Day #2 gentamicin  Subjective: No new complaints   Antibiotics:  Anti-infectives   Start     Dose/Rate Route Frequency Ordered Stop   01/13/13 2000  gentamicin (GARAMYCIN) 530 mg in dextrose 5 % 100 mL IVPB  Status:  Discontinued     530 mg 113.3 mL/hr over 60 Minutes Intravenous Every 24 hours 01/13/13 1216 01/13/13 1354   01/13/13 1500  cefTRIAXone (ROCEPHIN) 2 g in dextrose 5 % 50 mL IVPB     2 g 100 mL/hr over 30 Minutes Intravenous Every 12 hours 01/13/13 1350     01/13/13 1330  gentamicin (GARAMYCIN) 800 mg in dextrose 5 % 50 mL IVPB  Status:  Discontinued     7 mg/kg  114.3 kg 140 mL/hr over 30 Minutes Intravenous  Once 01/13/13 1322 01/13/13 1329   01/13/13 1215  gentamicin (GARAMYCIN) 530 mg in dextrose 5 % 100 mL IVPB  Status:  Discontinued     7 mg/kg  75.8 kg (Adjusted) 113.3 mL/hr over 60 Minutes Intravenous  Once 01/13/13 1204 01/13/13 1218   01/13/13 0000  sodium chloride 0.9 % SOLN 50 mL with ampicillin 2 G SOLR 2 g     2 g 150 mL/hr over 20 Minutes Intravenous Every 4 hours 01/13/13 1028     01/13/13 0000  dextrose 5 % SOLN 100 mL with gentamicin 40 MG/ML SOLN 530 mg    Comments:  530mg  injection to be given daily through 02/02/2013.   7 mg/kg  75.8 kg (Adjusted) 113.3 mL/hr over 60 Minutes Intravenous  Once 01/13/13 1214 02/02/13 2359   01/12/13 1800  gentamicin (GARAMYCIN) 530 mg in dextrose 5 % 100 mL IVPB     7 mg/kg  75.8 kg (Adjusted) 113.3 mL/hr over 60 Minutes Intravenous  Once 01/12/13 1704 01/12/13 2110   01/11/13 1600  ampicillin (OMNIPEN) 2 g in sodium chloride 0.9 % 50 mL IVPB  Status:  Discontinued     2 g 150 mL/hr over 20 Minutes Intravenous 6 times per day 01/11/13 1514 01/13/13 1354   01/11/13 1400  vancomycin (VANCOCIN) 1,250 mg in sodium chloride 0.9 % 250 mL IVPB  Status:  Discontinued     1,250 mg 166.7 mL/hr over 90 Minutes Intravenous Every 12 hours 01/11/13 0356 01/12/13  1444   01/11/13 0400  vancomycin (VANCOCIN) 1,250 mg in sodium chloride 0.9 % 250 mL IVPB  Status:  Discontinued     1,250 mg 166.7 mL/hr over 90 Minutes Intravenous Every 12 hours 01/10/13 1445 01/11/13 0356   01/11/13 0000  cefTRIAXone (ROCEPHIN) 2 g in dextrose 5 % 50 mL IVPB  Status:  Discontinued     2 g 100 mL/hr over 30 Minutes Intravenous Every 12 hours 01/10/13 1443 01/12/13 1444   01/10/13 2100  doxycycline (VIBRA-TABS) tablet 100 mg  Status:  Discontinued     100 mg Oral  Once 01/10/13 1646 01/10/13 1648   01/10/13 2100  sulfamethoxazole-trimethoprim (BACTRIM DS) 800-160 MG per tablet 1 tablet  Status:  Discontinued     1 tablet Oral  Once 01/10/13 1646 01/10/13 1648   01/10/13 1600  vancomycin (VANCOCIN) 2,000 mg in sodium chloride 0.9 % 500 mL IVPB     2,000 mg 250 mL/hr over 120 Minutes Intravenous  Once 01/10/13 1444 01/11/13 0429   01/10/13 1500  acyclovir (ZOVIRAX) 600 mg in dextrose 5 % 100 mL IVPB  Status:  Discontinued     600  mg 112 mL/hr over 60 Minutes Intravenous Every 8 hours 01/10/13 1431 01/10/13 1442   01/10/13 1500  acyclovir (ZOVIRAX) 500 mg in dextrose 5 % 100 mL IVPB  Status:  Discontinued     500 mg 110 mL/hr over 60 Minutes Intravenous Every 8 hours 01/10/13 1442 01/12/13 1444   01/10/13 1115  cefTRIAXone (ROCEPHIN) 2 g in dextrose 5 % 50 mL IVPB     2 g 100 mL/hr over 30 Minutes Intravenous  Once 01/10/13 1110 01/10/13 1258   01/10/13 1115  vancomycin (VANCOCIN) IVPB 1000 mg/200 mL premix     1,000 mg 200 mL/hr over 60 Minutes Intravenous  Once 01/10/13 1110 01/10/13 1403      Medications: Scheduled Meds: . budesonide-formoterol  2 puff Inhalation BID  . cefTRIAXone (ROCEPHIN)  IV  2 g Intravenous Q12H  . famotidine  20 mg Oral BID  . ketorolac  30 mg Intravenous Q6H  . sodium chloride  3 mL Intravenous Q12H   Continuous Infusions: . sodium chloride 50 mL/hr at 01/11/13 1800   PRN Meds:.acetaminophen, acetaminophen, morphine injection,  ondansetron (ZOFRAN) IV, ondansetron, oxyCODONE, sodium chloride, traMADol   Objective: Weight change:   Intake/Output Summary (Last 24 hours) at 01/13/13 1637 Last data filed at 01/13/13 1310  Gross per 24 hour  Intake   1230 ml  Output   2625 ml  Net  -1395 ml   Blood pressure 104/52, pulse 68, temperature 98.5 F (36.9 C), temperature source Oral, resp. rate 20, height 5\' 2"  (1.575 m), weight 251 lb 15.8 oz (114.3 kg), SpO2 98.00%. Temp:  [98.1 F (36.7 C)-98.7 F (37.1 C)] 98.5 F (36.9 C) (11/05 1406) Pulse Rate:  [64-70] 68 (11/05 1406) Resp:  [20] 20 (11/05 1406) BP: (100-120)/(41-75) 104/52 mmHg (11/05 1406) SpO2:  [95 %-98 %] 98 % (11/05 1406)  Physical Exam: General: Alert and awake, oriented x3, not in any acute distress. HEENT: anicteric sclera, pupils reactive to light and accommodation, EOMI CVS regular rate, normal r,  no murmur rubs or gallops Chest: clear to auscultation bilaterally, no wheezing, rales or rhonchi Abdomen: soft nontender, nondistended, normal bowel sounds, Extremities: no  clubbing or edema noted bilaterally Skin: no rashes Lymph: no new lymphadenopathy Neuro: nonfocal  Lab Results:  Recent Labs  01/12/13 0411 01/13/13 0420  WBC 11.3* 7.5  HGB 11.5* 11.3*  HCT 34.7* 34.3*  PLT 296 288    BMET  Recent Labs  01/12/13 0411 01/13/13 0420  NA 138 139  K 4.0 3.9  CL 104 104  CO2 22 25  GLUCOSE 85 85  BUN 17 13  CREATININE 0.89 0.88  CALCIUM 8.8 8.7    Micro Results: Recent Results (from the past 240 hour(s))  CULTURE, BLOOD (ROUTINE X 2)     Status: None   Collection Time    01/10/13 11:30 AM      Result Value Range Status   Specimen Description BLOOD RIGHT ARM   Final   Special Requests BOTTLES DRAWN AEROBIC AND ANAEROBIC 8CC   Final   Culture  Setup Time     Final   Value: 01/12/2013 19:00     Performed at [facility]   Culture     Final   Value: HAEMOPHILUS INFLUENZAE     Note: BETA LACTAMASE POSITIVE  CRITICAL RESULT CALLED TO, READ BACK BY AND VERIFIED WITH: DR. VAN DAM 1434 01/13/13 BY KRAWS     Note: Gram Stain Report Called to,Read Back By and Verified With: LOVE  SHANNON [facility]) AT 1136 ON 01/12/2013 BY Luetta Nutting M     Performed at [facility]   Report Status PENDING   Incomplete  CULTURE, BLOOD (ROUTINE X 2)     Status: None   Collection Time    01/10/13 11:40 AM      Result Value Range Status   Specimen Description BLOOD LEFT HAND   Final   Special Requests BOTTLES DRAWN AEROBIC ONLY 8CC   Final   Culture NO GROWTH 3 DAYS   Final   Report Status PENDING   Incomplete  MRSA PCR SCREENING     Status: None   Collection Time    01/10/13  6:21 PM      Result Value Range Status   MRSA by PCR NEGATIVE  NEGATIVE Final   Comment:            The GeneXpert MRSA Assay (FDA     approved for NASAL specimens     only), is one component of a     comprehensive MRSA colonization     surveillance program. It is not     intended to diagnose MRSA     infection nor to guide or     monitor treatment for     MRSA infections.  CSF CULTURE     Status: None   Collection Time    01/11/13  3:30 PM      Result Value Range Status   Specimen Description CSF   Final   Special Requests 3.0ML   Final   Gram Stain     Final   Value: CYTOSPIN SLIDE WBC PRESENT,BOTH PMN AND MONONUCLEAR     NO ORGANISMS SEEN     Performed at [facility]     Performed at [facility]   Culture     Final   Value: NO GROWTH 1 DAY     Performed at [facility]   Report Status PENDING   Incomplete  GRAM STAIN     Status: None   Collection Time    01/11/13  3:30 PM      Result Value Range Status   Specimen Description CSF   Final   Special Requests 3.   Final   Gram Stain     Final   Value: WBC PRESENT,BOTH PMN AND MONONUCLEAR     NO ORGANISMS SEEN     CYTOSPIN SLIDE   Report Status 01/11/2013 FINAL   Final  FUNGUS CULTURE W SMEAR     Status: None   Collection Time    01/11/13   3:30 PM      Result Value Range Status   Specimen Description CSF   Final   Special Requests 3.0ML FLUID   Final   Fungal Smear     Final   Value: NO YEAST OR FUNGAL ELEMENTS SEEN     Performed at [facility]   Culture     Final   Value: CULTURE IN PROGRESS FOR FOUR WEEKS     Performed at [facility]   Report Status PENDING   Incomplete  AFB CULTURE WITH SMEAR     Status: None   Collection Time    01/11/13  3:30 PM      Result Value Range Status   Specimen Description CSF   Final   Special Requests NO 2   Final   ACID FAST SMEAR     Final   Value: NO ACID FAST BACILLI SEEN  Performed at [facility]     Final   Value: CULTURE WILL BE EXAMINED FOR 6 WEEKS BEFORE ISSUING A FINAL REPORT     Performed at [facility]   Report Status PENDING   Incomplete  CULTURE, BLOOD (ROUTINE X 2)     Status: None   Collection Time    01/12/13  4:35 PM      Result Value Range Status   Specimen Description BLOOD RIGHT ARM   Final   Special Requests BOTTLES DRAWN AEROBIC ONLY 8CC   Final   Culture  Setup Time     Final   Value: 01/12/2013 22:03     Performed at [facility]   Culture     Final   Value:        BLOOD CULTURE RECEIVED NO GROWTH TO DATE CULTURE WILL BE HELD FOR 5 DAYS BEFORE ISSUING A FINAL NEGATIVE REPORT     Performed at [facility]   Report Status PENDING   Incomplete  CULTURE, BLOOD (ROUTINE X 2)     Status: None   Collection Time    01/12/13  4:40 PM      Result Value Range Status   Specimen Description BLOOD RIGHT HAND   Final   Special Requests BOTTLES DRAWN AEROBIC ONLY 8CC   Final   Culture  Setup Time     Final   Value: 01/12/2013 22:03     Performed at [facility]   Culture     Final   Value:        BLOOD CULTURE RECEIVED NO GROWTH TO DATE CULTURE WILL BE HELD FOR 5 DAYS BEFORE ISSUING A FINAL NEGATIVE REPORT     Performed at [facility]   Report Status PENDING   Incomplete     Studies/Results: No results found.    Assessment/Plan: Caroline Velez is a 57 y.o. female with  H. 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 I am not sure how such information was conveyed to primary team and Dr. Butler [place]. I had spoken with Micro lab last night who were unhelpful but today Micro confirmed they had been working up what appeared to be H. Flu in blood and this has been confirmed as H Flu that is BETA LACTAMASE positive  #1 H Flu bacteremia and meningitis:  --Switched back to IV ceftriaxone 2g IV q 12 hours and will have pt complete total of 14 days including today (not counting yesterday as AMP would not have been active vs beta lactamase producing H flu  #2 Screening: HIV, hep panel negative   LOS: 3 days   Caroline Velez 01/13/2013, 4:37 PM

## 2013-01-13 NOTE — Care Management Note (Unsigned)
    Page 1 of 1   01/13/2013     10:21:33 AM   CARE MANAGEMENT NOTE 01/13/2013  Patient:  NAKESHA, EBRAHIM   Account Number:  [id number]  Date Initiated:  01/12/2013  Documentation initiated by:  Donn Pierini  Subjective/Objective Assessment:   Pt admitted with headache- Suspected bacterial meningitis     Action/Plan:   PTA pt lived at home with family, NCM to follow for d/c needs   Anticipated DC Date:  01/15/2013   Anticipated DC Plan:  HOME W HOME HEALTH SERVICES      DC Planning Services  CM consult      Choice offered to / List presented to:  C-1 Patient        HH arranged  IV Antibiotics      HH agency  [facility]   Status of service:  Completed, signed off Medicare Important Message given?   (If response is "NO", the following Medicare IM given date fields will be blank) Date Medicare IM given:   Date Additional Medicare IM given:    Discharge Disposition:    Per UR Regulation:  Reviewed for med. necessity/level of care/duration of stay  If discussed at Long Length of Stay Meetings, dates discussed:    Comments:  01/13/13 1015 Elmer Bales RN, MSN, CM- Met with patient to discuss home health for IV antibiotic therapy.  Patient has chosen to use [facility], which her family has used in the past.  Hilda Lias with [facility] was notified of referral and probable discharge later today.

## 2013-01-13 NOTE — Progress Notes (Signed)
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 bacteremia + rule out meningitis  No Known Allergies  Patient Measurements: Height: 5\' 2"  (157.5 cm) Weight: 251 lb 15.8 oz (114.3 kg) IBW/kg (Calculated) : 50.1 Adjusted body weight = 76 kg  Vital Signs: Temp: 98.7 F (37.1 C) (11/05 1020) Temp src: Oral (11/05 1020) BP: 113/41 mmHg (11/05 1020) Pulse Rate: 68 (11/05 1020) Intake/Output from previous day: 11/04 0701 - 11/05 0700 In: 1340 [P.O.:240; I.V.:800; IV Piggyback:300] Out: 300 [Urine:300] Intake/Output from this shift: Total I/O In: 240 [P.O.:240] Out: 1500 [Urine:1500]  Labs:  Recent Labs  01/12/13 0411 01/13/13 0420  WBC 11.3* 7.5  HGB 11.5* 11.3*  PLT 296 288  CREATININE 0.89 0.88   Estimated Creatinine Clearance: 85.4 ml/min (by C-G formula based on Cr of 0.88).  Recent Labs  01/13/13 0420  GENTRANDOM 4.7     Microbiology: Recent Results (from the past 720 hour(s))  CULTURE, BLOOD (ROUTINE X 2)     Status: None   Collection Time    01/10/13 11:30 AM      Result Value Range Status   Specimen Description BLOOD RIGHT ARM   Final   Special Requests BOTTLES DRAWN AEROBIC AND ANAEROBIC 8CC   Final   Culture  Setup Time     Final   Value: 01/12/2013 19:00     Performed at [facility]   Culture     Final   Value: GRAM NEGATIVE COCCOBACILLI     Note: Gram Stain Report Called to,Read Back By and Verified With: Roselie Awkward [facility]) AT 1136 ON 01/12/2013 BY Luetta Nutting M     Performed at [facility]   Report Status PENDING   Incomplete  CULTURE, BLOOD (ROUTINE X 2)     Status: None   Collection Time    01/10/13 11:40 AM      Result Value Range Status   Specimen Description BLOOD LEFT HAND   Final   Special Requests BOTTLES DRAWN AEROBIC ONLY 8CC   Final   Culture NO GROWTH 3 DAYS   Final   Report Status PENDING   Incomplete  MRSA PCR SCREENING     Status: None   Collection Time     01/10/13  6:21 PM      Result Value Range Status   MRSA by PCR NEGATIVE  NEGATIVE Final   Comment:            The GeneXpert MRSA Assay (FDA     approved for NASAL specimens     only), is one component of a     comprehensive MRSA colonization     surveillance program. It is not     intended to diagnose MRSA     infection nor to guide or     monitor treatment for     MRSA infections.  CSF CULTURE     Status: None   Collection Time    01/11/13  3:30 PM      Result Value Range Status   Specimen Description CSF   Final   Special Requests 3.0ML   Final   Gram Stain     Final   Value: CYTOSPIN SLIDE WBC PRESENT,BOTH PMN AND MONONUCLEAR     NO ORGANISMS SEEN     Performed at [facility]     Performed at [facility]   Culture     Final   Value: NO GROWTH  Performed at [facility]   Report Status PENDING   Incomplete  GRAM STAIN     Status: None   Collection Time    01/11/13  3:30 PM      Result Value Range Status   Specimen Description CSF   Final   Special Requests 3.   Final   Gram Stain     Final   Value: WBC PRESENT,BOTH PMN AND MONONUCLEAR     NO ORGANISMS SEEN     CYTOSPIN SLIDE   Report Status 01/11/2013 FINAL   Final  FUNGUS CULTURE W SMEAR     Status: None   Collection Time    01/11/13  3:30 PM      Result Value Range Status   Specimen Description CSF   Final   Special Requests 3.0ML FLUID   Final   Fungal Smear     Final   Value: NO YEAST OR FUNGAL ELEMENTS SEEN     Performed at [facility]   Culture     Final   Value: CULTURE IN PROGRESS FOR FOUR WEEKS     Performed at [facility]   Report Status PENDING   Incomplete  AFB CULTURE WITH SMEAR     Status: None   Collection Time    01/11/13  3:30 PM      Result Value Range Status   Specimen Description CSF   Final   Special Requests NO 2   Final   ACID FAST SMEAR     Final   Value: NO ACID FAST BACILLI SEEN     Performed at [facility]    Culture     Final   Value: CULTURE WILL BE EXAMINED FOR 6 WEEKS BEFORE ISSUING A FINAL REPORT     Performed at [facility]   Report Status PENDING   Incomplete  CULTURE, BLOOD (ROUTINE X 2)     Status: None   Collection Time    01/12/13  4:35 PM      Result Value Range Status   Specimen Description BLOOD RIGHT ARM   Final   Special Requests BOTTLES DRAWN AEROBIC ONLY 8CC   Final   Culture  Setup Time     Final   Value: 01/12/2013 22:03     Performed at [facility]   Culture     Final   Value:        BLOOD CULTURE RECEIVED NO GROWTH TO DATE CULTURE WILL BE HELD FOR 5 DAYS BEFORE ISSUING A FINAL NEGATIVE REPORT     Performed at [facility]   Report Status PENDING   Incomplete  CULTURE, BLOOD (ROUTINE X 2)     Status: None   Collection Time    01/12/13  4:40 PM      Result Value Range Status   Specimen Description BLOOD RIGHT HAND   Final   Special Requests BOTTLES DRAWN AEROBIC ONLY 8CC   Final   Culture  Setup Time     Final   Value: 01/12/2013 22:03     Performed at [facility]   Culture     Final   Value:        BLOOD CULTURE RECEIVED NO GROWTH TO DATE CULTURE WILL BE HELD FOR 5 DAYS BEFORE ISSUING A FINAL NEGATIVE REPORT     Performed at [facility]   Report Status PENDING   Incomplete    Medical History: Past Medical History  Diagnosis  Date  . Allergy        Assessment: 7 YOF transferred from [facility] for further management of possible meningitis.  Patient was started on Rocephin, vancomycin, ampicillin, and acyclovir.  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 Gentamycin 7mg /kg (base on IBW) was given last night @ 2010, Gent leve = 4.7, drawn ~ 8 hrs after dose. This level is ok for Q 24 hr dosing. Likely d/c home today, PICC line placed  Rocephin 11/2 >> 11/4 Vanc 11/2 >> 11/4 Acyclovir 11/2 >> 11/4 Ampicillin 11/3 >> Natasha Bence 11/4 >>  11/2  BC x 2 - Gram negative coccobacilli (1 of 2) 11/3 CSF cx - neg LP - WBC seen 11/4 blood - ngtd  Goal of Therapy:  Appropriate dosing  Plan:  - Continue Gentamicin 530 mg IV Q 24 hrs - Recommend monitor BMETweekly while on Gent if going home. - Continue ampicillin 2gm IV Q4H as ordered - Monitor renal fxn, micro data, clinical course  Bayard Hugger, PharmD, BCPS  Clinical Pharmacist  Pager: [phone]  01/13/2013, 12:06 PM

## 2013-01-13 NOTE — Progress Notes (Signed)
[facility]                                                                                Patient Demographics  Caroline Velez, is a 57 y.o. female, DOB - May 02, 1955, RUE:[id number]  Admit date - 01/10/2013   Admitting Physician Lonia Blood, MD  Outpatient Primary MD for the patient is Avon Gully, MD  LOS - 3   Chief Complaint  Patient presents with  . Headache  . Altered Mental Status        Assessment & Plan    Principal Problem:   Haemophilus meningitis Active Problems:   Headache   Mental status change   Bacterial meningitis   Fever   Sepsis  Suspected bacterial meningitis  -PICC line ordered -Will continue IV Ampicillin and Gentamicin (with pharmacy to dose and follow) -ID following. -Pending repeat blood cultures, negative at this time -Will continue tramadol PRN for head and neck pain  -Hepatitis and HIV negative  Mild hyperglycemia  Likely simply stress reaction  HbA 1c 6.3  Code Status: FULL  Family Communication: none at this time Disposition Plan: Will likely plan on discharge tomorrow  Consultants:  IR   Procedures:  LP - 11/3  DVT Prophylaxis  SCDs  Lab Results  Component Value Date   PLT 288 01/13/2013    Medications  Scheduled Meds: . budesonide-formoterol  2 puff Inhalation BID  . cefTRIAXone (ROCEPHIN)  IV  2 g Intravenous Q12H  . famotidine  20 mg Oral BID  . ketorolac  30 mg Intravenous Q6H  . sodium chloride  3 mL Intravenous Q12H   Continuous Infusions: . sodium chloride 50 mL/hr at 01/11/13 1800   PRN Meds:.acetaminophen, acetaminophen, morphine injection, ondansetron (ZOFRAN) IV, ondansetron, oxyCODONE, sodium chloride, traMADol  Antibiotics    Anti-infectives   Start     Dose/Rate Route Frequency Ordered Stop   01/13/13 2000  gentamicin (GARAMYCIN) 530 mg in dextrose 5 % 100 mL IVPB  Status:  Discontinued     530 mg 113.3 mL/hr over 60 Minutes Intravenous Every 24 hours 01/13/13 1216 01/13/13 1354    01/13/13 1500  cefTRIAXone (ROCEPHIN) 2 g in dextrose 5 % 50 mL IVPB     2 g 100 mL/hr over 30 Minutes Intravenous Every 12 hours 01/13/13 1350     01/13/13 1330  gentamicin (GARAMYCIN) 800 mg in dextrose 5 % 50 mL IVPB  Status:  Discontinued     7 mg/kg  114.3 kg 140 mL/hr over 30 Minutes Intravenous  Once 01/13/13 1322 01/13/13 1329   01/13/13 1215  gentamicin (GARAMYCIN) 530 mg in dextrose 5 % 100 mL IVPB  Status:  Discontinued     7 mg/kg  75.8 kg (Adjusted) 113.3 mL/hr over 60 Minutes Intravenous  Once 01/13/13 1204 01/13/13 1218   01/13/13 0000  sodium chloride 0.9 % SOLN 50 mL with ampicillin 2 G SOLR 2 g     2 g 150 mL/hr over 20 Minutes Intravenous Every 4 hours 01/13/13 1028     01/13/13 0000  dextrose 5 % SOLN 100 mL with gentamicin 40 MG/ML SOLN 530 mg    Comments:  530mg  injection to be given daily  through 02/02/2013.   7 mg/kg  75.8 kg (Adjusted) 113.3 mL/hr over 60 Minutes Intravenous  Once 01/13/13 1214 02/02/13 2359   01/12/13 1800  gentamicin (GARAMYCIN) 530 mg in dextrose 5 % 100 mL IVPB     7 mg/kg  75.8 kg (Adjusted) 113.3 mL/hr over 60 Minutes Intravenous  Once 01/12/13 1704 01/12/13 2110   01/11/13 1600  ampicillin (OMNIPEN) 2 g in sodium chloride 0.9 % 50 mL IVPB  Status:  Discontinued     2 g 150 mL/hr over 20 Minutes Intravenous 6 times per day 01/11/13 1514 01/13/13 1354   01/11/13 1400  vancomycin (VANCOCIN) 1,250 mg in sodium chloride 0.9 % 250 mL IVPB  Status:  Discontinued     1,250 mg 166.7 mL/hr over 90 Minutes Intravenous Every 12 hours 01/11/13 0356 01/12/13 1444   01/11/13 0400  vancomycin (VANCOCIN) 1,250 mg in sodium chloride 0.9 % 250 mL IVPB  Status:  Discontinued     1,250 mg 166.7 mL/hr over 90 Minutes Intravenous Every 12 hours 01/10/13 1445 01/11/13 0356   01/11/13 0000  cefTRIAXone (ROCEPHIN) 2 g in dextrose 5 % 50 mL IVPB  Status:  Discontinued     2 g 100 mL/hr over 30 Minutes Intravenous Every 12 hours 01/10/13 1443 01/12/13 1444    01/10/13 2100  doxycycline (VIBRA-TABS) tablet 100 mg  Status:  Discontinued     100 mg Oral  Once 01/10/13 1646 01/10/13 1648   01/10/13 2100  sulfamethoxazole-trimethoprim (BACTRIM DS) 800-160 MG per tablet 1 tablet  Status:  Discontinued     1 tablet Oral  Once 01/10/13 1646 01/10/13 1648   01/10/13 1600  vancomycin (VANCOCIN) 2,000 mg in sodium chloride 0.9 % 500 mL IVPB     2,000 mg 250 mL/hr over 120 Minutes Intravenous  Once 01/10/13 1444 01/11/13 0429   01/10/13 1500  acyclovir (ZOVIRAX) 600 mg in dextrose 5 % 100 mL IVPB  Status:  Discontinued     600 mg 112 mL/hr over 60 Minutes Intravenous Every 8 hours 01/10/13 1431 01/10/13 1442   01/10/13 1500  acyclovir (ZOVIRAX) 500 mg in dextrose 5 % 100 mL IVPB  Status:  Discontinued     500 mg 110 mL/hr over 60 Minutes Intravenous Every 8 hours 01/10/13 1442 01/12/13 1444   01/10/13 1115  cefTRIAXone (ROCEPHIN) 2 g in dextrose 5 % 50 mL IVPB     2 g 100 mL/hr over 30 Minutes Intravenous  Once 01/10/13 1110 01/10/13 1258   01/10/13 1115  vancomycin (VANCOCIN) IVPB 1000 mg/200 mL premix     1,000 mg 200 mL/hr over 60 Minutes Intravenous  Once 01/10/13 1110 01/10/13 1403       Time Spent in minutes   30 minutes   Latayna Ritchie D.O. on 01/13/2013 at 2:39 PM  Between 7am to 7pm - Pager - [phone]  After 7pm go to [email or web address] - password [email or web address]  And look for the night coverage person covering for me after hours  [facility] Office  [phone]    Subjective:   Caroline Velez seen and examined today.  Currently states she is feeling better however continues to complain of headache, starting in the back of her neck.  Patient denies dizziness, chest pain, shortness of breath, abdominal pain, N/V/D/C, new weakness, numbess, tingling.    Objective:   Filed Vitals:   01/13/13 0341 01/13/13 0704 01/13/13 1020 01/13/13 1406  BP: 120/75 108/69 113/41 104/52  Pulse: 70 69 68 68  Temp: 98.6 F (  37 C) 98.1 F (36.7  C) 98.7 F (37.1 C) 98.5 F (36.9 C)  TempSrc: Oral Oral Oral Oral  Resp: 20 20 20 20   Height:      Weight:      SpO2: 97% 96% 98% 98%    Wt Readings from Last 3 Encounters:  01/10/13 114.3 kg (251 lb 15.8 oz)  07/26/10 105.144 kg (231 lb 12.8 oz)     Intake/Output Summary (Last 24 hours) at 01/13/13 1439 Last data filed at 01/13/13 1310  Gross per 24 hour  Intake   1230 ml  Output   2625 ml  Net  -1395 ml    Exam  General: Well developed, well nourished, NAD, appears stated age  HEENT: NCAT, PERRLA, EOMI, Anicteic Sclera, mucous membranes moist. No pharyngeal erythema or exudates  Neck: Supple, no JVD, no masses  Cardiovascular: S1 S2 auscultated, no rubs, murmurs or gallops. Regular rate and rhythm.  Respiratory: Clear to auscultation bilaterally with equal chest rise  Abdomen: Soft, nontender, nondistended, + bowel sounds  Extremities: warm dry without cyanosis clubbing or edema  Neuro: AAOx3, cranial nerves grossly intact. Strength 5/5 in patient's upper and lower extremities bilaterally  Skin: Without rashes exudates or nodules  Psych: Normal affect and demeanor with intact judgement and insight  Data Review   Micro Results Recent Results (from the past 240 hour(s))  CULTURE, BLOOD (ROUTINE X 2)     Status: None   Collection Time    01/10/13 11:30 AM      Result Value Range Status   Specimen Description BLOOD RIGHT ARM   Final   Special Requests BOTTLES DRAWN AEROBIC AND ANAEROBIC 8CC   Final   Culture  Setup Time     Final   Value: 01/12/2013 19:00     Performed at [facility]   Culture     Final   Value: HAEMOPHILUS INFLUENZAE     Note: BETA LACTAMASE POSITIVE CRITICAL RESULT CALLED TO, READ BACK BY AND VERIFIED WITH: DR. VAN DAM 1434 01/13/13 BY KRAWS     Note: Gram Stain Report Called to,Read Back By and Verified With: LOVE SHANNON [facility]) AT 1136 ON 01/12/2013 BY Luetta Nutting M     Performed at [facility]   Report Status PENDING   Incomplete   CULTURE, BLOOD (ROUTINE X 2)     Status: None   Collection Time    01/10/13 11:40 AM      Result Value Range Status   Specimen Description BLOOD LEFT HAND   Final   Special Requests BOTTLES DRAWN AEROBIC ONLY 8CC   Final   Culture NO GROWTH 3 DAYS   Final   Report Status PENDING   Incomplete  MRSA PCR SCREENING     Status: None   Collection Time    01/10/13  6:21 PM      Result Value Range Status   MRSA by PCR NEGATIVE  NEGATIVE Final   Comment:            The GeneXpert MRSA Assay (FDA     approved for NASAL specimens     only), is one component of a     comprehensive MRSA colonization     surveillance program. It is not     intended to diagnose MRSA     infection nor to guide or     monitor treatment for     MRSA infections.  CSF CULTURE     Status: None   Collection  Time    01/11/13  3:30 PM      Result Value Range Status   Specimen Description CSF   Final   Special Requests 3.0ML   Final   Gram Stain     Final   Value: CYTOSPIN SLIDE WBC PRESENT,BOTH PMN AND MONONUCLEAR     NO ORGANISMS SEEN     Performed at [facility]     Performed at [facility]   Culture     Final   Value: NO GROWTH 1 DAY     Performed at [facility]   Report Status PENDING   Incomplete  GRAM STAIN     Status: None   Collection Time    01/11/13  3:30 PM      Result Value Range Status   Specimen Description CSF   Final   Special Requests 3.   Final   Gram Stain     Final   Value: WBC PRESENT,BOTH PMN AND MONONUCLEAR     NO ORGANISMS SEEN     CYTOSPIN SLIDE   Report Status 01/11/2013 FINAL   Final  FUNGUS CULTURE W SMEAR     Status: None   Collection Time    01/11/13  3:30 PM      Result Value Range Status   Specimen Description CSF   Final   Special Requests 3.0ML FLUID   Final   Fungal Smear     Final   Value: NO YEAST OR FUNGAL ELEMENTS SEEN     Performed at [facility]   Culture     Final   Value: CULTURE IN PROGRESS FOR FOUR WEEKS      Performed at [facility]   Report Status PENDING   Incomplete  AFB CULTURE WITH SMEAR     Status: None   Collection Time    01/11/13  3:30 PM      Result Value Range Status   Specimen Description CSF   Final   Special Requests NO 2   Final   ACID FAST SMEAR     Final   Value: NO ACID FAST BACILLI SEEN     Performed at [facility]   Culture     Final   Value: CULTURE WILL BE EXAMINED FOR 6 WEEKS BEFORE ISSUING A FINAL REPORT     Performed at [facility]   Report Status PENDING   Incomplete  CULTURE, BLOOD (ROUTINE X 2)     Status: None   Collection Time    01/12/13  4:35 PM      Result Value Range Status   Specimen Description BLOOD RIGHT ARM   Final   Special Requests BOTTLES DRAWN AEROBIC ONLY 8CC   Final   Culture  Setup Time     Final   Value: 01/12/2013 22:03     Performed at [facility]   Culture     Final   Value:        BLOOD CULTURE RECEIVED NO GROWTH TO DATE CULTURE WILL BE HELD FOR 5 DAYS BEFORE ISSUING A FINAL NEGATIVE REPORT     Performed at [facility]   Report Status PENDING   Incomplete  CULTURE, BLOOD (ROUTINE X 2)     Status: None   Collection Time    01/12/13  4:40 PM      Result Value Range Status   Specimen Description BLOOD RIGHT HAND   Final   Special Requests BOTTLES DRAWN  AEROBIC ONLY 8CC   Final   Culture  Setup Time     Final   Value: 01/12/2013 22:03     Performed at [facility]   Culture     Final   Value:        BLOOD CULTURE RECEIVED NO GROWTH TO DATE CULTURE WILL BE HELD FOR 5 DAYS BEFORE ISSUING A FINAL NEGATIVE REPORT     Performed at [facility]   Report Status PENDING   Incomplete    Radiology Reports Ct Head Wo Contrast  01/10/2013   CLINICAL DATA:  Initial encounter for acute mental status changes. Patient currently unresponsive.  EXAM: CT HEAD WITHOUT CONTRAST  TECHNIQUE: Contiguous axial images were obtained from the base of the skull through the vertex without  intravenous contrast.  COMPARISON:  None.  FINDINGS: Head tilt in the gantry accounts for apparent asymmetry in the cerebral hemispheres. Ventricular system normal in size and appearance for age. No mass lesion. No midline shift. No acute hemorrhage or hematoma. No extra-axial fluid collections. No evidence of acute infarction. Cerebellar tonsils extend below the level of the medulla.  No focal osseous abnormality involving the skull. Mucosal thickening involving the sphenoid sinuses and minimal mucosal thickening involving the visualized maxillary sinuses. Opacification of left mastoid air cells and the left middle ear cavity. Right mastoid air cells and right middle ear cavity well aerated.  IMPRESSION: 1. No acute intracranial abnormality. 2. 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 3. Left mastoid effusion and left middle ear cavity effusion. Minimal chronic sinus disease involving the maxillary and sphenoid sinuses. Non emergent MRI of the brain without and with contrast is recommended to confirm or deny the presence of a Chiari malformation.   Electronically Signed   By: Hulan Saas M.D.   On: 01/10/2013 12:36   Mr Laqueta Jean ZO Contrast  01/11/2013   CLINICAL DATA:  Fever and headache. Altered mental status.  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  Ventricle size is normal. Negative for acute or chronic infarct. Cerebral white matter is normal.  Abnormal signal in the subarachnoid space on FLAIR imaging suggestive of meningitis. There is also prominent enhancement of the cerebral vessels following  contrast administration suggesting meningitis. No subdural effusion. Negative for brain abscess. No focal brain edema negative for intracranial hemorrhage.  Mild mucosal edema in the paranasal sinuses. Left mastoid sinus effusion.  IMPRESSION: Findings are suggestive of meningitis. Lumbar puncture and CSF sampling are suggested.  Negative for abscess or fluid collection. No acute infarct.  Chiari malformation.   Electronically Signed   By: Marlan Palau M.D.   On: 01/11/2013 12:36   Dg Chest Port 1 View  01/10/2013   CLINICAL DATA:  Fever, sepsis.  EXAM: PORTABLE CHEST - 1 VIEW  COMPARISON:  05/02/2009  FINDINGS: The film is made with shallow lung inflation. Heart size is exaggerated by shallow inspiration and portable technique but is probably enlarged. There are mild perihilar changes of pulmonary edema. Degenerative changes are seen in the spine.  IMPRESSION: 1. Shallow inflation. 2. Cardiomegaly and probable mild edema.   Electronically Signed   By: Sandria Senter.D.  On: 01/10/2013 15:15   Dg Fluoro Guide Lumbar Puncture  01/11/2013   CLINICAL DATA:  Meningitis.  EXAM: DIAGNOSTIC LUMBAR PUNCTURE UNDER FLUOROSCOPIC GUIDANCE  FLUOROSCOPY TIME:  1 min and 1 2nd.  PROCEDURE: Informed consent was obtained from the patient prior to the procedure, including potential complications of headache, allergy, and pain. I also discussed the patient's Chiari malformation and the slight higher risk of headache. With the patient prone, the lower back was prepped with Betadine. 1% Lidocaine was used for local anesthesia. Lumbar puncture was performed at the L4-5 level using a gauge needle with return of somewhat cloudy CSF with an opening pressure of cm water. Elevenml of CSF were obtained for laboratory studies. The patient tolerated the procedure well and there were no apparent complications.  IMPRESSION: Fluoroscopic guided lumbar puncture with 11 cc of slightly cloudy CSF obtained for appropriate laboratory evaluation.  Patient tolerated procedure well without immediate complications.   Electronically Signed   By: Loralie Champagne M.D.   On: 01/11/2013 17:00    CBC  Recent Labs Lab 01/10/13 1133 01/12/13 0411 01/13/13 0420  WBC 13.8* 11.3* 7.5  HGB 12.8 11.5* 11.3*  HCT 38.7 34.7* 34.3*  PLT 317 296 288  MCV 88.8 88.3 89.1  MCH 29.4 29.3 29.4  MCHC 33.1 33.1 32.9  RDW 14.1 14.5 14.6  LYMPHSABS 1.2  --   --   MONOABS 1.2*  --   --   EOSABS 0.0  --   --   BASOSABS 0.0  --   --     Chemistries   Recent Labs Lab 01/10/13 1133 01/12/13 0411 01/13/13 0420  NA 134* 138 139  K 3.7 4.0 3.9  CL 99 104 104  CO2 25 22 25   GLUCOSE 151* 85 85  BUN 10 17 13   CREATININE 0.91 0.89 0.88  CALCIUM 9.6 8.8 8.7  AST 20 13 11   ALT 14 9 9   ALKPHOS 140* 93 90  BILITOT 0.3 0.2* 0.3   ------------------------------------------------------------------------------------------------------------------ estimated creatinine clearance is 85.4 ml/min (by C-G formula based on Cr of 0.88). ------------------------------------------------------------------------------------------------------------------  Recent Labs  01/12/13 0411  HGBA1C 6.3*   ------------------------------------------------------------------------------------------------------------------ No results found for this basename: CHOL, HDL, LDLCALC, TRIG, CHOLHDL, LDLDIRECT,  in the last 72 hours ------------------------------------------------------------------------------------------------------------------ No results found for this basename: TSH, T4TOTAL, FREET3, T3FREE, THYROIDAB,  in the last 72 hours ------------------------------------------------------------------------------------------------------------------ No results found for this basename: VITAMINB12, FOLATE, FERRITIN, TIBC, IRON, RETICCTPCT,  in the last 72 hours  Coagulation profile No results found for this basename: INR, PROTIME,  in the last 168 hours  No results found for  this basename: DDIMER,  in the last 72 hours  Cardiac Enzymes No results found for this basename: CK, CKMB, TROPONINI, MYOGLOBIN,  in the last 168 hours ------------------------------------------------------------------------------------------------------------------ No components found with this basename: POCBNP,

## 2013-01-13 NOTE — Discharge Summary (Addendum)
Physician Discharge Summary  Caroline Velez ZOX:[id number] DOB: 1955-11-09 DOA: 01/10/2013  PCP: Avon Gully, MD  Admit date: 01/10/2013 Discharge date: 01/13/2013  Time spent: 35 minutes  Recommendations for Outpatient Follow-up:  Patient will need to follow up with Dr. Synthia Innocent (Infectious disease) within 2 weeks of discharge.  She should also follow up with PCP within 1-2 weeks of discharge.  Patient will need to have a BMP weekly to monitor her renal function.  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 female With no significant PMH who presents to the ED with a new onset headache that started last night. Pt was with family members in her usual state of health eating washed grapes when she later noted worsening headaches shortly thereafter. The patient went to bed, but was found to be more lethargic and altered on the morning of admission.The patient was then brought to the ED where she remained lethargic. The patient was found to be febrile with a temp of over 101 and a wbc of over 13K. A UA was unremarkable. A head CT demonstrated a L mastoid effusion but was neg for bleed. A bedside LP was attempted, but was unsuccessful. The hospitalist was subsequently consulted.  Hospital Course:  57 year old female that presents emergency department with fever of 101 and white blood count of 13. Patient had complained of headaches and become somewhat lethargic. CT of the head demonstrated a left mastoid effusion but was negative for bleed. Lumbar puncture was attempted but was unsuccessful. Interventional radiology was  consulted for lumbar puncture. Fluid collected and sent off. No organisms have resulted yet. Patient was seen by infectious disease, Dr. Zenaida Niece dam, and was subsequently started on ampicillin. Patient supposedly had one blood culture that was growing Listeria monocytogenes, however, this was not documented in the system.   Patient was started on gentamicin and ampicillin.  However, Dr. Daiva Eves contacted the microbiology lab, and was informed that patient had H. influenza, beta lactamase positive bacteria.  Patient was then switched to ceftriaxone 2 g IV every 12 hours. She will need to continue this regimen for 14 days total. Patient does have PICC line in place.  Patient will need to follow up with her primary care physician within one to 2 weeks of discharge as well as with Dr. Zenaida Niece dam within 2 weeks of discharge. Of note patient was nonreactive for hepatitis A, B, and C as well as HIV test.  Patient also complained of headache during her hospital course. Patient does have a history of migraines. However the headache and she was complaining of is located in the back of her neck. So likely tension headache. Patient be given a short course of tramadol take home.  Procedures: Lumbar puncture 01/11/2013  Consultations: Infectious Disease IR  Discharge Exam: Filed Vitals:   01/13/13 1020  BP: 113/41  Pulse: 68  Temp: 98.7 F (37.1 C)  Resp: 20   Exam  General: Well developed, well nourished, NAD, appears stated age  HEENT: NCAT, PERRLA, EOMI, Anicteic Sclera, mucous membranes moist. No pharyngeal erythema or exudates  Neck: Supple, no  JVD, no masses  Cardiovascular: S1 S2 auscultated, no rubs, murmurs or gallops. Regular rate and rhythm.  Respiratory: Clear to auscultation bilaterally with equal chest rise  Abdomen: Soft, nontender, nondistended, + bowel sounds  Extremities: warm dry without cyanosis clubbing or edema  Neuro: AAOx3, cranial nerves grossly intact. Strength 5/5 in  patient's upper and lower extremities bilaterally  Skin: Without rashes exudates or nodules  Psych: Normal affect and demeanor with intact judgement and insight  Discharge Instructions  Discharge Orders   Future Orders Complete By Expires   Diet - low sodium heart healthy  As directed    Discharge instructions  As directed    Comments:     Patient will need to follow up with Dr. Synthia Innocent (Infectious disease) within 2 weeks of discharge.  She should also follow up with PCP within 1-2 weeks of discharge.  Patient will need to have a BMP weekly to monitor her renal function.   Increase activity slowly  As directed        Medication List         ALEVE 220 MG tablet  Generic drug:  naproxen sodium  Take 220 mg by mouth 2 (two) times daily with a meal.     aspirin-acetaminophen-caffeine 250-250-65 MG per tablet  Commonly known as:  EXCEDRIN MIGRAINE  Take 1 tablet by mouth every 6 (six) hours as needed for pain.     budesonide-formoterol 160-4.5 MCG/ACT inhaler  Commonly known as:  SYMBICORT  Inhale 2 puffs into the lungs 2 (two) times daily.     dextrose 5 % SOLN 100 mL with gentamicin 40 MG/ML SOLN 530 mg  Inject 530 mg into the vein once.     sodium chloride 0.9 % SOLN 50 mL with ampicillin 2 G SOLR 2 g  Inject 2 g into the vein every 4 (four) hours.     traMADol 50 MG tablet  Commonly known as:  ULTRAM  Take 1 tablet (50 mg total) by mouth every 6 (six) hours as needed.       No Known Allergies     Follow-up Information   Follow up with FANTA,TESFAYE, MD. Schedule an appointment as soon as possible for a visit in 1 week.   Specialty:  Internal Medicine   Contact information:   [address] [place] [address] [phone]       Follow up with Acey Lav, MD.   Specialty:  Infectious Diseases   Contact information:   [address] [place] [address] [phone]       Follow up with Acey Lav, MD In  2 weeks.   Specialty:  Infectious Diseases   Contact information:   [address] [place] [address] [phone]        The results of significant diagnostics from this hospitalization (including imaging, microbiology, ancillary and laboratory) are listed below for reference.    Significant Diagnostic Studies: Ct Head Wo Contrast  01/10/2013   CLINICAL DATA:  Initial encounter for acute mental status changes. Patient currently unresponsive.  EXAM: CT HEAD WITHOUT CONTRAST  TECHNIQUE: Contiguous axial images were obtained from the base of the skull through the vertex without intravenous contrast.  COMPARISON:  None.  FINDINGS: Head tilt in the gantry accounts for apparent asymmetry in the cerebral hemispheres. Ventricular system normal in size and appearance for age. No mass lesion. No midline shift. No acute hemorrhage or hematoma. No  extra-axial fluid collections. No evidence of acute infarction. Cerebellar tonsils extend below the level of the medulla.  No focal osseous abnormality involving the skull. Mucosal thickening involving the sphenoid sinuses and minimal mucosal thickening involving the visualized maxillary sinuses. Opacification of left mastoid air cells and the left middle ear cavity. Right mastoid air cells and right middle ear cavity well aerated.  IMPRESSION: 1. No acute intracranial abnormality. 2. 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 3. Left mastoid effusion and left middle ear cavity effusion. Minimal chronic sinus disease involving the maxillary and sphenoid sinuses. Non emergent MRI of the brain without and with contrast is recommended to confirm or deny the presence of a Chiari malformation.   Electronically Signed   By: Hulan Saas M.D.   On: 01/10/2013 12:36   Mr Laqueta Jean RU Contrast  01/11/2013   CLINICAL DATA:  Fever and headache. Altered mental status.  EXAM: MRI HEAD WITHOUT AND WITH CONTRAST   TECHNIQUE: Multiplanar, multiecho pulse sequences of the brain and surrounding structures were obtained according to standard protocol without and with intravenous contrast  CONTRAST:  20mL MULTIHANCE GADOBENATE DIMEGLUMINE 529 MG/ML IV SOLN  COMPARISON:  CT 01/10/2013  FINDINGS: Chiari malformation. Cerebellar tonsils are impacted extending 17 mm below the foramen magnum.  Ventricle size is normal. Negative for acute or chronic infarct. Cerebral white matter is normal.  Abnormal signal in the subarachnoid space on FLAIR imaging suggestive of meningitis. There is also prominent enhancement of the cerebral vessels following contrast administration suggesting meningitis. No subdural effusion. Negative for brain abscess. No focal brain edema negative for intracranial hemorrhage.  Mild mucosal edema in the paranasal sinuses. Left mastoid sinus effusion.  IMPRESSION: Findings are suggestive of meningitis. Lumbar puncture and CSF sampling are suggested.  Negative for abscess or fluid collection. No acute infarct.  Chiari malformation.   Electronically Signed   By: Marlan Palau M.D.   On: 01/11/2013 12:36   Dg Chest Port 1 View  01/10/2013   CLINICAL DATA:  Fever, sepsis.  EXAM: PORTABLE CHEST - 1 VIEW  COMPARISON:  05/02/2009  FINDINGS: The film is made with shallow lung inflation. Heart size is exaggerated by shallow inspiration and portable technique but is probably enlarged. There are mild perihilar changes of pulmonary edema. Degenerative changes are seen in the spine.  IMPRESSION: 1. Shallow inflation. 2. Cardiomegaly and probable mild edema.   Electronically Signed   By: Rosalie Gums M.D.   On: 01/10/2013 15:15   Dg Fluoro Guide Lumbar Puncture  01/11/2013   CLINICAL DATA:  Meningitis.  EXAM: DIAGNOSTIC LUMBAR PUNCTURE UNDER FLUOROSCOPIC GUIDANCE  FLUOROSCOPY TIME:  1 min and 1 2nd.  PROCEDURE: Informed consent was obtained from the patient prior to the procedure, including potential complications of  headache, allergy, and pain. I also discussed the patient's Chiari malformation and the slight higher risk of headache. With the patient prone, the lower back was prepped with Betadine. 1% Lidocaine was used for local anesthesia. Lumbar puncture was performed at the L4-5 level using a gauge needle with return of somewhat cloudy CSF with an opening pressure of cm water. Elevenml of CSF were obtained for laboratory studies. The patient tolerated the procedure well and there were no apparent complications.  IMPRESSION: Fluoroscopic guided lumbar puncture with 11 cc of slightly cloudy CSF obtained for appropriate laboratory evaluation. Patient tolerated procedure well without immediate complications.   Electronically Signed   By: Loralie Champagne M.D.   On: 01/11/2013 17:00  Microbiology: Recent Results (from the past 240 hour(s))  CULTURE, BLOOD (ROUTINE X 2)     Status: None   Collection Time    01/10/13 11:30 AM      Result Value Range Status   Specimen Description BLOOD RIGHT ARM   Final   Special Requests BOTTLES DRAWN AEROBIC AND ANAEROBIC 8CC   Final   Culture  Setup Time     Final   Value: 01/12/2013 19:00     Performed at [facility]   Culture     Final   Value: GRAM NEGATIVE COCCOBACILLI     Note: Gram Stain Report Called to,Read Back By and Verified With: Roselie Awkward [facility]) AT 1136 ON 01/12/2013 BY Luetta Nutting M     Performed at [facility]   Report Status PENDING   Incomplete  CULTURE, BLOOD (ROUTINE X 2)     Status: None   Collection Time    01/10/13 11:40 AM      Result Value Range Status   Specimen Description BLOOD LEFT HAND   Final   Special Requests BOTTLES DRAWN AEROBIC ONLY 8CC   Final   Culture NO GROWTH 3 DAYS   Final   Report Status PENDING   Incomplete  MRSA PCR SCREENING     Status: None   Collection Time    01/10/13  6:21 PM      Result Value Range Status   MRSA by PCR NEGATIVE  NEGATIVE Final   Comment:            The GeneXpert MRSA Assay (FDA      approved for NASAL specimens     only), is one component of a     comprehensive MRSA colonization     surveillance program. It is not     intended to diagnose MRSA     infection nor to guide or     monitor treatment for     MRSA infections.  CSF CULTURE     Status: None   Collection Time    01/11/13  3:30 PM      Result Value Range Status   Specimen Description CSF   Final   Special Requests 3.0ML   Final   Gram Stain     Final   Value: CYTOSPIN SLIDE WBC PRESENT,BOTH PMN AND MONONUCLEAR     NO ORGANISMS SEEN     Performed at [facility]     Performed at [facility]   Culture     Final   Value: NO GROWTH     Performed at [facility]   Report Status PENDING   Incomplete  GRAM STAIN     Status: None   Collection Time    01/11/13  3:30 PM      Result Value Range Status   Specimen Description CSF   Final   Special Requests 3.   Final   Gram Stain     Final   Value: WBC PRESENT,BOTH PMN AND MONONUCLEAR     NO ORGANISMS SEEN     CYTOSPIN SLIDE   Report Status 01/11/2013 FINAL   Final  FUNGUS CULTURE W SMEAR     Status: None   Collection Time    01/11/13  3:30 PM      Result Value Range Status   Specimen Description CSF   Final   Special Requests 3.0ML FLUID   Final   Fungal Smear     Final   Value:  NO YEAST OR FUNGAL ELEMENTS SEEN     Performed at [facility]   Culture     Final   Value: CULTURE IN PROGRESS FOR FOUR WEEKS     Performed at [facility]   Report Status PENDING   Incomplete  AFB CULTURE WITH SMEAR     Status: None   Collection Time    01/11/13  3:30 PM      Result Value Range Status   Specimen Description CSF   Final   Special Requests NO 2   Final   ACID FAST SMEAR     Final   Value: NO ACID FAST BACILLI SEEN     Performed at [facility]   Culture     Final   Value: CULTURE WILL BE EXAMINED FOR 6 WEEKS BEFORE ISSUING A FINAL REPORT     Performed at [facility]   Report Status  PENDING   Incomplete  CULTURE, BLOOD (ROUTINE X 2)     Status: None   Collection Time    01/12/13  4:35 PM      Result Value Range Status   Specimen Description BLOOD RIGHT ARM   Final   Special Requests BOTTLES DRAWN AEROBIC ONLY 8CC   Final   Culture  Setup Time     Final   Value: 01/12/2013 22:03     Performed at [facility]   Culture     Final   Value:        BLOOD CULTURE RECEIVED NO GROWTH TO DATE CULTURE WILL BE HELD FOR 5 DAYS BEFORE ISSUING A FINAL NEGATIVE REPORT     Performed at [facility]   Report Status PENDING   Incomplete  CULTURE, BLOOD (ROUTINE X 2)     Status: None   Collection Time    01/12/13  4:40 PM      Result Value Range Status   Specimen Description BLOOD RIGHT HAND   Final   Special Requests BOTTLES DRAWN AEROBIC ONLY 8CC   Final   Culture  Setup Time     Final   Value: 01/12/2013 22:03     Performed at [facility]   Culture     Final   Value:        BLOOD CULTURE RECEIVED NO GROWTH TO DATE CULTURE WILL BE HELD FOR 5 DAYS BEFORE ISSUING A FINAL NEGATIVE REPORT     Performed at [facility]   Report Status PENDING   Incomplete     Labs: Basic Metabolic Panel:  Recent Labs Lab 01/10/13 1133 01/12/13 0411 01/13/13 0420  NA 134* 138 139  K 3.7 4.0 3.9  CL 99 104 104  CO2 25 22 25   GLUCOSE 151* 85 85  BUN 10 17 13   CREATININE 0.91 0.89 0.88  CALCIUM 9.6 8.8 8.7   Liver Function Tests:  Recent Labs Lab 01/10/13 1133 01/12/13 0411 01/13/13 0420  AST 20 13 11   ALT 14 9 9   ALKPHOS 140* 93 90  BILITOT 0.3 0.2* 0.3  PROT 8.2 7.4 7.0  ALBUMIN 3.8 3.0* 2.9*   No results found for this basename: LIPASE, AMYLASE,  in the last 168 hours No results found for this basename: AMMONIA,  in the last 168 hours CBC:  Recent Labs Lab 01/10/13 1133 01/12/13 0411 01/13/13 0420  WBC 13.8* 11.3* 7.5  NEUTROABS 11.4*  --   --   HGB 12.8 11.5* 11.3*  HCT 38.7 34.7* 34.3*  MCV 88.8 88.3 89.1  PLT 317 296 288    Cardiac Enzymes: No results found for this basename: CKTOTAL, CKMB, CKMBINDEX, TROPONINI,  in the last 168 hours BNP: BNP (last 3 results) No results found for this basename: PROBNP,  in the last 8760 hours CBG:  Recent Labs Lab 01/10/13 1149  GLUCAP 127*       Signed:  Moxie Kalil  [facility] 01/13/2013, 12:16 PM

## 2013-01-14 DIAGNOSIS — G000 Hemophilus meningitis: Secondary | ICD-10-CM

## 2013-01-14 LAB — BASIC METABOLIC PANEL WITH GFR
BUN: 14 mg/dL (ref 6–23)
CO2: 26 meq/L (ref 19–32)
Calcium: 8.9 mg/dL (ref 8.4–10.5)
Chloride: 104 meq/L (ref 96–112)
Creatinine, Ser: 0.81 mg/dL (ref 0.50–1.10)
GFR calc Af Amer: >90 mL/min (ref >90)
GFR calc non Af Amer: 80 mL/min — ABNORMAL LOW (ref >90)
Glucose, Bld: 115 mg/dL — ABNORMAL HIGH (ref 70–99)
Potassium: 3.7 meq/L (ref 3.5–5.1)
Sodium: 139 meq/L (ref 135–145)

## 2013-01-14 MED ORDER — DEXTROSE 5 % IV SOLN: 2.0000 g | Freq: Two times a day (BID) | INTRAVENOUS | Status: AC

## 2013-01-14 NOTE — Progress Notes (Signed)
dc'd home to care of daughter, per wc. No acute distress. No chest pain, no sob. Alert oriented, dc instructions and scripts given.

## 2013-01-15 LAB — CSF CULTURE: Culture: NO GROWTH

## 2013-01-15 LAB — CULTURE, BLOOD (ROUTINE X 2)

## 2013-01-15 LAB — CSF CULTURE W GRAM STAIN

## 2013-01-18 LAB — CULTURE, BLOOD (ROUTINE X 2)
Culture: NO GROWTH
Culture: NO GROWTH

## 2013-01-25 ENCOUNTER — Ambulatory Visit (INDEPENDENT_AMBULATORY_CARE_PROVIDER_SITE_OTHER): Payer: BC Managed Care – PPO | Admitting: Infectious Diseases

## 2013-01-25 ENCOUNTER — Telehealth: Payer: Self-pay | Admitting: *Deleted

## 2013-01-25 ENCOUNTER — Encounter: Payer: Self-pay | Admitting: Infectious Diseases

## 2013-01-25 VITALS — BP 128/84 | HR 73 | Temp 98.0°F | Ht 62.0 in | Wt 247.0 lb

## 2013-01-25 DIAGNOSIS — Z1231 Encounter for screening mammogram for malignant neoplasm of breast: Secondary | ICD-10-CM

## 2013-01-25 DIAGNOSIS — G009 Bacterial meningitis, unspecified: Secondary | ICD-10-CM

## 2013-01-25 DIAGNOSIS — Z0000 Encounter for general adult medical examination without abnormal findings: Secondary | ICD-10-CM

## 2013-01-25 NOTE — Progress Notes (Signed)
  Subjective:    Patient ID: Caroline Velez, female    DOB: [birth year], 56 y.o.   MRN: [id number]  HPI 57 yo F who was adm on 11-2 with fever, mental status change, n/v. She was found to have temp 101, WBC 13k, and MRI suggestive of meningitis. She did have LP which showed 3280, 85% N. Her  1/2 BCx grew H flu (beta lactamase+). CSF (-). She was treated with ceftriaxone.  D/C home on 01-14-13.   No problems with anbx, no rashes, no diarrhea, no yeast infections.  No f/c. Maybe 2 headaches since d/c from hospital   Review of Systems See HPI.     Objective:   Physical Exam  Constitutional: She is oriented to person, place, and time. She appears well-developed and well-nourished.  HENT:  Mouth/Throat: No oropharyngeal exudate.  Eyes: EOM are normal. Pupils are equal, round, and reactive to light.  Neck: Normal range of motion. Neck supple.  Cardiovascular: Normal rate, regular rhythm and normal heart sounds.   Pulmonary/Chest: Effort normal and breath sounds normal.  Abdominal: Soft. Bowel sounds are normal. She exhibits no distension. There is no tenderness.  Musculoskeletal: She exhibits no edema.       Arms: Lymphadenopathy:    She has no cervical adenopathy.  Neurological: She is alert and oriented to person, place, and time. She exhibits normal muscle tone. Coordination normal.          Assessment & Plan:

## 2013-01-25 NOTE — Assessment & Plan Note (Signed)
Will set her up for mammogram, has been > 2 years, +Fhx.

## 2013-01-25 NOTE — Assessment & Plan Note (Signed)
She's doing well. Will pull her PIC line. Consider her treated. She can f/u with PCP. rec for her to get flu shot, she refuses. She had 2 HIV Ab (-) in hospital.

## 2013-01-25 NOTE — Telephone Encounter (Signed)
Notified [facility] that the patient's PICC was pulled during her office visit. Andree Coss, RN

## 2013-01-25 NOTE — Addendum Note (Signed)
Addended by: Andree Coss on: 01/25/2013 11:21 AM   Modules accepted: Orders

## 2013-02-01 LAB — CULTURE, BLOOD (ROUTINE X 2)

## 2013-02-08 LAB — FUNGUS CULTURE W SMEAR: Fungal Smear: NONE SEEN

## 2013-02-23 LAB — AFB CULTURE WITH SMEAR (NOT AT ARMC)

## 2014-01-06 ENCOUNTER — Emergency Department (HOSPITAL_COMMUNITY): Payer: BC Managed Care – PPO

## 2014-01-06 ENCOUNTER — Observation Stay (HOSPITAL_COMMUNITY)
Admission: EM | Admit: 2014-01-06 | Discharge: 2014-01-07 | Disposition: A | Discharge status: Home / Self Care | Payer: BC Managed Care – PPO | Attending: Emergency Medicine | Admitting: Emergency Medicine

## 2014-01-06 ENCOUNTER — Encounter (HOSPITAL_COMMUNITY): Payer: Self-pay | Admitting: Emergency Medicine

## 2014-01-06 DIAGNOSIS — G000 Hemophilus meningitis: Secondary | ICD-10-CM | POA: Diagnosis not present

## 2014-01-06 DIAGNOSIS — R0789 Other chest pain: Secondary | ICD-10-CM

## 2014-01-06 DIAGNOSIS — R079 Chest pain, unspecified: Principal | ICD-10-CM | POA: Insufficient documentation

## 2014-01-06 DIAGNOSIS — R0600 Dyspnea, unspecified: Secondary | ICD-10-CM | POA: Insufficient documentation

## 2014-01-06 DIAGNOSIS — R61 Generalized hyperhidrosis: Secondary | ICD-10-CM | POA: Insufficient documentation

## 2014-01-06 DIAGNOSIS — Z791 Long term (current) use of non-steroidal anti-inflammatories (NSAID): Secondary | ICD-10-CM | POA: Diagnosis not present

## 2014-01-06 DIAGNOSIS — R42 Dizziness and giddiness: Secondary | ICD-10-CM | POA: Insufficient documentation

## 2014-01-06 LAB — CBC WITH DIFFERENTIAL/PLATELET
BASOS ABS: 0 10*3/uL (ref 0.0–0.1)
BASOS PCT: 1 % (ref 0–1)
EOS ABS: 0.2 10*3/uL (ref 0.0–0.7)
Eosinophils Relative: 4 % (ref 0–5)
HCT: 36.4 % (ref 36.0–46.0)
HEMOGLOBIN: 12 g/dL (ref 12.0–15.0)
Lymphocytes Relative: 44 % (ref 12–46)
Lymphs Abs: 2.4 10*3/uL (ref 0.7–4.0)
MCH: 29 pg (ref 26.0–34.0)
MCHC: 33 g/dL (ref 30.0–36.0)
MCV: 87.9 fL (ref 78.0–100.0)
Monocytes Absolute: 0.5 10*3/uL (ref 0.1–1.0)
Monocytes Relative: 10 % (ref 3–12)
NEUTROS ABS: 2.2 10*3/uL (ref 1.7–7.7)
Neutrophils Relative %: 41 % — ABNORMAL LOW (ref 43–77)
PLATELETS: 316 10*3/uL (ref 150–400)
RBC: 4.14 MIL/uL (ref 3.87–5.11)
RDW: 14.6 % (ref 11.5–15.5)
WBC: 5.3 10*3/uL (ref 4.0–10.5)

## 2014-01-06 LAB — BASIC METABOLIC PANEL
Anion gap: 12 (ref 5–15)
BUN: 16 mg/dL (ref 6–23)
CO2: 26 mEq/L (ref 19–32)
Calcium: 9.6 mg/dL (ref 8.4–10.5)
Chloride: 105 mEq/L (ref 96–112)
Creatinine, Ser: 0.81 mg/dL (ref 0.50–1.10)
GFR, EST NON AFRICAN AMERICAN: 79 mL/min — AB (ref >90)
Glucose, Bld: 96 mg/dL (ref 70–99)
POTASSIUM: 4 meq/L (ref 3.7–5.3)
SODIUM: 143 meq/L (ref 137–147)

## 2014-01-06 LAB — TROPONIN I: Troponin I: <0.3 ng/mL (ref <0.30)

## 2014-01-06 MED ORDER — ONDANSETRON HCL 4 MG PO TABS: 4.0000 mg | ORAL_TABLET | Freq: Four times a day (QID) | ORAL | Status: DC | PRN

## 2014-01-06 MED ORDER — SODIUM CHLORIDE 0.9 % IJ SOLN
3.0000 mL | Freq: Two times a day (BID) | INTRAMUSCULAR | Status: DC
  Administered 2014-01-06 – 2014-01-07 (×2): 3 mL via INTRAVENOUS

## 2014-01-06 MED ORDER — ONDANSETRON HCL 4 MG/2ML IJ SOLN: 4.0000 mg | Freq: Four times a day (QID) | INTRAMUSCULAR | Status: DC | PRN

## 2014-01-06 MED ORDER — NAPROXEN 250 MG PO TABS
250.0000 mg | ORAL_TABLET | Freq: Two times a day (BID) | ORAL | Status: DC
  Administered 2014-01-06 – 2014-01-07 (×3): 250 mg via ORAL
  Filled 2014-01-06 (×3): qty 1

## 2014-01-06 MED ORDER — HEPARIN SODIUM (PORCINE) 5000 UNIT/ML IJ SOLN
5000.0000 [IU] | Freq: Three times a day (TID) | INTRAMUSCULAR | Status: DC
  Filled 2014-01-06: qty 1

## 2014-01-06 MED ORDER — NAPROXEN SODIUM 220 MG PO TABS: 220.0000 mg | ORAL_TABLET | Freq: Two times a day (BID) | ORAL | Status: DC

## 2014-01-06 NOTE — Consult Note (Signed)
Primary cardiologist: Consulting cardiologist:  Clinical Summary Caroline Velez is a 59 y.o.female with reported history of hyperlipidemia and some family history of CAD presents with chest pain. Reports symptoms started approx 3 weeks. Describes a 10/10 pressure in chest with associated SOB and dizziness. Lasts approx 3-4 minutes. Can occur at rest or with exertion. Can be made worst with position. Notes over the last 3 weeks increase in frequency and severity, symptoms currently occuring ever other day. She also reports increased DOE over this same time period, symptoms now at approx 1/2. No LE edema. + 2 pillow orthopnea that is also fairly new for her.   CAD risk factors: age, HL, maternal uncle MIs in his 67s   Trop neg x1, K 4.0, Cr 0.81, Hgb 12, Plt 316 CXR no acute process EKG SR, PACs, no ischemic changes.   No Known Allergies   Past Medical History  Diagnosis Date  . Allergy   . 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 Breast surgery      bilateral    Family History  Problem Relation Age of Onset  . Cancer Mother     breast    Social History Caroline Velez reports that she has never smoked. She does not have any smokeless tobacco history on file. Caroline Velez reports that she does not drink alcohol.  Review of Systems CONSTITUTIONAL: No weight loss, fever, chills, weakness or fatigue.  HEENT: Eyes: No visual loss, blurred vision, double vision or yellow sclerae. No hearing loss, sneezing, congestion, runny nose or sore throat.  SKIN: No rash or itching.  CARDIOVASCULAR: per HPI RESPIRATORY: No cough or sputum.  GASTROINTESTINAL: No anorexia, nausea, vomiting or diarrhea. No abdominal pain or blood.  GENITOURINARY: no polyuria, no dysuria NEUROLOGICAL: No headache, dizziness, syncope, paralysis, ataxia, numbness or tingling in the extremities. No change in bowel or bladder control.  MUSCULOSKELETAL: No muscle,  back pain, joint pain or stiffness.  HEMATOLOGIC: No anemia, bleeding or bruising.  LYMPHATICS: No enlarged nodes. No history of splenectomy.  PSYCHIATRIC: No history of depression or anxiety.      Physical Examination Blood pressure 117/60, pulse 77, temperature 97.9 F (36.6 C), resp. rate 18, height 5\' 2"  (1.575 m), weight 256 lb (116.121 kg), SpO2 97.00%. No intake or output data in the 24 hours ending 01/06/14 1507  HEENT: sclera clear  Cardiovascular: RRR, no m/r/g,no JVD, no carotid bruits  Respiratory: CTAB  GI: abdomen soft, NT, ND  MSK: no LE edema  Neuro: no focal deficits  Psych: appropriate affect   Lab Results  Basic Metabolic Panel:  Recent Labs Lab 01/06/14 1238  NA 143  K 4.0  CL 105  CO2 26  GLUCOSE 96  BUN 16  CREATININE 0.81  CALCIUM 9.6    Liver Function Tests: No results found for this basename: AST, ALT, ALKPHOS, BILITOT, PROT, ALBUMIN,  in the last 168 hours  CBC:  Recent Labs Lab 01/06/14 1238  WBC 5.3  NEUTROABS 2.2  HGB 12.0  HCT 36.4  MCV 87.9  PLT 316    Cardiac Enzymes:  Recent Labs Lab 01/06/14 1238  TROPONINI <0.30    BNP: No components found with this basename: POCBNP,     Impression/Recommendations 1. Chest pain Unclear etiology, description is somewhat mixed for cardiac cause. Concerning that symptoms are progressing, and also has developed progressing significant DOE over the same time period - recommend observation overnight with  serial EKGs and cardiac enzymes, obtain echo in AM. Pending initial testing may need ischemic testing. Please check lipid panel and HgbA1c   Carlyle Dolly, M.D.

## 2014-01-06 NOTE — ED Notes (Signed)
Pt c/o central cp x 1 week with some sob/dizziness/sweating. denies cough. nad noted.

## 2014-01-06 NOTE — ED Notes (Signed)
Cardiology at bedside.

## 2014-01-06 NOTE — H&P (Signed)
[facility] History and Physical  Caroline Velez EUM:[id number] DOB: 09-28-1955 DOA: 01/06/2014  Referring physician: ER PCP: Rosita Fire, MD   Chief Complaint: Chest pain  HPI: Caroline Velez is a 58 y.o. female  This is a 58 year old obese lady with a history of hyperlipidemia who presents with chest pain. She says that the chest pain developed approximately 2-3 weeks ago and she describes a pressure sensation in her chest associated with dyspnea and dizziness. The pain lasts 3-4 minutes and can occur at rest or with exertion. Apparently gets worse with position also. She is now being admitted for further investigation. She is a nonsmoker, is not diabetic and does not have history of hypertension.   Review of Systems:  Constitutional:  No weight loss, night sweats, Fevers, chills, fatigue.  HEENT:  No headaches, Difficulty swallowing,Tooth/dental problems,Sore throat,  No sneezing, itching, ear ache, nasal congestion, post nasal drip,   GI:  No heartburn, indigestion, abdominal pain, nausea, vomiting, diarrhea, change in bowel habits, loss of appetite  Resp:  No shortness of breath with exertion or at rest. No excess mucus, no productive cough, No non-productive cough, No coughing up of blood.No change in color of mucus.No wheezing.No chest wall deformity  Skin:  no rash or lesions.  GU:  no dysuria, change in color of urine, no urgency or frequency. No flank pain.  Musculoskeletal:  No joint pain or swelling. No decreased range of motion. No back pain.  Psych:  No change in mood or affect. No depression or anxiety. No memory loss.   Past Medical History  Diagnosis Date  . Allergy   . 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 Breast surgery      bilateral   Social History:  reports that she has never smoked. She does not have any smokeless tobacco history on file. She reports that she does not drink  alcohol or use illicit drugs.  No Known Allergies  Family History  Problem Relation Age of Onset  . Cancer Mother     breast     Prior to Admission medications   Medication Sig Start Date End Date Taking? Authorizing Provider  naproxen sodium (ALEVE) 220 MG tablet Take 220 mg by mouth 2 (two) times daily with a meal.   Yes Historical Provider, MD  aspirin-acetaminophen-caffeine (EXCEDRIN MIGRAINE) 540-086-76 MG per tablet Take 1 tablet by mouth every 6 (six) hours as needed for pain.    Historical Provider, MD   Physical Exam: Filed Vitals:   01/06/14 1332 01/06/14 1500 01/06/14 1600 01/06/14 1645  BP: 117/60 123/66 120/68 148/71  Pulse: 77 71 79 78  Temp:    98.2 F (36.8 C)  TempSrc:    Oral  Resp: 18 16 24 20   Height:    5\' 2"  (1.575 m)  Weight:    112.038 kg (247 lb)  SpO2: 97%   99%    Wt Readings from Last 3 Encounters:  01/06/14 112.038 kg (247 lb)  01/25/13 112.038 kg (247 lb)  01/10/13 114.3 kg (251 lb 15.8 oz)    General:  Appears calm and comfortable. Obese.  Eyes: PERRL, normal lids, irises & conjunctiva ENT: grossly normal hearing, lips & tongue Neck: no LAD, masses or thyromegaly Cardiovascular: RRR, no m/r/g. No LE edema. Telemetry: SR, no arrhythmias  Respiratory: CTA bilaterally, no w/r/r. Normal respiratory effort. Abdomen: soft, ntnd Skin: no rash or induration seen on limited exam Musculoskeletal: grossly normal  tone BUE/BLE Psychiatric: grossly normal mood and affect, speech fluent and appropriate Neurologic: grossly non-focal.          Labs on Admission:  Basic Metabolic Panel:  Recent Labs Lab 01/06/14 1238  NA 143  K 4.0  CL 105  CO2 26  GLUCOSE 96  BUN 16  CREATININE 0.81  CALCIUM 9.6   Liver Function Tests: No results found for this basename: AST, ALT, ALKPHOS, BILITOT, PROT, ALBUMIN,  in the last 168 hours No results found for this basename: LIPASE, AMYLASE,  in the last 168 hours No results found for this basename:  AMMONIA,  in the last 168 hours CBC:  Recent Labs Lab 01/06/14 1238  WBC 5.3  NEUTROABS 2.2  HGB 12.0  HCT 36.4  MCV 87.9  PLT 316   Cardiac Enzymes:  Recent Labs Lab 01/06/14 1238  TROPONINI <0.30    BNP (last 3 results) No results found for this basename: PROBNP,  in the last 8760 hours CBG: No results found for this basename: GLUCAP,  in the last 168 hours  Radiological Exams on Admission: Dg Chest Portable 1 View  01/06/2014   CLINICAL DATA:  Chest pain for 3 weeks  EXAM: PORTABLE CHEST - 1 VIEW  COMPARISON:  04/28/2013  FINDINGS: Cardiomediastinal silhouette is stable. Suboptimal study due to patient's large body habitus. No acute infiltrate or pleural effusion. No pulmonary edema.  IMPRESSION: No active disease.   Electronically Signed   By: Lahoma Crocker M.D.   On: 01/06/2014 13:19    EKG: Independently reviewnormal sinus rhythm, PACs, no acute ST-T wave changes.   Assessment/Plan 1. chest pain, unclear etiology. She has a couple risk factors for coronary artery disease and the pain description is somewhat typical. 2. Obesity. 3. Hyperlipidemia.  Plan: 1. Admit to medical floor. 2. Serial cardiac enzymes. 3. Cardiology consultation is appreciated. She may need stress testing.  Further recommendations will depend on patient's hospital progress.  Code Status: Full code.  DVT Prophylaxis: heparin.   Family Communication: I discussed the plan with the patient at the bedside.     Time spent:  60 minutes. Doree Albee [facility] Pager [phone].

## 2014-01-06 NOTE — ED Provider Notes (Signed)
CSN: [id number]     Arrival date & time 01/06/14  1219 History   First MD Initiated Contact with Patient 01/06/14 1256     Chief Complaint  Patient presents with  . Chest Pain     (Consider location/radiation/quality/duration/timing/severity/associated sxs/prior Treatment) HPI.... Intermittent chest pain for 2-3 weeks described as pressure and central in location with radiation to left arm.  Associated symptoms  diaphoresis, dyspnea and dizziness. Nothing makes symptoms better or worse. Cardiac risk factors include hypercholesterolemia. Nonsmoker. No diabetes or hypertension. She was seen by her primary care doctor today [Dr. Fanta] and sent to the emergency department. Severity is moderate. Patient received aspirin in the office Past Medical History  Diagnosis Date  . Allergy   . 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 Breast surgery      bilateral   Family History  Problem Relation Age of Onset  . Cancer Mother     breast   History  Substance Use Topics  . Smoking status: Never Smoker   . Smokeless tobacco: Not on file  . Alcohol Use: No   OB History   Grav Para Term Preterm Abortions TAB SAB Ect Mult Living                 Review of Systems  All other systems reviewed and are negative.     Allergies  Review of patient's allergies indicates no known allergies.  Home Medications   Prior to Admission medications   Medication Sig Start Date End Date Taking? Authorizing Provider  naproxen sodium (ALEVE) 220 MG tablet Take 220 mg by mouth 2 (two) times daily with a meal.   Yes Historical Provider, MD  aspirin-acetaminophen-caffeine (EXCEDRIN MIGRAINE) 937-169-67 MG per tablet Take 1 tablet by mouth every 6 (six) hours as needed for pain.    Historical Provider, MD   BP 123/66  Pulse 71  Temp(Src) 97.9 F (36.6 C)  Resp 16  Ht 5\' 2"  (1.575 m)  Wt 256 lb (116.121 kg)  BMI 46.81 kg/m2  SpO2 97% Physical  Exam  Nursing note and vitals reviewed. Constitutional: She is oriented to person, place, and time. She appears well-developed and well-nourished.  HENT:  Head: Normocephalic and atraumatic.  Eyes: Conjunctivae and EOM are normal. Pupils are equal, round, and reactive to light.  Neck: Normal range of motion. Neck supple.  Cardiovascular: Normal rate, regular rhythm and normal heart sounds.   Pulmonary/Chest: Effort normal and breath sounds normal.  Abdominal: Soft. Bowel sounds are normal.  Musculoskeletal: Normal range of motion.  Neurological: She is alert and oriented to person, place, and time.  Skin: Skin is warm and dry.  Psychiatric: She has a normal mood and affect. Her behavior is normal.    ED Course  Procedures (including critical care time) Labs Review Labs Reviewed  CBC WITH DIFFERENTIAL - Abnormal; Notable for the following:    Neutrophils Relative % 41 (*)    All other components within normal limits  BASIC METABOLIC PANEL - Abnormal; Notable for the following:    GFR calc non Af Amer 79 (*)    All other components within normal limits  TROPONIN I    Imaging Review Dg Chest Portable 1 View  01/06/2014   CLINICAL DATA:  Chest pain for 3 weeks  EXAM: PORTABLE CHEST - 1 VIEW  COMPARISON:  04/28/2013  FINDINGS: Cardiomediastinal silhouette is stable. Suboptimal study due to patient's large body habitus. No  acute infiltrate or pleural effusion. No pulmonary edema.  IMPRESSION: No active disease.   Electronically Signed   By: Lahoma Crocker M.D.   On: 01/06/2014 13:19     EKG Interpretation   Date/Time:  Thursday January 06 2014 12:31:59 EDT Ventricular Rate:  70 PR Interval:  118 QRS Duration: 79 QT Interval:  382 QTC Calculation: 412 R Axis:   17 Text Interpretation:  Sinus rhythm Atrial premature complexes Borderline  short PR interval Low voltage, precordial leads Confirmed by Lacinda Axon  MD,  Fusaye Wachtel ([id number]) on 01/06/2014 12:34:39 PM Also confirmed by Lacinda Axon  MD, [place]   [phone])  on 01/06/2014 1:05:19 PM      MDM   Final diagnoses:  Chest pain    Discuss with Dr. Harl Bowie cardiologist.   He recommended admission to hospital service for rule out. Patient is hemodynamically stable.    Nat Christen, MD 01/06/14 [phone]

## 2014-01-07 ENCOUNTER — Telehealth: Payer: Self-pay

## 2014-01-07 DIAGNOSIS — R079 Chest pain, unspecified: Secondary | ICD-10-CM | POA: Diagnosis not present

## 2014-01-07 DIAGNOSIS — R61 Generalized hyperhidrosis: Secondary | ICD-10-CM | POA: Diagnosis not present

## 2014-01-07 DIAGNOSIS — R42 Dizziness and giddiness: Secondary | ICD-10-CM | POA: Diagnosis not present

## 2014-01-07 DIAGNOSIS — I369 Nonrheumatic tricuspid valve disorder, unspecified: Secondary | ICD-10-CM

## 2014-01-07 DIAGNOSIS — R0600 Dyspnea, unspecified: Secondary | ICD-10-CM | POA: Diagnosis not present

## 2014-01-07 LAB — COMPREHENSIVE METABOLIC PANEL
ALT: 23 U/L (ref 0–35)
AST: 23 U/L (ref 0–37)
Albumin: 3.5 g/dL (ref 3.5–5.2)
Alkaline Phosphatase: 103 U/L (ref 39–117)
Anion gap: 11 (ref 5–15)
BILIRUBIN TOTAL: 0.3 mg/dL (ref 0.3–1.2)
BUN: 16 mg/dL (ref 6–23)
CHLORIDE: 104 meq/L (ref 96–112)
CO2: 26 meq/L (ref 19–32)
CREATININE: 0.84 mg/dL (ref 0.50–1.10)
Calcium: 9.3 mg/dL (ref 8.4–10.5)
GFR calc Af Amer: 88 mL/min — ABNORMAL LOW (ref >90)
GFR, EST NON AFRICAN AMERICAN: 76 mL/min — AB (ref >90)
Glucose, Bld: 89 mg/dL (ref 70–99)
POTASSIUM: 4 meq/L (ref 3.7–5.3)
Sodium: 141 mEq/L (ref 137–147)
Total Protein: 7.5 g/dL (ref 6.0–8.3)

## 2014-01-07 LAB — TROPONIN I
Troponin I: <0.3 ng/mL (ref <0.30)
Troponin I: <0.3 ng/mL (ref <0.30)

## 2014-01-07 LAB — HEMOGLOBIN A1C
Hgb A1c MFr Bld: 6.7 % — ABNORMAL HIGH (ref <5.7)
MEAN PLASMA GLUCOSE: 146 mg/dL — AB (ref <117)

## 2014-01-07 LAB — TSH: TSH: 0.646 u[IU]/mL (ref 0.350–4.500)

## 2014-01-07 MED ORDER — ASPIRIN EC 81 MG PO TBEC
81.0000 mg | DELAYED_RELEASE_TABLET | Freq: Every day | ORAL | Status: DC
  Administered 2014-01-07: 81 mg via ORAL
  Filled 2014-01-07: qty 1

## 2014-01-07 MED ORDER — ASPIRIN 81 MG PO TBEC: 81.0000 mg | DELAYED_RELEASE_TABLET | Freq: Every day | ORAL | Status: DC

## 2014-01-07 NOTE — Telephone Encounter (Signed)
Message copied by Bernita Raisin on Fri Jan 07, 2014  3:07 PM ------      Message from: Lestine Mount      Created: Fri Jan 07, 2014  2:51 PM      Regarding: NEED ORDER       PER BRANCH PT NEEDS GXT DX CP  ------

## 2014-01-07 NOTE — Progress Notes (Signed)
Willow Ora discharged home per MD order.  Discharge instructions reviewed and discussed with the patient, all questions and concerns answered. Copy of instructions and scripts given to patient.    Medication List         ALEVE 220 MG tablet  Generic drug:  naproxen sodium  Take 220 mg by mouth 2 (two) times daily with a meal.     aspirin 81 MG EC tablet  Take 1 tablet (81 mg total) by mouth daily.     aspirin-acetaminophen-caffeine 201-007-12 MG per tablet  Commonly known as:  EXCEDRIN MIGRAINE  Take 1 tablet by mouth every 6 (six) hours as needed for pain.        Patients skin is clean, dry and intact, no evidence of skin break down. IV site discontinued and catheter remains intact. Site without signs and symptoms of complications. Dressing and pressure applied.  Patient escorted to car by Loma Sousa, NT in a wheelchair,  no distress noted upon discharge.  Regino Bellow 01/07/2014 6:41 PM

## 2014-01-07 NOTE — Progress Notes (Signed)
Patient ID: Caroline Velez, female   DOB: [birth year], 57 y.o.   MRN: [id number]    Subjective:    No pain overnight.   Objective:   Temp:  [97.5 F (36.4 C)-98.2 F (36.8 C)] 97.5 F (36.4 C) (10/30 0550) Pulse Rate:  [63-79] 63 (10/30 0550) Resp:  [16-24] 20 (10/30 0550) BP: (103-148)/(55-99) 118/74 mmHg (10/30 0550) SpO2:  [97 %-100 %] 100 % (10/30 0550) Weight:  [247 lb (112.038 kg)-256 lb (116.121 kg)] 254 lb 8 oz (115.44 kg) (10/30 0550) Last BM Date: 01/05/14  Filed Weights   01/06/14 1227 01/06/14 1645 01/07/14 0550  Weight: 256 lb (116.121 kg) 247 lb (112.038 kg) 254 lb 8 oz (115.44 kg)   No intake or output data in the 24 hours ending 01/07/14 0837  Telemetry: NSR  Exam:  General: NAD  Resp: CTAB  Cardiac: RRR, no m/r/g, no JVD,no carotid bruits  GI: abdomen soft, NT, ND  MSK: no LE edema  Neuro: no focal deficits  Psych: appropriate affect  Lab Results:  Basic Metabolic Panel:  Recent Labs Lab 01/06/14 1238 01/07/14 0601  NA 143 141  K 4.0 4.0  CL 105 104  CO2 26 26  GLUCOSE 96 89  BUN 16 16  CREATININE 0.81 0.84  CALCIUM 9.6 9.3    Liver Function Tests:  Recent Labs Lab 01/07/14 0601  AST 23  ALT 23  ALKPHOS 103  BILITOT 0.3  PROT 7.5  ALBUMIN 3.5    CBC:  Recent Labs Lab 01/06/14 1238  WBC 5.3  HGB 12.0  HCT 36.4  MCV 87.9  PLT 316    Cardiac Enzymes:  Recent Labs Lab 01/06/14 1737 01/06/14 2336 01/07/14 0601  TROPONINI <0.30 <0.30 <0.30    BNP: No results found for this basename: PROBNP,  in the last 8760 hours  Coagulation: No results found for this basename: INR,  in the last 168 hours  ECG:   Medications:   Scheduled Medications: . heparin  5,000 Units Subcutaneous 3 times per day  . naproxen  250 mg Oral BID WC  . sodium chloride  3 mL Intravenous Q12H     Infusions:     PRN Medications:  ondansetron (ZOFRAN) IV, ondansetron     Assessment/Plan   1. Chest pain  Unclear  etiology, description is somewhat mixed for cardiac cause. Concerning that symptoms are progressing, and also has developed progressing significant DOE over the same time period  - Trop neg x 4, EKG without ischemic changes  - newly diagnosed DM as additional risk factor for CAD, does not appear FLP was ordered. Can have done as outpatient, she likely will require statin given her hx of DM - f/u echo results this AM, pending results consider inpatient vs outpatient stress testing. - patient ate breakfast approx 830AM, if needs stress test would have to be in afternoon.   2. Diabetes - Hgb A1c 6.7, appears to be new diagnosis for patient. Defer management to primary team.  - continue ASA for primary prevention in diabetic patient      Carlyle Dolly, M.D.

## 2014-01-07 NOTE — Care Management Note (Signed)
    Page 1 of 1   01/07/2014     11:06:56 AM CARE MANAGEMENT NOTE 01/07/2014  Patient:  Caroline Velez, Caroline Velez   Account Number:  [id number]  Date Initiated:  01/07/2014  Documentation initiated by:  Theophilus Kinds  Subjective/Objective Assessment:   Pt admitted from home with CP. Pt lives alone and will return home at discharge. Pt is independent with ADL's.     Action/Plan:   No CM needs noted.   Anticipated DC Date:  01/08/2014   Anticipated DC Plan:  [place]  CM consult      Choice offered to / List presented to:             Status of service:  Completed, signed off Medicare Important Message given?   (If response is "NO", the following Medicare IM given date fields will be blank) Date Medicare IM given:   Medicare IM given by:   Date Additional Medicare IM given:   Additional Medicare IM given by:    Discharge Disposition:  HOME/SELF CARE  Per UR Regulation:    If discussed at Long Length of Stay Meetings, dates discussed:    Comments:  01/07/14 [place], RN BSN CM

## 2014-01-07 NOTE — Progress Notes (Signed)
Inpatient Diabetes Program Recommendations  AACE/ADA: New Consensus Statement on Inpatient Glycemic Control (2013)  Target Ranges:  Prepandial:   less than 140 mg/dL      Peak postprandial:   less than 180 mg/dL (1-2 hours)      Critically ill patients:  140 - 180 mg/dL   Reason for assessment: Elevated A1C  Diabetes history: None Outpatient Diabetes medications: None Current orders for Inpatient glycemic control: None  No previous history of diabetes or mention of elevated CBG in history.   A1C 6.7% this admission.  ADA diagnosis criteria for diabetes is A1C>6.5%. Please assess.  Thanks,   Gentry Fitz, RN, BA, MHA, CDE Diabetes Coordinator Inpatient Diabetes Program  [phone] (Team Pager) [phone] [facility]) 01/07/2014 8:02 AM

## 2014-01-07 NOTE — Progress Notes (Signed)
Subjective: 58 years old female with history of hyperlipidemia and obesity was admitted due to chest pain. Serial EKG and cardiac enzymes were negative. Patient feels better.  Objective: Vital signs in last 24 hours: Temp:  [97.5 F (36.4 C)-98.2 F (36.8 C)] 97.5 F (36.4 C) (10/30 0550) Pulse Rate:  [63-79] 63 (10/30 0550) Resp:  [16-24] 20 (10/30 0550) BP: (103-148)/(55-99) 118/74 mmHg (10/30 0550) SpO2:  [97 %-100 %] 100 % (10/30 0550) Weight:  [112.038 kg (247 lb)-116.121 kg (256 lb)] 115.44 kg (254 lb 8 oz) (10/30 0550) Weight change:  Last BM Date: 01/05/14  Intake/Output from previous day:    PHYSICAL EXAM General appearance: alert and no distress Resp: clear to auscultation bilaterally Cardio: S1, S2 normal GI: soft, non-tender; bowel sounds normal; no masses,  no organomegaly Extremities: extremities normal, atraumatic, no cyanosis or edema  Lab Results:  Results for orders placed during the hospital encounter of 01/06/14 (from the past 48 hour(s))  CBC WITH DIFFERENTIAL     Status: Abnormal   Collection Time    01/06/14 12:38 PM      Result Value Ref Range   WBC 5.3  4.0 - 10.5 K/uL   RBC 4.14  3.87 - 5.11 MIL/uL   Hemoglobin 12.0  12.0 - 15.0 g/dL   HCT 36.4  36.0 - 46.0 %   MCV 87.9  78.0 - 100.0 fL   MCH 29.0  26.0 - 34.0 pg   MCHC 33.0  30.0 - 36.0 g/dL   RDW 14.6  11.5 - 15.5 %   Platelets 316  150 - 400 K/uL   Neutrophils Relative % 41 (*) 43 - 77 %   Neutro Abs 2.2  1.7 - 7.7 K/uL   Lymphocytes Relative 44  12 - 46 %   Lymphs Abs 2.4  0.7 - 4.0 K/uL   Monocytes Relative 10  3 - 12 %   Monocytes Absolute 0.5  0.1 - 1.0 K/uL   Eosinophils Relative 4  0 - 5 %   Eosinophils Absolute 0.2  0.0 - 0.7 K/uL   Basophils Relative 1  0 - 1 %   Basophils Absolute 0.0  0.0 - 0.1 K/uL  BASIC METABOLIC PANEL     Status: Abnormal   Collection Time    01/06/14 12:38 PM      Result Value Ref Range   Sodium 143  137 - 147 mEq/L   Potassium 4.0  3.7 - 5.3 mEq/L    Chloride 105  96 - 112 mEq/L   CO2 26  19 - 32 mEq/L   Glucose, Bld 96  70 - 99 mg/dL   BUN 16  6 - 23 mg/dL   Creatinine, Ser 0.81  0.50 - 1.10 mg/dL   Calcium 9.6  8.4 - 10.5 mg/dL   GFR calc non Af Amer 79 (*) >90 mL/min   GFR calc Af Amer >90  >90 mL/min   Comment: (NOTE)     The eGFR has been calculated using the CKD EPI equation.     This calculation has not been validated in all clinical situations.     eGFR's persistently <90 mL/min signify possible Chronic Kidney     Disease.   Anion gap 12  5 - 15  TROPONIN I     Status: None   Collection Time    01/06/14 12:38 PM      Result Value Ref Range   Troponin I <0.30  <0.30 ng/mL   Comment:  Due to the release kinetics of cTnI,     a negative result within the first hours     of the onset of symptoms does not rule out     myocardial infarction with certainty.     If myocardial infarction is still suspected,     repeat the test at appropriate intervals.  TSH     Status: None   Collection Time    01/06/14  5:37 PM      Result Value Ref Range   TSH 0.646  0.350 - 4.500 uIU/mL   Comment: Performed at [facility]  TROPONIN I     Status: None   Collection Time    01/06/14  5:37 PM      Result Value Ref Range   Troponin I <0.30  <0.30 ng/mL   Comment:            Due to the release kinetics of cTnI,     a negative result within the first hours     of the onset of symptoms does not rule out     myocardial infarction with certainty.     If myocardial infarction is still suspected,     repeat the test at appropriate intervals.  HEMOGLOBIN A1C     Status: Abnormal   Collection Time    01/06/14  5:37 PM      Result Value Ref Range   Hemoglobin A1C 6.7 (*) <5.7 %   Comment: (NOTE)                                                                               According to the ADA Clinical Practice Recommendations for 2011, when     HbA1c is used as a screening test:      >=6.5%   Diagnostic of Diabetes  [place]               (if abnormal result is confirmed)     5.7-6.4%   Increased risk of developing Diabetes Mellitus     References:Diagnosis and Classification of Diabetes Mellitus,Diabetes     LNLG,9211,94(RDEYC 1):S62-S69 and Standards of Medical Care in             Diabetes - 2011,Diabetes Care,2011,34 (Suppl 1):S11-S61.   Mean Plasma Glucose 146 (*) <117 mg/dL   Comment: Performed at [facility]  TROPONIN I     Status: None   Collection Time    01/06/14 11:36 PM      Result Value Ref Range   Troponin I <0.30  <0.30 ng/mL   Comment:            Due to the release kinetics of cTnI,     a negative result within the first hours     of the onset of symptoms does not rule out     myocardial infarction with certainty.     If myocardial infarction is still suspected,     repeat the test at appropriate intervals.    ABGS No results found for this basename: PHART, PCO2, PO2ART, TCO2, HCO3,  in the last 72 hours CULTURES No results found for this or any previous visit (from the past 240  hour(s)). Studies/Results: Dg Chest Portable 1 View  01/06/2014   CLINICAL DATA:  Chest pain for 3 weeks  EXAM: PORTABLE CHEST - 1 VIEW  COMPARISON:  04/28/2013  FINDINGS: Cardiomediastinal silhouette is stable. Suboptimal study due to patient's large body habitus. No acute infiltrate or pleural effusion. No pulmonary edema.  IMPRESSION: No active disease.   Electronically Signed   By: Lahoma Crocker M.D.   On: 01/06/2014 13:19    Medications: I have reviewed the patient's current medications.  Assesment: Active Problems:   Chest pain obesity   Plan: Continue Telemetry Continue serial EKG and cardiac enzyme Cardiology consult.     LOS: 1 day   Mikea Quadros 01/07/2014, 7:03 AM

## 2014-01-07 NOTE — Progress Notes (Signed)
  Echocardiogram 2D Echocardiogram has been performed.  Roseanna Rainbow R 01/07/2014, 1:25 PM

## 2014-01-07 NOTE — Telephone Encounter (Signed)
Order placed

## 2014-01-07 NOTE — Discharge Summary (Signed)
Physician Discharge Summary  Patient ID: Caroline Velez MRN: [id number] DOB/AGE: November 30, 1955 58 y.o. Primary Care Physician:Dayannara Pascal, MD Admit date: 01/06/2014 Discharge date: 01/07/2014    Discharge Diagnoses:   Active Problems:   Chest pain obesity DM type II ( Newly diagnosed)    Medication List         ALEVE 220 MG tablet  Generic drug:  naproxen sodium  Take 220 mg by mouth 2 (two) times daily with a meal.     aspirin 81 MG EC tablet  Take 1 tablet (81 mg total) by mouth daily.     aspirin-acetaminophen-caffeine 106-269-48 MG per tablet  Commonly known as:  EXCEDRIN MIGRAINE  Take 1 tablet by mouth every 6 (six) hours as needed for pain.        Discharged Condition: stable    Consults: Cardiology  Significant Diagnostic Studies: Dg Chest Portable 1 View  01/06/2014   CLINICAL DATA:  Chest pain for 3 weeks  EXAM: PORTABLE CHEST - 1 VIEW  COMPARISON:  04/28/2013  FINDINGS: Cardiomediastinal silhouette is stable. Suboptimal study due to patient's large body habitus. No acute infiltrate or pleural effusion. No pulmonary edema.  IMPRESSION: No active disease.   Electronically Signed   By: Lahoma Crocker M.D.   On: 01/06/2014 13:19    Lab Results: Basic Metabolic Panel:  Recent Labs  01/06/14 1238 01/07/14 0601  NA 143 141  K 4.0 4.0  CL 105 104  CO2 26 26  GLUCOSE 96 89  BUN 16 16  CREATININE 0.81 0.84  CALCIUM 9.6 9.3   Liver Function Tests:  Recent Labs  01/07/14 0601  AST 23  ALT 23  ALKPHOS 103  BILITOT 0.3  PROT 7.5  ALBUMIN 3.5     CBC:  Recent Labs  01/06/14 1238  WBC 5.3  NEUTROABS 2.2  HGB 12.0  HCT 36.4  MCV 87.9  PLT 316    No results found for this or any previous visit (from the past 240 hour(s)).   Hospital Course:   This a 58 years old female with history obesity was admitted due chest pain. She has admitted under telemetry and had serial EKG and cardiac enzyme which was normal. Patient was seen by  cardiology and advised to schedule for out patient stress test. Patient is also found to have a HGA1C of 6.7. Patient will followed in the office in one week and will try life style modification.  Discharge Exam: Blood pressure 131/80, pulse 65, temperature 98.6 F (37 C), temperature source Oral, resp. rate 20, height 5\' 2"  (1.575 m), weight 115.44 kg (254 lb 8 oz), SpO2 100.00%.   Disposition:  home        Follow-up Information   Follow up with [facility] - Radiology. (Register at Radiology Dept at 8 am for stress test. Do not eat or drink after midnight.)    Contact information:   [phone]      Follow up with Jory Sims, NP On 01/21/2014. (at 1:30 pm)    Specialty:  Nurse Practitioner   Contact information:   [place] [address] [phone]       Follow up with [facility], MD.   Specialty:  Internal Medicine   Contact information:   [place] [address] [phone]       Signed: Kayden Amend   01/07/2014, 5:30 PM

## 2014-01-07 NOTE — Progress Notes (Signed)
Echo overall normal, EKGs and cardiac enzymes without evidence of ACS. Due to cardiac risk factors (DM, HL, family history) she does warrant a stress test, on an outpatient basis is fine. We will arrange outpatient GXT. From inpatient standpoint ok to discharge, would continue ASA. She will need management of her newly diagnosed DM by her pcp, and also an updated lipid profile as outpatient as she will need a statin likely.  Caroline Abts MD

## 2014-01-07 NOTE — Progress Notes (Signed)
UR completed 

## 2014-01-12 ENCOUNTER — Ambulatory Visit (HOSPITAL_COMMUNITY): Admit: 2014-01-12 | Payer: BC Managed Care – PPO

## 2014-01-19 ENCOUNTER — Encounter: Payer: Self-pay | Admitting: Cardiology

## 2014-01-19 ENCOUNTER — Encounter (HOSPITAL_COMMUNITY): Payer: Self-pay

## 2014-01-19 ENCOUNTER — Ambulatory Visit (HOSPITAL_COMMUNITY)
Admission: RE | Admit: 2014-01-19 | Discharge: 2014-01-19 | Disposition: A | Discharge status: Home / Self Care | Payer: BC Managed Care – PPO | Source: Ambulatory Visit | Attending: Cardiology | Admitting: Cardiology

## 2014-01-19 ENCOUNTER — Telehealth: Payer: Self-pay | Admitting: *Deleted

## 2014-01-19 DIAGNOSIS — R079 Chest pain, unspecified: Secondary | ICD-10-CM | POA: Insufficient documentation

## 2014-01-19 NOTE — Telephone Encounter (Signed)
Pt made aware of stress test results. Confirmed appt for Friday with Jory Sims, NP

## 2014-01-19 NOTE — Progress Notes (Signed)
Stress Lab Nurses Notes - [facility]  CAYLEEN BENJAMIN 01/19/2014 Reason for doing test: Chest Pain Type of test: Regular GTX Nurse performing test: Gerrit Halls, RN Nuclear Medicine Tech: Not Applicable Echo Tech: Not Applicable MD performing test: S. McDowell/K.Purcell Nails NP Family MD: Legrand Rams Test explained and consent signed: Yes.   IV started: No IV started Symptoms: SOB Treatment/Intervention: None Reason test stopped: fatigue After recovery IV was: NA Patient to return to Nuc. Med at : NA Patient discharged: Home Patient's Condition upon discharge was: stable Comments: During test peak BP 190/70 & HR 150.  Recovery BP 127/73 & HR  86.  Symptoms resolved in recovery. [name]

## 2014-01-19 NOTE — Progress Notes (Signed)
Stress test overall looks good, no evidence of any blockages. Will discuss in further detail at follow up this week.   Zandra Abts MD

## 2014-01-19 NOTE — Progress Notes (Signed)
Stress Lab Nurses Notes - [facility]  Caroline Velez 01/19/2014 Reason for doing test: Chest Pain Type of test: Regular GTX Nurse performing test: Gerrit Halls, RN Nuclear Medicine Tech: Not Applicable Echo Tech: Not Applicable MD performing test: S. McDowell/K.Purcell Nails NP Family MD: Legrand Rams Test explained and consent signed: Yes.  IV started: No IV started Symptoms: SOB Treatment/Intervention: None Reason test stopped: Fatigue After recovery IV was: NA Patient to return to Nuc. Med at : NA Patient discharged: Home Patient's Condition upon discharge was: Stable Comments: During test peak BP 190/70 & HR 150. Recovery BP 127/73 & HR 86. Symptoms resolved in recovery. [name]  Attending note:  Patient exercised on Bruce protocol for 4:18 reaching peak heart rate 150 BPM, 92% MPHR. Peak blood pressure 190/70. No chest pain was reported. Limited exercise capacity. Occasional PACs and PVCs noted. There were no diagnostic ST segment abnormalities by standard criteria. Hypertensive response. Duke treadmill score 4 indicates moderate risk, based mainly on exercise capacity.  Satira Sark, M.D., F.A.C.C.

## 2014-01-21 ENCOUNTER — Ambulatory Visit (INDEPENDENT_AMBULATORY_CARE_PROVIDER_SITE_OTHER): Payer: BC Managed Care – PPO | Admitting: Adult Health

## 2014-01-21 ENCOUNTER — Encounter: Payer: Self-pay | Admitting: Adult Health

## 2014-01-21 VITALS — BP 116/74 | HR 77 | Ht 62.0 in | Wt 255.0 lb

## 2014-01-21 DIAGNOSIS — I10 Essential (primary) hypertension: Secondary | ICD-10-CM | POA: Insufficient documentation

## 2014-01-21 DIAGNOSIS — R079 Chest pain, unspecified: Secondary | ICD-10-CM

## 2014-01-21 NOTE — Assessment & Plan Note (Signed)
She is without complaint of chest pain now. Only intermittent usually when lying down. I have suggested H2 blocker Pepcid or Zantac prn. She may be having some gastritis with use of NSAIDS.

## 2014-01-21 NOTE — Assessment & Plan Note (Signed)
She is not on any hypertensive medications at this time. She did have hypertensive response to exercise 190/90. Today she is normotensive. She takes NSAIDs during the time her arthritis flares. I will given her a BP machine provided by [facility] drug rep with BP record sheet. She is to take her BP BID and record. She is to call us for BP > 160/90.  Will consider HCTZ. She may need to consider alternative to Alleve if BP remains elevated with its use. See her again in 3 months.

## 2014-01-21 NOTE — Patient Instructions (Signed)
Your physician wants you to follow-up in: 3 months      Take Zantac 150 mg daily which is over the counter, OR Pepcid 20 mg daily,whichever is cheaper    Keep BP log and call us as directed by Ms.Lawrence NP       Thank you for choosing [facility] !

## 2014-01-21 NOTE — Progress Notes (Deleted)
Name: Caroline Velez    DOB: [birth year]  Age: 58 y.o.  MR#: [id number]       PCP:  Rosita Fire, MD      Insurance: Payor: BLUE Blue / Plan: [place] PPO / Product Type: *No Product type* /   CC:    Chief Complaint  Patient presents with  . Chest Pain    VS Filed Vitals:   01/21/14 1315  BP: 116/74  Pulse: 77  Height: 5\' 2"  (1.575 m)  Weight: 255 lb (115.667 kg)    Weights Current Weight  01/21/14 255 lb (115.667 kg)  01/07/14 254 lb 8 oz (115.44 kg)  01/25/13 247 lb (112.038 kg)    Blood Pressure  BP Readings from Last 3 Encounters:  01/21/14 116/74  01/07/14 131/80  01/25/13 128/84     Admit date:  (Not on file) Last encounter with RMR:  Visit date not found   Allergy Review of patient's allergies indicates no known allergies.  Current Outpatient Prescriptions  Medication Sig Dispense Refill  . aspirin EC 81 MG EC tablet Take 1 tablet (81 mg total) by mouth daily. 30 tablet 3  . aspirin-acetaminophen-caffeine (EXCEDRIN MIGRAINE) [phone] MG per tablet Take 1 tablet by mouth every 6 (six) hours as needed for pain.    Marland Kitchen CRESTOR 10 MG tablet Take 10 mg by mouth daily.      No current facility-administered medications for this visit.    Discontinued Meds:    Medications Discontinued During This Encounter  Medication Reason  . naproxen sodium (ALEVE) 220 MG tablet Error    Patient Active Problem List   Diagnosis Date Noted  . Chest pain 01/06/2014  . Healthcare maintenance 01/25/2013  . 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 Headache 01/10/2013  . Mental status change 01/10/2013  . Bacterial meningitis 01/10/2013  . Fever 01/10/2013  . Sepsis 01/10/2013  . Low back pain radiating to left leg 07/26/2010    LABS    Component Value Date/Time   NA 141 01/07/2014 0601   NA 143 01/06/2014 1238   NA 139 01/14/2013 0530   K 4.0 01/07/2014 0601   K 4.0 01/06/2014 1238   K 3.7 01/14/2013 0530   CL 104 01/07/2014 0601   CL 105  01/06/2014 1238   CL 104 01/14/2013 0530   CO2 26 01/07/2014 0601   CO2 26 01/06/2014 1238   CO2 26 01/14/2013 0530   GLUCOSE 89 01/07/2014 0601   GLUCOSE 96 01/06/2014 1238   GLUCOSE 115* 01/14/2013 0530   BUN 16 01/07/2014 0601   BUN 16 01/06/2014 1238   BUN 14 01/14/2013 0530   CREATININE 0.84 01/07/2014 0601   CREATININE 0.81 01/06/2014 1238   CREATININE 0.81 01/14/2013 0530   CALCIUM 9.3 01/07/2014 0601   CALCIUM 9.6 01/06/2014 1238   CALCIUM 8.9 01/14/2013 0530   GFRNONAA 76* 01/07/2014 0601   GFRNONAA 79* 01/06/2014 1238   GFRNONAA 80* 01/14/2013 0530   GFRAA 88* 01/07/2014 0601   GFRAA >90 01/06/2014 1238   GFRAA >90 01/14/2013 0530   CMP     Component Value Date/Time   NA 141 01/07/2014 0601   K 4.0 01/07/2014 0601   CL 104 01/07/2014 0601   CO2 26 01/07/2014 0601   GLUCOSE 89 01/07/2014 0601   BUN 16 01/07/2014 0601   CREATININE 0.84 01/07/2014 0601   CALCIUM 9.3 01/07/2014 0601   PROT 7.5 01/07/2014 0601   ALBUMIN 3.5 01/07/2014 0601   AST 23 01/07/2014 0601  ALT 23 01/07/2014 0601   ALKPHOS 103 01/07/2014 0601   BILITOT 0.3 01/07/2014 0601   GFRNONAA 76* 01/07/2014 0601   GFRAA 88* 01/07/2014 0601       Component Value Date/Time   WBC 5.3 01/06/2014 1238   WBC 7.5 01/13/2013 0420   WBC 11.3* 01/12/2013 0411   HGB 12.0 01/06/2014 1238   HGB 11.3* 01/13/2013 0420   HGB 11.5* 01/12/2013 0411   HCT 36.4 01/06/2014 1238   HCT 34.3* 01/13/2013 0420   HCT 34.7* 01/12/2013 0411   MCV 87.9 01/06/2014 1238   MCV 89.1 01/13/2013 0420   MCV 88.3 01/12/2013 0411    Lipid Panel  No results found for: CHOL, TRIG, HDL, CHOLHDL, VLDL, LDLCALC, LDLDIRECT  ABG    Component Value Date/Time   PHART 7.437 01/10/2013 1530   PCO2ART 34.5* 01/10/2013 1530   PO2ART 119.0* 01/10/2013 1530   HCO3 22.9 01/10/2013 1530   TCO2 20.2 01/10/2013 1530   ACIDBASEDEF 0.8 01/10/2013 1530   O2SAT 98.4 01/10/2013 1530     Lab Results  Component Value Date   TSH 0.646  01/06/2014   BNP (last 3 results) No results for input(s): PROBNP in the last 8760 hours. Cardiac Panel (last 3 results) No results for input(s): CKTOTAL, CKMB, TROPONINI, RELINDX in the last 72 hours.  Iron/TIBC/Ferritin/ %Sat No results found for: IRON, TIBC, FERRITIN, IRONPCTSAT   EKG Orders placed or performed during the hospital encounter of 01/06/14  . EKG 12-Lead  . EKG 12-Lead  . ED EKG  . ED EKG  . EKG 12-Lead  . EKG 12-Lead  . EKG     Prior Assessment and Plan Problem List as of 01/21/2014      Nervous and Auditory   Bacterial meningitis   Last Assessment & Plan   01/25/2013 Office Visit Written 01/25/2013  9:42 AM by Campbell Riches, MD    She's doing well. Will pull her PIC line. Consider her treated. She can f/u with PCP. rec for her to get flu shot, she refuses. She had 2 HIV Ab (-) in hospital.     Haemophilus meningitis     Other   Healthcare maintenance   Last Assessment & Plan   01/25/2013 Office Visit Written 01/25/2013  9:43 AM by Campbell Riches, MD    Will set her up for mammogram, has been > 2 years, +Fhx.     Low back pain radiating to left leg   Headache   Mental status change   Fever   Sepsis   Chest pain       Imaging: Dg Chest Portable 1 View  01/06/2014   CLINICAL DATA:  Chest pain for 3 weeks  EXAM: PORTABLE CHEST - 1 VIEW  COMPARISON:  04/28/2013  FINDINGS: Cardiomediastinal silhouette is stable. Suboptimal study due to patient's large body habitus. No acute infiltrate or pleural effusion. No pulmonary edema.  IMPRESSION: No active disease.   Electronically Signed   By: Lahoma Crocker M.D.   On: 01/06/2014 13:19

## 2014-01-21 NOTE — Progress Notes (Signed)
    HPI: Caroline Velez is a 58 y/o patient of Dr. Harl Bowie we are following post hospitalization due to  admission for chest pain. She was ruled out for myocardial infarction and planned for an outpatient stress test. Stress test was completed on 01/19/2014, negative for ischemia, but was found to have hypertensive response. She is here for followup to discuss results and symptoms.  She is doing well and without complaints today, but has some intermittent chest pressure. She continues to take Alleve for arthritis pain and sometimes notices her BP is elevated with this.    No Known Allergies  Current Outpatient Prescriptions  Medication Sig Dispense Refill  . aspirin EC 81 MG EC tablet Take 1 tablet (81 mg total) by mouth daily. 30 tablet 3  . aspirin-acetaminophen-caffeine (EXCEDRIN MIGRAINE) [phone] MG per tablet Take 1 tablet by mouth every 6 (six) hours as needed for pain.    Marland Kitchen CRESTOR 10 MG tablet Take 10 mg by mouth daily.      No current facility-administered medications for this visit.    Past Medical History  Diagnosis Date  . Allergy   . 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 Breast surgery      bilateral    ROS: Review of systems complete and found to be negative unless listed above  PHYSICAL EXAM BP 116/74 mmHg  Pulse 77  Ht 5\' 2"  (1.575 m)  Wt 255 lb (115.667 kg)  BMI 46.63 kg/m2  General: Well developed, well nourished, in no acute distress Head: Eyes PERRLA, No xanthomas.   Normal cephalic and atramatic  Lungs: Clear bilaterally to auscultation and percussion. Heart: HRRR S1 S2, without MRG.  Pulses are 2+ & equal.            No carotid bruit. No JVD.  No abdominal bruits. No femoral bruits. Abdomen: Bowel sounds are positive, abdomen soft and non-tender without masses or                  Hernia's noted. Msk:  Back normal, normal gait. Normal strength and tone for age. Extremities: No clubbing,  cyanosis or edema.  DP +1 Neuro: Alert and oriented X 3. Psych:  Good affect, responds appropriately    ASSESSMENT AND PLAN

## 2014-04-22 ENCOUNTER — Other Ambulatory Visit (HOSPITAL_COMMUNITY): Payer: Self-pay | Admitting: Internal Medicine

## 2014-04-22 DIAGNOSIS — Z1231 Encounter for screening mammogram for malignant neoplasm of breast: Secondary | ICD-10-CM

## 2014-04-27 ENCOUNTER — Ambulatory Visit (HOSPITAL_COMMUNITY): Payer: BC Managed Care – PPO

## 2014-05-11 ENCOUNTER — Ambulatory Visit (HOSPITAL_COMMUNITY): Payer: BC Managed Care – PPO

## 2014-05-16 ENCOUNTER — Ambulatory Visit (HOSPITAL_COMMUNITY)
Admission: RE | Admit: 2014-05-16 | Discharge: 2014-05-16 | Disposition: A | Discharge status: Home / Self Care | Payer: BC Managed Care – PPO | Source: Ambulatory Visit | Attending: Internal Medicine | Admitting: Internal Medicine

## 2014-05-16 DIAGNOSIS — Z1231 Encounter for screening mammogram for malignant neoplasm of breast: Secondary | ICD-10-CM | POA: Insufficient documentation

## 2015-04-13 ENCOUNTER — Other Ambulatory Visit (HOSPITAL_COMMUNITY): Payer: Self-pay | Admitting: Internal Medicine

## 2015-04-13 DIAGNOSIS — Z1231 Encounter for screening mammogram for malignant neoplasm of breast: Secondary | ICD-10-CM

## 2015-05-17 ENCOUNTER — Ambulatory Visit (HOSPITAL_COMMUNITY): Payer: Self-pay

## 2015-06-01 ENCOUNTER — Ambulatory Visit (HOSPITAL_COMMUNITY)
Admission: RE | Admit: 2015-06-01 | Discharge: 2015-06-01 | Disposition: A | Discharge status: Home / Self Care | Payer: BC Managed Care – PPO | Source: Ambulatory Visit | Attending: Internal Medicine | Admitting: Internal Medicine

## 2015-06-01 DIAGNOSIS — Z1231 Encounter for screening mammogram for malignant neoplasm of breast: Secondary | ICD-10-CM | POA: Insufficient documentation

## 2017-01-22 ENCOUNTER — Encounter: Payer: Self-pay | Admitting: Family Medicine

## 2017-01-22 ENCOUNTER — Ambulatory Visit: Payer: BC Managed Care – PPO | Admitting: Family Medicine

## 2017-01-22 VITALS — BP 120/70 | HR 77 | Temp 98.1°F | Ht 61.5 in | Wt 235.7 lb

## 2017-01-22 DIAGNOSIS — J3489 Other specified disorders of nose and nasal sinuses: Secondary | ICD-10-CM

## 2017-01-22 DIAGNOSIS — G629 Polyneuropathy, unspecified: Secondary | ICD-10-CM

## 2017-01-22 DIAGNOSIS — Z7689 Persons encountering health services in other specified circumstances: Secondary | ICD-10-CM

## 2017-01-22 DIAGNOSIS — D171 Benign lipomatous neoplasm of skin and subcutaneous tissue of trunk: Secondary | ICD-10-CM

## 2017-01-22 DIAGNOSIS — E785 Hyperlipidemia, unspecified: Secondary | ICD-10-CM

## 2017-01-22 DIAGNOSIS — E119 Type 2 diabetes mellitus without complications: Secondary | ICD-10-CM | POA: Diagnosis not present

## 2017-01-22 DIAGNOSIS — J302 Other seasonal allergic rhinitis: Secondary | ICD-10-CM | POA: Diagnosis not present

## 2017-01-22 LAB — CBC WITH DIFFERENTIAL/PLATELET
BASOS ABS: 0.1 10*3/uL (ref 0.0–0.1)
Basophils Relative: 1 % (ref 0.0–3.0)
EOS ABS: 0.2 10*3/uL (ref 0.0–0.7)
Eosinophils Relative: 3.7 % (ref 0.0–5.0)
HEMATOCRIT: 37.8 % (ref 36.0–46.0)
Hemoglobin: 12.2 g/dL (ref 12.0–15.0)
Lymphocytes Relative: 44.5 % (ref 12.0–46.0)
Lymphs Abs: 2.6 10*3/uL (ref 0.7–4.0)
MCHC: 32.4 g/dL (ref 30.0–36.0)
MCV: 89.4 fl (ref 78.0–100.0)
Monocytes Absolute: 0.5 10*3/uL (ref 0.1–1.0)
Monocytes Relative: 8.1 % (ref 3.0–12.0)
Neutro Abs: 2.5 10*3/uL (ref 1.4–7.7)
Neutrophils Relative %: 42.7 % — ABNORMAL LOW (ref 43.0–77.0)
Platelets: 327 10*3/uL (ref 150.0–400.0)
RBC: 4.22 Mil/uL (ref 3.87–5.11)
RDW: 14.4 % (ref 11.5–15.5)
WBC: 5.7 10*3/uL (ref 4.0–10.5)

## 2017-01-22 LAB — COMPREHENSIVE METABOLIC PANEL
ALBUMIN: 4 g/dL (ref 3.5–5.2)
ALT: 14 U/L (ref 0–35)
AST: 16 U/L (ref 0–37)
Alkaline Phosphatase: 114 U/L (ref 39–117)
BUN: 13 mg/dL (ref 6–23)
CALCIUM: 9.7 mg/dL (ref 8.4–10.5)
CO2: 30 mEq/L (ref 19–32)
CREATININE: 0.88 mg/dL (ref 0.40–1.20)
Chloride: 104 mEq/L (ref 96–112)
GFR: 84.09 mL/min (ref >60.00)
Glucose, Bld: 106 mg/dL — ABNORMAL HIGH (ref 70–99)
Potassium: 4.6 mEq/L (ref 3.5–5.1)
Sodium: 141 mEq/L (ref 135–145)
Total Bilirubin: 0.3 mg/dL (ref 0.2–1.2)
Total Protein: 7.4 g/dL (ref 6.0–8.3)

## 2017-01-22 LAB — LIPID PANEL
Cholesterol: 265 mg/dL — ABNORMAL HIGH (ref 0–200)
HDL: 41 mg/dL (ref >39.00)
LDL Cholesterol: 201 mg/dL — ABNORMAL HIGH (ref 0–99)
NonHDL: 223.7
TRIGLYCERIDES: 114 mg/dL (ref 0.0–149.0)
Total CHOL/HDL Ratio: 6
VLDL: 22.8 mg/dL (ref 0.0–40.0)

## 2017-01-22 LAB — HEMOGLOBIN A1C: Hgb A1c MFr Bld: 6.4 % (ref 4.6–6.5)

## 2017-01-22 MED ORDER — GABAPENTIN 100 MG PO CAPS: 100.0000 mg | ORAL_CAPSULE | Freq: Three times a day (TID) | ORAL | 0 refills | Status: DC

## 2017-01-22 MED ORDER — METFORMIN HCL 500 MG PO TABS: 500.0000 mg | ORAL_TABLET | Freq: Two times a day (BID) | ORAL | 3 refills | Status: DC

## 2017-01-22 NOTE — Progress Notes (Signed)
Patient presents to clinic today to establish care.  SUBJECTIVE: PMH: Pt is a 61 yo female with pmh sig for DM II, HLD, and allergies.  Pt was formerly seen by Dr. Rosita Fire in [place].  DM: -Pt has been on Metformin 500mg  BID ~1 yr or so -Pt was checking fsbs at home, but stopped. -States her bs was 109 so she didn't think she needed to keep checking it. -Pt had vision checked a few months ago.  Got new rx for glasses  HLD: -was taking crestor 10 mg -states it made her feel lightheaded and dizzy so she was told to take it qod -pt endorses inconsistent medication compliance.  Not currently taking -states is eating "the wrong things" but does try to bake foods and does not cook with salt.  May have 1 meal per day (lunch) and popcorn. -drinking 3 bottles of water per day.  Sinus congestion: -states having occasional coughing and sinus issues -some difficulty sleeping -taking OTC meds -endorses improvement -denies fever, chills, sore throat  Seasonal allergies: -notes problems with change in season -taking generic Cetirizine  Left thigh discomfort: -Patient endorses burning/numbness, tingling in left thigh for a while -Patient has a history of low back pain with radiation to left leg -Patient denies peripheral neuropathy. -Patient has not taken anything for this problem  Past surgical history: Partial hysterectomy 1987 secondary to heavy bleeding Breast biopsy x2  Allergies: NKDA  Social history: Patient is single.  She has 1 daughter.  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  She now works part-time at [employer].  Patient denies tobacco, alcohol, drug use.  Family medical history: Mom-deceased, breast cancer with metastases to bone Dad-alive, HTN Sister, Tina-alive Sister, Debra-alive Sister, Diane-alive Daughter, Andrea-alive  Health Maintenance: Dental --Dr. Sherrian Divers Vision --Dr. Saintclair Halsted (my  eye Dr. Cecile Hearing [place]) Immunizations --patient does not like needles.  She declines flu shot this visit.  Patient also states she is not interested in shingles or pneumococcal vaccines. Colonoscopy --2016, follow-up in 10 years Mammogram --several years ago    Past Medical History:  Diagnosis Date  . Allergy   . Diabetes mellitus without complication ([place])   . Hyperlipidemia   . 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 BREAST SURGERY     bilateral  . PARTIAL HYSTERECTOMY      Current Outpatient Medications on File Prior to Visit  Medication Sig Dispense Refill  . aspirin EC 81 MG EC tablet Take 1 tablet (81 mg total) by mouth daily. 30 tablet 3  . aspirin-acetaminophen-caffeine (EXCEDRIN MIGRAINE) [phone] MG per tablet Take 1 tablet by mouth every 6 (six) hours as needed for pain.    Marland Kitchen CRESTOR 10 MG tablet Take 10 mg by mouth daily.      No current facility-administered medications on file prior to visit.     No Known Allergies  Family History  Problem Relation Age of Onset  . Cancer Mother        breast  . Hypertension Father     Social History   Socioeconomic History  . Marital status: Divorced    Spouse name: Not on file  . Number of children: Not on file  . Years of education: Not on file  . Highest education level: Not on file  Social Needs  . Financial resource strain: Not on file  .  Food insecurity - worry: Not on file  . Food insecurity - inability: Not on file  . Transportation needs - medical: Not on file  . Transportation needs - non-medical: Not on file  Occupational History  . Not on file  Tobacco Use  . Smoking status: Never Smoker  . Smokeless tobacco: Never Used  Substance and Sexual Activity  . Alcohol use: No  . Drug use: No  . Sexual activity: Not on file  Other Topics Concern  . Not on file  Social History Narrative  . Not on file    ROS General: Denies  fever, chills, night sweats, changes in weight, changes in appetite HEENT: Denies headaches, ear pain, changes in vision, rhinorrhea, sore throat CV: Denies CP, palpitations, SOB, orthopnea Pulm: Denies SOB, cough, wheezing GI: Denies abdominal pain, nausea, vomiting, diarrhea, constipation GU: Denies dysuria, hematuria, frequency, vaginal discharge Msk: Denies muscle cramps, joint pains Neuro: Denies weakness, numbness, tingling Skin: Denies rashes, bruising Psych: Denies depression, anxiety, hallucinations  BP 120/70 (BP Location: Right Arm, Patient Position: Sitting, Cuff Size: Large)   Pulse 77   Temp 98.1 F (36.7 C) (Oral)   Ht 5' 1.5" (1.562 m)   Wt 235 lb 11.2 oz (106.9 kg)   BMI 43.81 kg/m   Physical Exam Gen. Pleasant, well developed, well-nourished, in NAD HEENT - [place]/AT, PERRL, no scleral icterus, no nasal drainage, pharynx without erythema or exudate. Neck: No JVD, no thyromegaly, no carotid bruits Lungs: no use of accessory muscles, CTAB, no wheezes, rales or rhonchi Cardiovascular: RRR, No r/g/m, no peripheral edema Abdomen: BS present, soft, nontender,nondistended Musculoskeletal: No deformities, moves all four extremities, no cyanosis or clubbing, normal tone.  Baseball sized lipoma 8cm x 6cm on right mid back just above the bra line. Neuro:  A&Ox3, CN II-XII intact, normal gait Skin:  Warm, dry, intact, no lesions Psych: normal affect, mood appropriate  No results found for this or any previous visit (from the past 2160 hour(s)).  Assessment/Plan: Controlled type 2 diabetes mellitus without complication, without long-term current use of insulin (HCC)  -Lifestyle modifications encouraged -increased physical activity encouraged -Will obtain labs -Continue Metformin 500 mg BID. If needed will increase or use additional medication. - Plan: Comprehensive metabolic panel, CBC with Differential/Platelet, Hemoglobin A1c, metFORMIN (GLUCOPHAGE) 500 MG  tablet  Hyperlipidemia, unspecified hyperlipidemia type  -Discussed different medication options instead of Crestor. -Lifestyle modifications encouraged -will obtain lipid panel and discuss need for Statin upon receiving  Results -Recommended pt re-start taking ASA 81 mg daily - Plan: Lipid panel  Seasonal allergies -continue OTC cetirizine -Pt encouraged to contact office if still having issues  Sinus drainage -Improving -Continue OTC meds -RTC precautions given.  Patient to inform clinic if becoming worse.  Encounter to establish care -We reviewed the PMH, PSH, FH, SH, Meds and Allergies. -We provided refills for any medications we will prescribe as needed. -We addressed current concerns per orders and patient instructions. -We have asked for records for pertinent exams, studies, vaccines and notes from previous providers. -We have advised patient to follow up per instructions below.  Neuropathy  -Left thigh -Plan: gabapentin (NEURONTIN) 100 MG capsule -if continues consider further testing such as EMG.  Lipoma of back  -Given size of lipoma will refer out for removal - Plan: Ambulatory referral to Dermatology   Follow-up in 2-3 months for DM

## 2017-01-22 NOTE — Patient Instructions (Addendum)
Cholesterol Cholesterol is a fat. Your body needs a small amount of cholesterol. Cholesterol (plaque) may build up in your blood vessels (arteries). That makes you more likely to have a heart attack or stroke. You cannot feel your cholesterol level. Having a blood test is the only way to find out if your level is high. Keep your test results. Work with your doctor to keep your cholesterol at a good level. What do the results mean?  Total cholesterol is how much cholesterol is in your blood.  LDL is bad cholesterol. This is the type that can build up. Try to have low LDL.  HDL is good cholesterol. It cleans your blood vessels and carries LDL away. Try to have high HDL.  Triglycerides are fat that the body can store or burn for energy. What are good levels of cholesterol?  Total cholesterol below 200.  LDL below 100 is good for people who have health risks. LDL below 70 is good for people who have very high risks.  HDL above 40 is good. It is best to have HDL of 60 or higher.  Triglycerides below 150. How can I lower my cholesterol? Diet Follow your diet program as told by your doctor.  Choose fish, white meat chicken, or [place] that is roasted or baked. Try not to eat red meat, fried foods, sausage, or lunch meats.  Eat lots of fresh fruits and vegetables.  Choose whole grains, beans, pasta, potatoes, and cereals.  Choose olive oil, corn oil, or canola oil. Only use small amounts.  Try not to eat butter, mayonnaise, shortening, or palm kernel oils.  Try not to eat foods with trans fats.  Choose low-fat or nonfat dairy foods. ? Drink skim or nonfat milk. ? Eat low-fat or nonfat yogurt and cheeses. ? Try not to drink whole milk or cream. ? Try not to eat ice cream, egg yolks, or full-fat cheeses.  Healthy desserts include angel food cake, ginger snaps, animal crackers, hard candy, popsicles, and low-fat or nonfat frozen yogurt. Try not to eat pastries, cakes, pies, and  cookies.  Exercise Follow your exercise program as told by your doctor.  Be more active. Try gardening, walking, and taking the stairs.  Ask your doctor about ways that you can be more active.  Medicine  Take over-the-counter and prescription medicines only as told by your doctor.  This information is not intended to replace advice given to you by your health care provider. Make sure you discuss any questions you have with your health care provider. Document Released: 05/24/2008 Document Revised: 09/27/2015 Document Reviewed: 09/07/2015 Elsevier Interactive Patient Education  2017 [place] Maintenance, Female Adopting a healthy lifestyle and getting preventive care can go a long way to promote health and wellness. Talk with your health care provider about what schedule of regular examinations is right for you. This is a good chance for you to check in with your provider about disease prevention and staying healthy. In between checkups, there are plenty of things you can do on your own. Experts have done a lot of research about which lifestyle changes and preventive measures are most likely to keep you healthy. Ask your health care provider for more information. Weight and diet Eat a healthy diet  Be sure to include plenty of vegetables, fruits, low-fat dairy products, and lean protein.  Do not eat a lot of foods high in solid fats, added sugars, or salt.  Get regular exercise. This is one of the most  important things you can do for your health. ? Most adults should exercise for at least 150 minutes each week. The exercise should increase your heart rate and make you sweat (moderate-intensity exercise). ? Most adults should also do strengthening exercises at least twice a week. This is in addition to the moderate-intensity exercise.  Maintain a healthy weight  Body mass index (BMI) is a measurement that can be used to identify possible weight problems. It estimates body  fat based on height and weight. Your health care provider can help determine your BMI and help you achieve or maintain a healthy weight.  For females 76 years of age and older: ? A BMI below 18.5 is considered underweight. ? A BMI of 18.5 to 24.9 is normal. ? A BMI of 25 to 29.9 is considered overweight. ? A BMI of 30 and above is considered obese.  Watch levels of cholesterol and blood lipids  You should start having your blood tested for lipids and cholesterol at 60 years of age, then have this test every 5 years.  You may need to have your cholesterol levels checked more often if: ? Your lipid or cholesterol levels are high. ? You are older than 60 years of age. ? You are at high risk for heart disease.  Cancer screening Lung Cancer  Lung cancer screening is recommended for adults 43-68 years old who are at high risk for lung cancer because of a history of smoking.  A yearly low-dose CT scan of the lungs is recommended for people who: ? Currently smoke. ? Have quit within the past 15 years. ? Have at least a 30-pack-year history of smoking. A pack year is smoking an average of one pack of cigarettes a day for 1 year.  Yearly screening should continue until it has been 15 years since you quit.  Yearly screening should stop if you develop a health problem that would prevent you from having lung cancer treatment.  Breast Cancer  Practice breast self-awareness. This means understanding how your breasts normally appear and feel.  It also means doing regular breast self-exams. Let your health care provider know about any changes, no matter how small.  If you are in your 20s or 30s, you should have a clinical breast exam (CBE) by a health care provider every 1-3 years as part of a regular health exam.  If you are 27 or older, have a CBE every year. Also consider having a breast X-ray (mammogram) every year.  If you have a family history of breast cancer, talk to your health care  provider about genetic screening.  If you are at high risk for breast cancer, talk to your health care provider about having an MRI and a mammogram every year.  Breast cancer gene (BRCA) assessment is recommended for women who have family members with BRCA-related cancers. BRCA-related cancers include: ? Breast. ? Ovarian. ? Tubal. ? Peritoneal cancers.  Results of the assessment will determine the need for genetic counseling and BRCA1 and BRCA2 testing.  Cervical Cancer Your health care provider may recommend that you be screened regularly for cancer of the pelvic organs (ovaries, uterus, and vagina). This screening involves a pelvic examination, including checking for microscopic changes to the surface of your cervix (Pap test). You may be encouraged to have this screening done every 3 years, beginning at age 1.  For women ages 36-65, health care providers may recommend pelvic exams and Pap testing every 3 years, or they may recommend the  Pap and pelvic exam, combined with testing for human papilloma virus (HPV), every 5 years. Some types of HPV increase your risk of cervical cancer. Testing for HPV may also be done on women of any age with unclear Pap test results.  Other health care providers may not recommend any screening for nonpregnant women who are considered low risk for pelvic cancer and who do not have symptoms. Ask your health care provider if a screening pelvic exam is right for you.  If you have had past treatment for cervical cancer or a condition that could lead to cancer, you need Pap tests and screening for cancer for at least 20 years after your treatment. If Pap tests have been discontinued, your risk factors (such as having a new sexual partner) need to be reassessed to determine if screening should resume. Some women have medical problems that increase the chance of getting cervical cancer. In these cases, your health care provider may recommend more frequent screening and  Pap tests.  Colorectal Cancer  This type of cancer can be detected and often prevented.  Routine colorectal cancer screening usually begins at 60 years of age and continues through 60 years of age.  Your health care provider may recommend screening at an earlier age if you have risk factors for colon cancer.  Your health care provider may also recommend using home test kits to check for hidden blood in the stool.  A small camera at the end of a tube can be used to examine your colon directly (sigmoidoscopy or colonoscopy). This is done to check for the earliest forms of colorectal cancer.  Routine screening usually begins at age 49.  Direct examination of the colon should be repeated every 5-10 years through 60 years of age. However, you may need to be screened more often if early forms of precancerous polyps or small growths are found.  Skin Cancer  Check your skin from head to toe regularly.  Tell your health care provider about any new moles or changes in moles, especially if there is a change in a mole's shape or color.  Also tell your health care provider if you have a mole that is larger than the size of a pencil eraser.  Always use sunscreen. Apply sunscreen liberally and repeatedly throughout the day.  Protect yourself by wearing long sleeves, pants, a wide-brimmed hat, and sunglasses whenever you are outside.  Heart disease, diabetes, and high blood pressure  High blood pressure causes heart disease and increases the risk of stroke. High blood pressure is more likely to develop in: ? People who have blood pressure in the high end of the normal range (130-139/85-89 mm Hg). ? People who are overweight or obese. ? People who are African American.  If you are 61-67 years of age, have your blood pressure checked every 3-5 years. If you are 22 years of age or older, have your blood pressure checked every year. You should have your blood pressure measured twice-once when you  are at a hospital or clinic, and once when you are not at a hospital or clinic. Record the average of the two measurements. To check your blood pressure when you are not at a hospital or clinic, you can use: ? An automated blood pressure machine at a pharmacy. ? A home blood pressure monitor.  If you are between 55 years and 29 years old, ask your health care provider if you should take aspirin to prevent strokes.  Have regular diabetes screenings.  This involves taking a blood sample to check your fasting blood sugar level. ? If you are at a normal weight and have a low risk for diabetes, have this test once every three years after 60 years of age. ? If you are overweight and have a high risk for diabetes, consider being tested at a younger age or more often. Preventing infection Hepatitis B  If you have a higher risk for hepatitis B, you should be screened for this virus. You are considered at high risk for hepatitis B if: ? You were born in a country where hepatitis B is common. Ask your health care provider which countries are considered high risk. ? Your parents were born in a high-risk country, and you have not been immunized against hepatitis B (hepatitis B vaccine). ? You have HIV or AIDS. ? You use needles to inject street drugs. ? You live with someone who has hepatitis B. ? You have had sex with someone who has hepatitis B. ? You get hemodialysis treatment. ? You take certain medicines for conditions, including cancer, organ transplantation, and autoimmune conditions.  Hepatitis C  Blood testing is recommended for: ? Everyone born from [birth year] through 1964. ? Anyone with known risk factors for hepatitis C.  Sexually transmitted infections (STIs)  You should be screened for sexually transmitted infections (STIs) including gonorrhea and chlamydia if: ? You are sexually active and are younger than 60 years of age. ? You are older than 60 years of age and your health care provider  tells you that you are at risk for this type of infection. ? Your sexual activity has changed since you were last screened and you are at an increased risk for chlamydia or gonorrhea. Ask your health care provider if you are at risk.  If you do not have HIV, but are at risk, it may be recommended that you take a prescription medicine daily to prevent HIV infection. This is called pre-exposure prophylaxis (PrEP). You are considered at risk if: ? You are sexually active and do not regularly use condoms or know the HIV status of your partner(s). ? You take drugs by injection. ? You are sexually active with a partner who has HIV.  Talk with your health care provider about whether you are at high risk of being infected with HIV. If you choose to begin PrEP, you should first be tested for HIV. You should then be tested every 3 months for as long as you are taking PrEP. Pregnancy  If you are premenopausal and you may become pregnant, ask your health care provider about preconception counseling.  If you may become pregnant, take 400 to 800 micrograms (mcg) of folic acid every day.  If you want to prevent pregnancy, talk to your health care provider about birth control (contraception). Osteoporosis and menopause  Osteoporosis is a disease in which the bones lose minerals and strength with aging. This can result in serious bone fractures. Your risk for osteoporosis can be identified using a bone density scan.  If you are 44 years of age or older, or if you are at risk for osteoporosis and fractures, ask your health care provider if you should be screened.  Ask your health care provider whether you should take a calcium or vitamin D supplement to lower your risk for osteoporosis.  Menopause may have certain physical symptoms and risks.  Hormone replacement therapy may reduce some of these symptoms and risks. Talk to your health care provider about whether  hormone replacement therapy is right for  you. Follow these instructions at home:  Schedule regular health, dental, and eye exams.  Stay current with your immunizations.  Do not use any tobacco products including cigarettes, chewing tobacco, or electronic cigarettes.  If you are pregnant, do not drink alcohol.  If you are breastfeeding, limit how much and how often you drink alcohol.  Limit alcohol intake to no more than 1 drink per day for nonpregnant women. One drink equals 12 ounces of beer, 5 ounces of wine, or 1 ounces of hard liquor.  Do not use street drugs.  Do not share needles.  Ask your health care provider for help if you need support or information about quitting drugs.  Tell your health care provider if you often feel depressed.  Tell your health care provider if you have ever been abused or do not feel safe at home. This information is not intended to replace advice given to you by your health care provider. Make sure you discuss any questions you have with your health care provider. Document Released: 09/10/2010 Document Revised: 08/03/2015 Document Reviewed: 11/29/2014 Elsevier Interactive Patient Education  2018 [employer].  How to Avoid Diabetes Mellitus Problems You can take action to prevent or slow down problems that are caused by diabetes (diabetes mellitus). Following your diabetes plan and taking care of yourself can reduce your risk of serious or life-threatening complications. Manage your diabetes  Follow instructions from your health care providers about managing your diabetes. Your diabetes may be managed by a team of health care providers who can teach you how to care for yourself and can answer questions that you have.  Educate yourself about your condition so you can make healthy choices about eating and physical activity.  Check your blood sugar (glucose) levels as often as directed. Your health care provider will help you decide how often to check your blood glucose level depending on  your treatment goals and how well you are meeting them.  Ask your health care provider if you should take low-dose aspirin daily and what dose is recommended for you. Taking low-dose aspirin daily is recommended to help prevent cardiovascular disease. Do not use nicotine or tobacco Do not use any products that contain nicotine or tobacco, such as cigarettes and e-cigarettes. If you need help quitting, ask your health care provider. Nicotine raises your risk for diabetes problems. If you quit using nicotine:  You will lower your risk for heart attack, stroke, nerve disease, and kidney disease.  Your cholesterol and blood pressure may improve.  Your blood circulation will improve.  Keep your blood pressure under control To control your blood pressure:  Follow instructions from your health care provider about meal planning, exercise, and medicines.  Make sure your health care provider checks your blood pressure at every medical visit.  A blood pressure reading consists of two numbers. Generally, the goal is to keep your top number (systolic pressure) at or below 130, and your bottom number (diastolic pressure) at or below 80. Your health care provider may recommend a lower target blood pressure. Your individualized target blood pressure is determined based on:  Your age.  Your medicines.  How long you have had diabetes.  Any other medical conditions you have.  Keep your cholesterol under control To control your cholesterol:  Follow instructions from your health care provider about meal planning, exercise, and medicines.  Have your cholesterol checked at least once a year.  You may be prescribed medicine  to lower cholesterol (statin). If you are not taking a statin, ask your health care provider if you should be.  Controlling your cholesterol may:  Help prevent heart disease and stroke. These are the most common health problems for people with diabetes.  Improve your blood  flow.  Schedule and keep yearly physical exams and eye exams Your health care provider will tell you how often you need medical visits depending on your diabetes management plan. Keep all follow-up visits as directed. This is important so possible problems can be identified early and complications can be avoided or treated.  Every visit with your health care provider should include measuring your: ? Weight. ? Blood pressure. ? Blood glucose control.  Your A1c (hemoglobin A1c) level should be checked: ? At least 2 times a year, if you are meeting your treatment goals. ? 4 times a year, if you are not meeting treatment goals or if your treatment goals have changed.  Your blood lipids (lipid profile) should be checked yearly. You should also be checked yearly for protein in your urine (urine microalbumin).  If you have type 1 diabetes, get an eye exam 3-5 years after you are diagnosed, and then once a year after your first exam.  If you have type 2 diabetes, get an eye exam as soon as you are diagnosed, and then once a year after your first exam.  Keep your vaccines current It is recommended that you receive:  pneumonia (pneumococcal) vaccine and a hepatitis B vaccine. If you are age 74 or older, you may get the pneumonia vaccine as a series of two separate shots.  Ask your health care provider which other vaccines may be recommended. Take care of your feet Diabetes may cause you to have poor blood circulation to your legs and feet. Because of this, taking care of your feet is very important. Diabetes can cause:  The skin on the feet to get thinner, break more easily, and heal more slowly.  Nerve damage in your legs and feet, which results in decreased feeling. You may not notice minor injuries that could lead to serious problems.  To avoid foot problems:  Check your skin and feet every day for cuts, bruises, redness, blisters, or sores.  Schedule a foot exam with your health care  provider once every year. This exam includes: ? Inspecting of the structure and skin of your feet. ? Checking the pulses and sensation in your feet.  Make sure that your health care provider performs a visual foot exam at every medical visit.  Take care of your teeth People with poorly controlled diabetes are more likely to have gum (periodontal) disease. Diabetes can make periodontal diseases harder to control. If not treated, periodontal diseases can lead to tooth loss. To prevent this:  Brush your teeth twice a day.  Floss at least once a day.  Visit your dentist 2 times a year.  Drink responsibly Limit alcohol intake to no more than 1 drink a day for nonpregnant women and 2 drinks a day for men. One drink equals 12 oz of beer, 5 oz of wine, or 1 oz of hard liquor. It is important to eat food when you drink alcohol to avoid low blood glucose (hypoglycemia). Avoid alcohol if you:  Have a history of alcohol abuse or dependence.  Are pregnant.  Have liver disease, pancreatitis, advanced neuropathy, or severe hypertriglyceridemia.  Lessen stress Living with diabetes can be stressful. When you are experiencing stress, your blood glucose  may be affected in two ways:  Stress hormones may cause your blood glucose to rise.  You may be distracted from taking good care of yourself.  Be aware of your stress level and make changes to help you manage challenging situations. To lower your stress levels:  Consider joining a support group.  Do planned relaxation or meditation.  Do a hobby that you enjoy.  Maintain healthy relationships.  Exercise regularly.  Work with your health care provider or a mental health professional.  Summary  You can take action to prevent or slow down problems that are caused by diabetes (diabetes mellitus). Following your diabetes plan and taking care of yourself can reduce your risk of serious or life-threatening complications.  Follow instructions  from your health care providers about managing your diabetes. Your diabetes may be managed by a team of health care providers who can teach you how to care for yourself and can answer questions that you have.  Your health care provider will tell you how often you need medical visits depending on your diabetes management plan. Keep all follow-up visits as directed. This is important so possible problems can be identified early and complications can be avoided or treated. This information is not intended to replace advice given to you by your health care provider. Make sure you discuss any questions you have with your health care provider. Document Released: 11/13/2010 Document Revised: 11/25/2015 Document Reviewed: 11/25/2015 Elsevier Interactive Patient Education  2018 [employer]. Lipoma A lipoma is a noncancerous (benign) tumor that is made up of fat cells. This is a very common type of soft-tissue growth. Lipomas are usually found under the skin (subcutaneous). They may occur in any tissue of the body that contains fat. Common areas for lipomas to appear include the back, shoulders, buttocks, and thighs. Lipomas grow slowly, and they are usually painless. Most lipomas do not cause problems and do not require treatment. What are the causes? The cause of this condition is not known. What increases the risk? This condition is more likely to develop in:  People who are 54-26 years old.  People who have a family history of lipomas.  What are the signs or symptoms? A lipoma usually appears as a small, round bump under the skin. It may feel soft or rubbery, but the firmness can vary. Most lipomas are not painful. However, a lipoma may become painful if it is located in an area where it pushes on nerves. How is this diagnosed? A lipoma can usually be diagnosed with a physical exam. You may also have tests to confirm the diagnosis and to rule out other conditions. Tests may include:  Imaging tests,  such as a CT scan or MRI.  Removal of a tissue sample to be looked at under a microscope (biopsy).  How is this treated? Treatment is not needed for small lipomas that are not causing problems. If a lipoma continues to get bigger or it causes problems, removal is often the best option. Lipomas can also be removed to improve appearance. Removal of a lipoma is usually done with a surgery in which the fatty cells and the surrounding capsule are removed. Most often, a medicine that numbs the area (local anesthetic) is used for this procedure. Follow these instructions at home:  Keep all follow-up visits as directed by your health care provider. This is important. Contact a health care provider if:  Your lipoma becomes larger or hard.  Your lipoma becomes painful, red, or increasingly swollen.  These could be signs of infection or a more serious condition. This information is not intended to replace advice given to you by your health care provider. Make sure you discuss any questions you have with your health care provider. Document Released: 02/15/2002 Document Revised: 08/03/2015 Document Reviewed: 02/21/2014 Elsevier Interactive Patient Education  [employer].

## 2017-01-24 ENCOUNTER — Telehealth: Payer: Self-pay | Admitting: Emergency Medicine

## 2017-01-24 ENCOUNTER — Telehealth: Payer: Self-pay | Admitting: Family Medicine

## 2017-01-24 NOTE — Telephone Encounter (Signed)
error 

## 2017-01-24 NOTE — Telephone Encounter (Signed)
Patient called for results- patient informed of results. She still has Crestor and she is going to try it again and will let provider know if she has difficultly. She has been taking Metformin twice daily- but she admits she has not been following a strict diet. She did state that she might benefit from seeing a nutritionalist if her provider thought that would be beneficial. She has a follow up at the beginning of the year and she will review how she is doing at that time

## 2017-01-24 NOTE — Telephone Encounter (Signed)
Message from Romana Juniper RN has been copied and paste to patient result note and forwarded to PCP for review.

## 2017-01-29 ENCOUNTER — Other Ambulatory Visit: Payer: Self-pay | Admitting: Emergency Medicine

## 2017-01-29 DIAGNOSIS — R7303 Prediabetes: Secondary | ICD-10-CM

## 2017-03-05 ENCOUNTER — Other Ambulatory Visit: Payer: Self-pay | Admitting: Family Medicine

## 2017-03-05 DIAGNOSIS — G629 Polyneuropathy, unspecified: Secondary | ICD-10-CM

## 2017-03-14 ENCOUNTER — Other Ambulatory Visit: Payer: Self-pay | Admitting: Emergency Medicine

## 2017-03-14 DIAGNOSIS — G629 Polyneuropathy, unspecified: Secondary | ICD-10-CM

## 2017-03-14 MED ORDER — GABAPENTIN 100 MG PO CAPS: 100.0000 mg | ORAL_CAPSULE | Freq: Three times a day (TID) | ORAL | 0 refills | Status: DC

## 2017-03-26 ENCOUNTER — Ambulatory Visit: Payer: BC Managed Care – PPO | Admitting: Family Medicine

## 2017-03-26 ENCOUNTER — Encounter: Payer: Self-pay | Admitting: Family Medicine

## 2017-03-26 VITALS — BP 130/70 | HR 74 | Temp 98.1°F

## 2017-03-26 DIAGNOSIS — E119 Type 2 diabetes mellitus without complications: Secondary | ICD-10-CM

## 2017-03-26 DIAGNOSIS — M79652 Pain in left thigh: Secondary | ICD-10-CM | POA: Diagnosis not present

## 2017-03-26 LAB — GLUCOSE, POCT (MANUAL RESULT ENTRY): POC Glucose: 142 mg/dl — AB (ref 70–99)

## 2017-03-26 MED ORDER — GABAPENTIN 300 MG PO CAPS: 300.0000 mg | ORAL_CAPSULE | Freq: Three times a day (TID) | ORAL | 3 refills | Status: DC

## 2017-03-26 NOTE — Progress Notes (Signed)
Subjective:    Patient ID: Caroline Velez, female    DOB: 05/06/1955, 62 y.o.   MRN: [id number]  Chief Complaint  Patient presents with  . Follow-up    HPI Patient was seen today for f/u on DM.  Pt states she has been doing well.  Pt occasionally checks her fsbs at home.  Typically 94-128 in am.  Pt is taking Metformin 500 mg BID.  Pt states she has not taken her meds yet this am.  She also states she had fries and tea this am.  Pt mentions she is still having L thigh pain, burning, and a sensation of heat coming from it.  Pt states the feeling starts after she walks more than 3000 steps.  The feeling does not radiate from her butt or go into her lower leg/foot, but stays in the thigh.  Pt states it feels like "something is sticking in it".  Pt also endorses feeling lightheaded during these episodes.  Pt may have to rub her thigh to get the feeling back in it.  Pt was started on Gabapentin 100 mg TID, but has not noticed a difference on it.  Pt points to mainly the middle of her thigh as the area of discomfort.   Pt states she has not heard anything about the referral to Dermatology for a lipoma.  Past Medical History:  Diagnosis Date  . Allergy   . Diabetes mellitus without complication ([place])   . Hyperlipidemia   . 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     No Known Allergies  ROS General: Denies fever, chills, night sweats, changes in weight, changes in appetite HEENT: Denies headaches, ear pain, changes in vision, rhinorrhea, sore throat CV: Denies CP, palpitations, SOB, orthopnea Pulm: Denies SOB, cough, wheezing GI: Denies abdominal pain, nausea, vomiting, diarrhea, constipation GU: Denies dysuria, hematuria, frequency, vaginal discharge Msk: Denies muscle cramps, joint pains  +L thigh pain Neuro: Denies weakness  + L thigh numbness, tingling Skin: Denies rashes, bruising Psych: Denies depression, anxiety, hallucinations     Objective:    Blood pressure 130/70,  pulse 74, temperature 98.1 F (36.7 C), temperature source Oral.   Gen. Pleasant, well-nourished, in no distress, normal affect   HEENT: [place]/AT, face symmetric, conjunctiva clear, no scleral icterus, PERRLA, nares patent without drainage Lungs: no accessory muscle use, CTAB, no wheezes or rales Cardiovascular: RRR, no m/r/g, no peripheral edema Abdomen: BS present, soft, NT/ND Musculoskeletal: No deformities, no cyanosis or clubbing, normal tone.  No TTP of spine, paraspinal muscles, L hip, L thigh, strength 5/5 in LEs b/l. Neuro:  A&Ox3, CN II-XII intact, normal gait.  Sensation intact on thighs b/l. Skin:  Warm, no lesions/ rash.  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 just above the bra line, unchanged from previous exam   Wt Readings from Last 3 Encounters:  01/22/17 235 lb 11.2 oz (106.9 kg)  01/21/14 255 lb (115.7 kg)  01/07/14 254 lb 8 oz (115.4 kg)    Lab Results  Component Value Date   WBC 5.7 01/22/2017   HGB 12.2 01/22/2017   HCT 37.8 01/22/2017   PLT 327.0 01/22/2017   GLUCOSE 106 (H) 01/22/2017   CHOL 265 (H) 01/22/2017   TRIG 114.0 01/22/2017   HDL 41.00 01/22/2017   LDLCALC 201 (H) 01/22/2017   ALT 14 01/22/2017   AST 16 01/22/2017   NA 141 01/22/2017   K 4.6 01/22/2017   CL 104 01/22/2017   CREATININE  0.88 01/22/2017   BUN 13 01/22/2017   CO2 30 01/22/2017   TSH 0.646 01/06/2014   INR 1.07 05/02/2009   HGBA1C 6.4 01/22/2017    Assessment/Plan:  Controlled type 2 diabetes mellitus without complication, without long-term current use of insulin ([place])  - Plan: POCT glucose (manual entry)  142 -Continue metformin 500 mg twice daily -Continue lifestyle modifications including weight loss -Hemoglobin A1c 6.4% on 01/22/2017 -no h/o peripheral neuropathy. -f/u in next 3 months  Pain of left thigh -Discussed need for further studies including imaging of back (such as lumbar xray) and possible EMG.  At this time patient wishes to  wait. -We will increase dose of gabapentin from 100 mg TID to 300 mg TID - Plan: gabapentin (NEURONTIN) 300 MG capsule  -f/u in 1 month, sooner if needed.   Updated patient and referral to dermatology.  Apparently dermatology has been trying to reach patient.  Patient is to call [facility]- Dermatology office at [phone] for appointment details.   F/u in 1 month if thigh pain continues.  Pt to notify clinic if she would like to proceed with further imaging or studies.   Grier Mitts, MD

## 2017-03-26 NOTE — Patient Instructions (Addendum)
Lipoma A lipoma is a noncancerous (benign) tumor that is made up of fat cells. This is a very common type of soft-tissue growth. Lipomas are usually found under the skin (subcutaneous). They may occur in any tissue of the body that contains fat. Common areas for lipomas to appear include the back, shoulders, buttocks, and thighs. Lipomas grow slowly, and they are usually painless. Most lipomas do not cause problems and do not require treatment. What are the causes? The cause of this condition is not known. What increases the risk? This condition is more likely to develop in:  People who are 40-60 years old.  People who have a family history of lipomas.  What are the signs or symptoms? A lipoma usually appears as a small, round bump under the skin. It may feel soft or rubbery, but the firmness can vary. Most lipomas are not painful. However, a lipoma may become painful if it is located in an area where it pushes on nerves. How is this diagnosed? A lipoma can usually be diagnosed with a physical exam. You may also have tests to confirm the diagnosis and to rule out other conditions. Tests may include:  Imaging tests, such as a CT scan or MRI.  Removal of a tissue sample to be looked at under a microscope (biopsy).  How is this treated? Treatment is not needed for small lipomas that are not causing problems. If a lipoma continues to get bigger or it causes problems, removal is often the best option. Lipomas can also be removed to improve appearance. Removal of a lipoma is usually done with a surgery in which the fatty cells and the surrounding capsule are removed. Most often, a medicine that numbs the area (local anesthetic) is used for this procedure. Follow these instructions at home:  Keep all follow-up visits as directed by your health care provider. This is important. Contact a health care provider if:  Your lipoma becomes larger or hard.  Your lipoma becomes painful, red, or  increasingly swollen. These could be signs of infection or a more serious condition. This information is not intended to replace advice given to you by your health care provider. Make sure you discuss any questions you have with your health care provider. Document Released: 02/15/2002 Document Revised: 08/03/2015 Document Reviewed: 02/21/2014 Elsevier Interactive Patient Education  2018 [employer]  

## 2017-03-27 ENCOUNTER — Other Ambulatory Visit: Payer: Self-pay | Admitting: Family Medicine

## 2017-03-27 DIAGNOSIS — Z1231 Encounter for screening mammogram for malignant neoplasm of breast: Secondary | ICD-10-CM

## 2017-04-02 ENCOUNTER — Ambulatory Visit (HOSPITAL_COMMUNITY): Payer: BC Managed Care – PPO

## 2017-04-03 ENCOUNTER — Ambulatory Visit (HOSPITAL_COMMUNITY): Payer: BC Managed Care – PPO

## 2017-06-04 ENCOUNTER — Encounter: Payer: Self-pay | Admitting: Family Medicine

## 2017-06-04 ENCOUNTER — Ambulatory Visit: Payer: BC Managed Care – PPO | Admitting: Family Medicine

## 2017-06-04 VITALS — BP 120/74 | HR 72 | Temp 97.7°F

## 2017-06-04 DIAGNOSIS — M25571 Pain in right ankle and joints of right foot: Secondary | ICD-10-CM | POA: Diagnosis not present

## 2017-06-04 DIAGNOSIS — M546 Pain in thoracic spine: Secondary | ICD-10-CM | POA: Diagnosis not present

## 2017-06-04 DIAGNOSIS — R0789 Other chest pain: Secondary | ICD-10-CM

## 2017-06-04 LAB — BASIC METABOLIC PANEL
BUN: 15 mg/dL (ref 6–23)
CO2: 28 meq/L (ref 19–32)
CREATININE: 0.79 mg/dL (ref 0.40–1.20)
Calcium: 9.4 mg/dL (ref 8.4–10.5)
Chloride: 105 mEq/L (ref 96–112)
GFR: 95.12 mL/min (ref >60.00)
Glucose, Bld: 105 mg/dL — ABNORMAL HIGH (ref 70–99)
POTASSIUM: 4.3 meq/L (ref 3.5–5.1)
Sodium: 139 mEq/L (ref 135–145)

## 2017-06-04 LAB — TROPONIN I: TNIDX: 0 ug/l (ref 0.00–0.06)

## 2017-06-04 MED ORDER — CYCLOBENZAPRINE HCL 5 MG PO TABS: 5.0000 mg | ORAL_TABLET | Freq: Two times a day (BID) | ORAL | 0 refills | Status: DC | PRN

## 2017-06-04 NOTE — Progress Notes (Signed)
Subjective:    Patient ID: Caroline Velez, female    DOB: May 12, 1955, 62 y.o.   MRN: [id number]  Chief Complaint  Patient presents with  . Ankle Pain    HPI Patient was seen today for several ongoing concerns.  Patient endorses right anterior ankle pain times 1 week.  She does not recall injury but endorses mild edema and pain with standing and walking.  Also endorses mid back pain.  Pain is described as an ache will become worse throughout the day.  Patient states she has taken Aleve which helps with the pain.  Patient does not recall injuring her back, hearing, heavy lifting.  Patient does have large breast and states she typically wear supportive bras.  Patient endorses chest tightness times 2 weeks.  She states initially she was at work walking when she had to stop and hold onto the wall because she felt like "a ton of bricks on her chest" in her left arm was numb.  Patient states the feeling lasted for about a minute.  She did not go to the hospital or call the clinic.  Patient states the feeling is improving but is still there.  Patient finally scheduled appointment with dermatology for lipoma removal.  However she called and canceled the appointment because she is afraid of needles.  Past Medical History:  Diagnosis Date  . Allergy   . Diabetes mellitus without complication ([place])   . Hyperlipidemia   . 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     No Known Allergies  ROS General: Denies fever, chills, night sweats, changes in weight, changes in appetite HEENT: Denies headaches, ear pain, changes in vision, rhinorrhea, sore throat CV: Denies CP, palpitations, SOB, orthopnea  +chest tightness Pulm: Denies SOB, cough, wheezing GI: Denies abdominal pain, nausea, vomiting, diarrhea, constipation GU: Denies dysuria, hematuria, frequency, vaginal discharge Msk: Denies muscle cramps, joint pains  +back pain, R ankle pain Neuro: Denies weakness, numbness, tingling Skin:  Denies rashes, bruising Psych: Denies depression, anxiety, hallucinations     Objective:    Blood pressure 120/74, pulse 72, temperature 97.7 F (36.5 C), temperature source Oral, SpO2 95 %.   Gen. Pleasant, well-nourished, in no distress, normal affect   HEENT: [place]/AT, face symmetric, no scleral icterus, PERRLA, nares patent without drainage, pharynx without erythema or exudate. Lungs: no accessory muscle use, CTAB, no wheezes or rales Cardiovascular: RRR, no m/r/g, no peripheral edema Musculoskeletal: No deformities, no cyanosis or clubbing, normal tone.  TTP of the anterior ankle with mild edema, no erythema or increased warmth compared to the left.  No pitting edema. limited ROM of right ankle 2/2 pain.  No pain with inversion of foot.  limited eversion 2/2 pain.  Pain with dorsiflexion.  TTP of thoracic spine midline.  No TTP of paraspinal muscles.  Normal ROM of spine with some discomfort. No TTP of chest wall. Neuro:  A&Ox3, CN II-XII intact, normal gait Skin:  Warm, no lesions/ rash   Wt Readings from Last 3 Encounters:  01/22/17 235 lb 11.2 oz (106.9 kg)  01/21/14 255 lb (115.7 kg)  01/07/14 254 lb 8 oz (115.4 kg)    Lab Results  Component Value Date   WBC 5.7 01/22/2017   HGB 12.2 01/22/2017   HCT 37.8 01/22/2017   PLT 327.0 01/22/2017   GLUCOSE 105 (H) 06/04/2017   CHOL 265 (H) 01/22/2017   TRIG 114.0 01/22/2017   HDL 41.00 01/22/2017   LDLCALC 201 (H) 01/22/2017   ALT 14 01/22/2017  AST 16 01/22/2017   NA 139 06/04/2017   K 4.3 06/04/2017   CL 105 06/04/2017   CREATININE 0.79 06/04/2017   BUN 15 06/04/2017   CO2 28 06/04/2017   TSH 0.646 01/06/2014   INR 1.07 05/02/2009   HGBA1C 6.4 01/22/2017    Assessment/Plan:  Chest tightness -improving -discussed possible causes including stress/anxiety, cardiac, pulm, infectious -EKG done this visit normal sinus rhythm.  Compared to previous studies 12/2013. - Plan: EKG [id number], Basic metabolic panel, Troponin  I -Regular exercise and weight loss encouraged. -Patient given RTC or ED precautions.  Acute midline thoracic back pain -Likely muscle strain -Discussed wearing supportive bra and regular exercise. -Given handout - Plan: cyclobenzaprine (FLEXERIL) 5 MG tablet  Acute right ankle pain -Supportive care encouraged including NSAIDs, rest, ice/heat -Given handout  Follow-up PRN in the next few weeks, sooner if symptoms do not improve  Grier Mitts, MD

## 2017-06-04 NOTE — Patient Instructions (Signed)
Ankle Pain Many things can cause ankle pain, including an injury to the area and overuse of the ankle.The ankle joint holds your body weight and allows you to move around. Ankle pain can occur on either side or the back of one ankle or both ankles. Ankle pain may be sharp and burning or dull and aching. There may be tenderness, stiffness, redness, or warmth around the ankle. Follow these instructions at home: Activity  Rest your ankle as told by your health care provider. Avoid any activities that cause ankle pain.  Do exercises as told by your health care provider.  Ask your health care provider if you can drive. Using a brace, a bandage, or crutches  If you were given a brace: ? Wear it as told by your health care provider. ? Remove it when you take a bath or a shower. ? Try not to move your ankle very much, but wiggle your toes from time to time. This helps to prevent swelling.  If you were given an elastic bandage: ? Remove it when you take a bath or a shower. ? Try not to move your ankle very much, but wiggle your toes from time to time. This helps to prevent swelling. ? Adjust the bandage to make it more comfortable if it feels too tight. ? Loosen the bandage if you have numbness or tingling in your foot or if your foot turns cold and blue.  If you have crutches, use them as told by your health care provider. Continue to use them until you can walk without feeling pain in your ankle. Managing pain, stiffness, and swelling  Raise (elevate) your ankle above the level of your heart while you are sitting or lying down.  If directed, apply ice to the area: ? Put ice in a plastic bag. ? Place a towel between your skin and the bag. ? Leave the ice on for 20 minutes, 2-3 times per day. General instructions  Keep all follow-up visits as told by your health care provider. This is important.  Record this information that may be helpful for you and your health care provider: ? How  often you have ankle pain. ? Where the pain is located. ? What the pain feels like.  Take over-the-counter and prescription medicines only as told by your health care provider. Contact a health care provider if:  Your pain gets worse.  Your pain is not relieved with medicines.  You have a fever or chills.  You are having more trouble with walking.  You have new symptoms. Get help right away if:  Your foot, leg, toes, or ankle tingles or becomes numb.  Your foot, leg, toes, or ankle becomes swollen.  Your foot, leg, toes, or ankle turns pale or blue. This information is not intended to replace advice given to you by your health care provider. Make sure you discuss any questions you have with your health care provider. Document Released: 08/15/2009 Document Revised: 10/27/2015 Document Reviewed: 09/27/2014 Elsevier Interactive Patient Education  2018 [employer].  Back Pain, Adult Many adults have back pain from time to time. Common causes of back pain include:  A strained muscle or ligament.  Wear and tear (degeneration) of the spinal disks.  Arthritis.  A hit to the back.  Back pain can be short-lived (acute) or last a long time (chronic). A physical exam, lab tests, and imaging studies may be done to find the cause of your pain. Follow these instructions at home: Managing  pain and stiffness  Take over-the-counter and prescription medicines only as told by your health care provider.  If directed, apply heat to the affected area as often as told by your health care provider. Use the heat source that your health care provider recommends, such as a moist heat pack or a heating pad. ? Place a towel between your skin and the heat source. ? Leave the heat on for 20-30 minutes. ? Remove the heat if your skin turns bright red. This is especially important if you are unable to feel pain, heat, or cold. You have a greater risk of getting burned.  If directed, apply ice to the  injured area: ? Put ice in a plastic bag. ? Place a towel between your skin and the bag. ? Leave the ice on for 20 minutes, 2-3 times a day for the first 2-3 days. Activity  Do not stay in bed. Resting more than 1-2 days can delay your recovery.  Take short walks on even surfaces as soon as you are able. Try to increase the length of time you walk each day.  Do not sit, drive, or stand in one place for more than 30 minutes at a time. Sitting or standing for long periods of time can put stress on your back.  Use proper lifting techniques. When you bend and lift, use positions that put less stress on your back: ? [place] your knees. ? Keep the load close to your body. ? Avoid twisting.  Exercise regularly as told by your health care provider. Exercising will help your back heal faster. This also helps prevent back injuries by keeping muscles strong and flexible.  Your health care provider may recommend that you see a physical therapist. This person can help you come up with a safe exercise program. Do any exercises as told by your physical therapist. Lifestyle  Maintain a healthy weight. Extra weight puts stress on your back and makes it difficult to have good posture.  Avoid activities or situations that make you feel anxious or stressed. Learn ways to manage anxiety and stress. One way to manage stress is through exercise. Stress and anxiety increase muscle tension and can make back pain worse. General instructions  Sleep on a firm mattress in a comfortable position. Try lying on your side with your knees slightly bent. If you lie on your back, put a pillow under your knees.  Follow your treatment plan as told by your health care provider. This may include: ? Cognitive or behavioral therapy. ? Acupuncture or massage therapy. ? Meditation or yoga. Contact a health care provider if:  You have pain that is not relieved with rest or medicine.  You have increasing pain going down into  your legs or buttocks.  Your pain does not improve in 2 weeks.  You have pain at night.  You lose weight.  You have a fever or chills. Get help right away if:  You develop new bowel or bladder control problems.  You have unusual weakness or numbness in your arms or legs.  You develop nausea or vomiting.  You develop abdominal pain.  You feel faint. Summary  Many adults have back pain from time to time. A physical exam, lab tests, and imaging studies may be done to find the cause of your pain.  Use proper lifting techniques. When you bend and lift, use positions that put less stress on your back.  Take over-the-counter and prescription medicines and apply heat or ice as  directed by your health care provider. This information is not intended to replace advice given to you by your health care provider. Make sure you discuss any questions you have with your health care provider. Document Released: 02/25/2005 Document Revised: 04/01/2016 Document Reviewed: 04/01/2016 Elsevier Interactive Patient Education  2018 [employer].  Back Exercises If you have pain in your back, do these exercises 2-3 times each day or as told by your doctor. When the pain goes away, do the exercises once each day, but repeat the steps more times for each exercise (do more repetitions). If you do not have pain in your back, do these exercises once each day or as told by your doctor. Exercises Single Knee to Chest  Do these steps 3-5 times in a row for each leg: 1. Lie on your back on a firm bed or the floor with your legs stretched out. 2. Bring one knee to your chest. 3. Hold your knee to your chest by grabbing your knee or thigh. 4. Pull on your knee until you feel a gentle stretch in your lower back. 5. Keep doing the stretch for 10-30 seconds. 6. Slowly let go of your leg and straighten it.  Pelvic Tilt  Do these steps 5-10 times in a row: 1. Lie on your back on a firm bed or the floor with  your legs stretched out. 2. Bend your knees so they point up to the ceiling. Your feet should be flat on the floor. 3. Tighten your lower belly (abdomen) muscles to press your lower back against the floor. This will make your tailbone point up to the ceiling instead of pointing down to your feet or the floor. 4. Stay in this position for 5-10 seconds while you gently tighten your muscles and breathe evenly.  Cat-Cow  Do these steps until your lower back bends more easily: 1. Get on your hands and knees on a firm surface. Keep your hands under your shoulders, and keep your knees under your hips. You may put padding under your knees. 2. Let your head hang down, and make your tailbone point down to the floor so your lower back is round like the back of a cat. 3. Stay in this position for 5 seconds. 4. Slowly lift your head and make your tailbone point up to the ceiling so your back hangs low (sags) like the back of a cow. 5. Stay in this position for 5 seconds.  Press-Ups  Do these steps 5-10 times in a row: 1. Lie on your belly (face-down) on the floor. 2. Place your hands near your head, about shoulder-width apart. 3. While you keep your back relaxed and keep your hips on the floor, slowly straighten your arms to raise the top half of your body and lift your shoulders. Do not use your back muscles. To make yourself more comfortable, you may change where you place your hands. 4. Stay in this position for 5 seconds. 5. Slowly return to lying flat on the floor.  Bridges  Do these steps 10 times in a row: 1. Lie on your back on a firm surface. 2. Bend your knees so they point up to the ceiling. Your feet should be flat on the floor. 3. Tighten your butt muscles and lift your butt off of the floor until your waist is almost as high as your knees. If you do not feel the muscles working in your butt and the back of your thighs, slide your feet 1-2 inches  farther away from your butt. 4. Stay in  this position for 3-5 seconds. 5. Slowly lower your butt to the floor, and let your butt muscles relax.  If this exercise is too easy, try doing it with your arms crossed over your chest. Belly Crunches  Do these steps 5-10 times in a row: 1. Lie on your back on a firm bed or the floor with your legs stretched out. 2. Bend your knees so they point up to the ceiling. Your feet should be flat on the floor. 3. Cross your arms over your chest. 4. Tip your chin a little bit toward your chest but do not bend your neck. 5. Tighten your belly muscles and slowly raise your chest just enough to lift your shoulder blades a tiny bit off of the floor. 6. Slowly lower your chest and your head to the floor.  Back Lifts Do these steps 5-10 times in a row: 1. Lie on your belly (face-down) with your arms at your sides, and rest your forehead on the floor. 2. Tighten the muscles in your legs and your butt. 3. Slowly lift your chest off of the floor while you keep your hips on the floor. Keep the back of your head in line with the curve in your back. Look at the floor while you do this. 4. Stay in this position for 3-5 seconds. 5. Slowly lower your chest and your face to the floor.  Contact a doctor if:  Your back pain gets a lot worse when you do an exercise.  Your back pain does not lessen 2 hours after you exercise. If you have any of these problems, stop doing the exercises. Do not do them again unless your doctor says it is okay. Get help right away if:  You have sudden, very bad back pain. If this happens, stop doing the exercises. Do not do them again unless your doctor says it is okay. This information is not intended to replace advice given to you by your health care provider. Make sure you discuss any questions you have with your health care provider. Document Released: 03/30/2010 Document Revised: 08/03/2015 Document Reviewed: 04/21/2014 Elsevier Interactive Patient Education  2018 [employer].  Nonspecific Chest Pain Chest pain can be caused by many different conditions. There is a chance that your pain could be related to something serious, such as a heart attack or a blood clot in your lungs. Chest pain can also be caused by conditions that are not life-threatening. If you have chest pain, it is very important to follow up with your doctor. Follow these instructions at home: Medicines  If you were prescribed an antibiotic medicine, take it as told by your doctor. Do not stop taking the antibiotic even if you start to feel better.  Take over-the-counter and prescription medicines only as told by your doctor. Lifestyle  Do not use any products that contain nicotine or tobacco, such as cigarettes and e-cigarettes. If you need help quitting, ask your doctor.  Do not drink alcohol.  Make lifestyle changes as told by your doctor. These may include: ? Getting regular exercise. Ask your doctor for some activities that are safe for you. ? Eating a heart-healthy diet. A diet specialist (dietitian) can help you to learn healthy eating options. ? Staying at a healthy weight. ? Managing diabetes, if needed. ? Lowering your stress, as with deep breathing or spending time in nature. General instructions  Avoid any activities that make you feel chest  pain.  If your chest pain is because of heartburn: ? Raise (elevate) the head of your bed about 6 inches (15 cm). You can do this by putting blocks under the bed legs at the head of the bed. ? Do not sleep with extra pillows under your head. That does not help heartburn.  Keep all follow-up visits as told by your doctor. This is important. This includes any further testing if your chest pain does not go away. Contact a doctor if:  Your chest pain does not go away.  You have a rash with blisters on your chest.  You have a fever.  You have chills. Get help right away if:  Your chest pain is worse.  You have a cough that gets  worse, or you cough up blood.  You have very bad (severe) pain in your belly (abdomen).  You are very weak.  You pass out (faint).  You have either of these for no clear reason: ? Sudden chest discomfort. ? Sudden discomfort in your arms, back, neck, or jaw.  You have shortness of breath at any time.  You suddenly start to sweat, or your skin gets clammy.  You feel sick to your stomach (nauseous).  You throw up (vomit).  You suddenly feel light-headed or dizzy.  Your heart starts to beat fast, or it feels like it is skipping beats. These symptoms may be an emergency. Do not wait to see if the symptoms will go away. Get medical help right away. Call your local emergency services (911 in the [place] Do not drive yourself to the hospital. This information is not intended to replace advice given to you by your health care provider. Make sure you discuss any questions you have with your health care provider. Document Released: 08/14/2007 Document Revised: 11/20/2015 Document Reviewed: 11/20/2015 Elsevier Interactive Patient Education  2017 [employer].

## 2017-06-06 ENCOUNTER — Encounter: Payer: Self-pay | Admitting: Family Medicine

## 2017-06-09 ENCOUNTER — Telehealth: Payer: Self-pay | Admitting: Family Medicine

## 2017-06-09 NOTE — Telephone Encounter (Signed)
Patient notified of lab results.  results not in basket.

## 2017-06-10 ENCOUNTER — Encounter: Payer: Self-pay | Admitting: Family Medicine

## 2017-06-25 ENCOUNTER — Ambulatory Visit: Payer: BC Managed Care – PPO | Admitting: Family Medicine

## 2017-12-17 ENCOUNTER — Ambulatory Visit: Payer: BC Managed Care – PPO | Admitting: Family Medicine

## 2017-12-17 ENCOUNTER — Encounter: Payer: Self-pay | Admitting: Family Medicine

## 2017-12-17 ENCOUNTER — Telehealth: Payer: Self-pay | Admitting: Family Medicine

## 2017-12-17 VITALS — BP 98/76 | HR 72 | Temp 97.6°F | Wt 236.0 lb

## 2017-12-17 DIAGNOSIS — J0100 Acute maxillary sinusitis, unspecified: Secondary | ICD-10-CM | POA: Diagnosis not present

## 2017-12-17 DIAGNOSIS — H6983 Other specified disorders of Eustachian tube, bilateral: Secondary | ICD-10-CM

## 2017-12-17 MED ORDER — FLUCONAZOLE 150 MG PO TABS: 150.0000 mg | ORAL_TABLET | Freq: Once | ORAL | 1 refills | Status: AC

## 2017-12-17 MED ORDER — AMOXICILLIN 500 MG PO CAPS: 500.0000 mg | ORAL_CAPSULE | Freq: Two times a day (BID) | ORAL | 0 refills | Status: AC

## 2017-12-17 NOTE — Telephone Encounter (Signed)
Patient requesting preventative Diflucan, Please advise Tommi Rumps, thanks.    [place], [place] - [phone] [phone]

## 2017-12-17 NOTE — Telephone Encounter (Signed)
Copied from [place] [id number]. Topic: Quick Communication - Rx Refill/Question >> Dec 17, 2017 10:04 AM Oliver Pila B wrote: Medication: diflucan  Pt called b/c she was given an abx and she knows she will get a yeast infection   Preferred Pharmacy (with phone number or street name): [facility]  Agent: Please be advised that RX refills may take up to 3 business days. We ask that you follow-up with your pharmacy.

## 2017-12-17 NOTE — Progress Notes (Signed)
Subjective:    Patient ID: Caroline Velez, female    DOB: 08/30/1955, 62 y.o.   MRN: [id number]  No chief complaint on file.   HPI Patient was seen today for acute concern.  Pt notes sinus pressure and ears feeling "clogged" x 2 wks.  Pt states her symptoms started with rhinorrhea, sore throat, cough more at night, sinus pressure.  Pt took Claritin-D which "dried her out".  Pt states voices sound muffled and she does not hear everything sentences to her.  Patient unsure of sick contacts.  Denies fever, nausea, vomiting.  Past Medical History:  Diagnosis Date  . Allergy   . Diabetes mellitus without complication ([place])   . Hyperlipidemia   . 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     No Known Allergies  ROS General: Denies fever, chills, night sweats, changes in weight, changes in appetite HEENT: Denies headaches, ear pain, changes in vision+ rhinorrhea, sore throat-resolving, sinus pressure, ears clogged CV: Denies CP, palpitations, SOB, orthopnea Pulm: Denies SOB, wheezing  +cough at night GI: Denies abdominal pain, nausea, vomiting, diarrhea, constipation GU: Denies dysuria, hematuria, frequency, vaginal discharge Msk: Denies muscle cramps, joint pains Neuro: Denies weakness, numbness, tingling Skin: Denies rashes, bruising Psych: Denies depression, anxiety, hallucinations     Objective:    Blood pressure 98/76, pulse 72, temperature 97.6 F (36.4 C), temperature source Oral, weight 236 lb (107 kg), SpO2 96 %.   Gen. Pleasant, well-nourished, in no distress, normal affect   HEENT: [place]/AT, face symmetric, no scleral icterus, PERRLA, nares patent without drainage, pharynx without erythema or exudate.  TMs full b/l, no cervical lymphadenopathy. Lungs: no accessory muscle use, CTAB, no wheezes or rales Cardiovascular: RRR, no m/r/g, no peripheral edema Abdomen: BS present, soft, NT/ND Neuro:  A&Ox3, CN II-XII intact, normal gait   Wt Readings from Last 3  Encounters:  01/22/17 235 lb 11.2 oz (106.9 kg)  01/21/14 255 lb (115.7 kg)  01/07/14 254 lb 8 oz (115.4 kg)    Lab Results  Component Value Date   WBC 5.7 01/22/2017   HGB 12.2 01/22/2017   HCT 37.8 01/22/2017   PLT 327.0 01/22/2017   GLUCOSE 105 (H) 06/04/2017   CHOL 265 (H) 01/22/2017   TRIG 114.0 01/22/2017   HDL 41.00 01/22/2017   LDLCALC 201 (H) 01/22/2017   ALT 14 01/22/2017   AST 16 01/22/2017   NA 139 06/04/2017   K 4.3 06/04/2017   CL 105 06/04/2017   CREATININE 0.79 06/04/2017   BUN 15 06/04/2017   CO2 28 06/04/2017   TSH 0.646 01/06/2014   INR 1.07 05/02/2009   HGBA1C 6.4 01/22/2017    Assessment/Plan:  Acute maxillary sinusitis, recurrence not specified -Given handout -Continue nasal spray and symptomatic care as needed - Plan: amoxicillin (AMOXIL) 500 MG capsule  Dysfunction of both eustachian tubes -Continue nasal spray -Given handout  Follow-up PRN for continued or worsening symptoms  Grier Mitts, MD

## 2017-12-17 NOTE — Telephone Encounter (Signed)
Diflucan 150mg  has been sent to [facility] as requested Left message on patient machine making aware.  Nothing further needed.   Will send to Dr Volanda Napoleon as Juluis Rainier

## 2017-12-17 NOTE — Telephone Encounter (Signed)
ok 

## 2017-12-17 NOTE — Patient Instructions (Signed)
Sinusitis, Adult Sinusitis is soreness and inflammation of your sinuses. Sinuses are hollow spaces in the bones around your face. Your sinuses are located:  Around your eyes.  In the middle of your forehead.  Behind your nose.  In your cheekbones.  Your sinuses and nasal passages are lined with a stringy fluid (mucus). Mucus normally drains out of your sinuses. When your nasal tissues become inflamed or swollen, the mucus can become trapped or blocked so air cannot flow through your sinuses. This allows bacteria, viruses, and funguses to grow, which leads to infection. Sinusitis can develop quickly and last for 7?10 days (acute) or for more than 12 weeks (chronic). Sinusitis often develops after a cold. What are the causes? This condition is caused by anything that creates swelling in the sinuses or stops mucus from draining, including:  Allergies.  Asthma.  Bacterial or viral infection.  Abnormally shaped bones between the nasal passages.  Nasal growths that contain mucus (nasal polyps).  Narrow sinus openings.  Pollutants, such as chemicals or irritants in the air.  A foreign object stuck in the nose.  A fungal infection. This is rare.  What increases the risk? The following factors may make you more likely to develop this condition:  Having allergies or asthma.  Having had a recent cold or respiratory tract infection.  Having structural deformities or blockages in your nose or sinuses.  Having a weak immune system.  Doing a lot of swimming or diving.  Overusing nasal sprays.  Smoking.  What are the signs or symptoms? The main symptoms of this condition are pain and a feeling of pressure around the affected sinuses. Other symptoms include:  Upper toothache.  Earache.  Headache.  Bad breath.  Decreased sense of smell and taste.  A cough that may get worse at night.  Fatigue.  Fever.  Thick drainage from your nose. The drainage is often green and  it may contain pus (purulent).  Stuffy nose or congestion.  Postnasal drip. This is when extra mucus collects in the throat or back of the nose.  Swelling and warmth over the affected sinuses.  Sore throat.  Sensitivity to light.  How is this diagnosed? This condition is diagnosed based on symptoms, a medical history, and a physical exam. To find out if your condition is acute or chronic, your health care provider may:  Look in your nose for signs of nasal polyps.  Tap over the affected sinus to check for signs of infection.  View the inside of your sinuses using an imaging device that has a light attached (endoscope).  If your health care provider suspects that you have chronic sinusitis, you may also:  Be tested for allergies.  Have a sample of mucus taken from your nose (nasal culture) and checked for bacteria.  Have a mucus sample examined to see if your sinusitis is related to an allergy.  If your sinusitis does not respond to treatment and it lasts longer than 8 weeks, you may have an MRI or CT scan to check your sinuses. These scans also help to determine how severe your infection is. In rare cases, a bone biopsy may be done to rule out more serious types of fungal sinus disease. How is this treated? Treatment for sinusitis depends on the cause and whether your condition is chronic or acute. If a virus is causing your sinusitis, your symptoms will go away on their own within 10 days. You may be given medicines to relieve your symptoms,   including:  Topical nasal decongestants. They shrink swollen nasal passages and let mucus drain from your sinuses.  Antihistamines. These drugs block inflammation that is triggered by allergies. This can help to ease swelling in your nose and sinuses.  Topical nasal corticosteroids. These are nasal sprays that ease inflammation and swelling in your nose and sinuses.  Nasal saline washes. These rinses can help to get rid of thick mucus in  your nose.  If your condition is caused by bacteria, you will be given an antibiotic medicine. If your condition is caused by a fungus, you will be given an antifungal medicine. Surgery may be needed to correct underlying conditions, such as narrow nasal passages. Surgery may also be needed to remove polyps. Follow these instructions at home: Medicines  Take, use, or apply over-the-counter and prescription medicines only as told by your health care provider. These may include nasal sprays.  If you were prescribed an antibiotic medicine, take it as told by your health care provider. Do not stop taking the antibiotic even if you start to feel better. Hydrate and Humidify  Drink enough water to keep your urine clear or pale yellow. Staying hydrated will help to thin your mucus.  Use a cool mist humidifier to keep the humidity level in your home above 50%.  Inhale steam for 10-15 minutes, 3-4 times a day or as told by your health care provider. You can do this in the bathroom while a hot shower is running.  Limit your exposure to cool or dry air. Rest  Rest as much as possible.  Sleep with your head raised (elevated).  Make sure to get enough sleep each night. General instructions  Apply a warm, moist washcloth to your face 3-4 times a day or as told by your health care provider. This will help with discomfort.  Wash your hands often with soap and water to reduce your exposure to viruses and other germs. If soap and water are not available, use hand sanitizer.  Do not smoke. Avoid being around people who are smoking (secondhand smoke).  Keep all follow-up visits as told by your health care provider. This is important. Contact a health care provider if:  You have a fever.  Your symptoms get worse.  Your symptoms do not improve within 10 days. Get help right away if:  You have a severe headache.  You have persistent vomiting.  You have pain or swelling around your face or  eyes.  You have vision problems.  You develop confusion.  Your neck is stiff.  You have trouble breathing. This information is not intended to replace advice given to you by your health care provider. Make sure you discuss any questions you have with your health care provider. Document Released: 02/25/2005 Document Revised: 10/22/2015 Document Reviewed: 12/21/2014 Elsevier Interactive Patient Education  2018 [employer].  Eustachian Tube Dysfunction The eustachian tube connects the middle ear to the back of the nose. It regulates air pressure in the middle ear by allowing air to move between the ear and nose. It also helps to drain fluid from the middle ear space. When the eustachian tube does not function properly, air pressure, fluid, or both can build up in the middle ear. Eustachian tube dysfunction can affect one or both ears. What are the causes? This condition happens when the eustachian tube becomes blocked or cannot open normally. This may result from:  Ear infections.  Colds and other upper respiratory infections.  Allergies.  Irritation, such as  from cigarette smoke or acid from the stomach coming up into the esophagus (gastroesophageal reflux).  Sudden changes in air pressure, such as from descending in an airplane.  Abnormal growths in the nose or throat, such as nasal polyps, tumors, or enlarged tissue at the back of the throat (adenoids).  What increases the risk? This condition may be more likely to develop in people who smoke and people who are overweight. Eustachian tube dysfunction may also be more likely to develop in children, especially children who have:  Certain birth defects of the mouth, such as cleft palate.  Large tonsils and adenoids.  What are the signs or symptoms? Symptoms of this condition may include:  A feeling of fullness in the ear.  Ear pain.  Clicking or popping noises in the ear.  Ringing in the ear.  Hearing loss.  Loss of  balance.  Symptoms may get worse when the air pressure around you changes, such as when you travel to an area of high elevation or fly on an airplane. How is this diagnosed? This condition may be diagnosed based on:  Your symptoms.  A physical exam of your ear, nose, and throat.  Tests, such as those that measure: ? The movement of your eardrum (tympanogram). ? Your hearing (audiometry).  How is this treated? Treatment depends on the cause and severity of your condition. If your symptoms are mild, you may be able to relieve your symptoms by moving air into ("popping") your ears. If you have symptoms of fluid in your ears, treatment may include:  Decongestants.  Antihistamines.  Nasal sprays or ear drops that contain medicines that reduce swelling (steroids).  In some cases, you may need to have a procedure to drain the fluid in your eardrum (myringotomy). In this procedure, a small tube is placed in the eardrum to:  Drain the fluid.  Restore the air in the middle ear space.  Follow these instructions at home:  Take over-the-counter and prescription medicines only as told by your health care provider.  Use techniques to help pop your ears as recommended by your health care provider. These may include: ? Chewing gum. ? Yawning. ? Frequent, forceful swallowing. ? Closing your mouth, holding your nose closed, and gently blowing as if you are trying to blow air out of your nose.  Do not do any of the following until your health care provider approves: ? Travel to high altitudes. ? Fly in airplanes. ? Work in a [employer] scheme manager or room. ? Scuba dive.  Keep your ears dry. Dry your ears completely after showering or bathing.  Do not smoke.  Keep all follow-up visits as told by your health care provider. This is important. Contact a health care provider if:  Your symptoms do not go away after treatment.  Your symptoms come back after treatment.  You are unable to pop  your ears.  You have: ? A fever. ? Pain in your ear. ? Pain in your head or neck. ? Fluid draining from your ear.  Your hearing suddenly changes.  You become very dizzy.  You lose your balance. This information is not intended to replace advice given to you by your health care provider. Make sure you discuss any questions you have with your health care provider. Document Released: 03/24/2015 Document Revised: 08/03/2015 Document Reviewed: 03/16/2014 Elsevier Interactive Patient Education  [employer].

## 2017-12-30 ENCOUNTER — Ambulatory Visit: Payer: BC Managed Care – PPO | Admitting: Family Medicine

## 2017-12-30 ENCOUNTER — Encounter: Payer: Self-pay | Admitting: Family Medicine

## 2017-12-30 VITALS — BP 134/82 | HR 98 | Temp 98.2°F | Wt 243.0 lb

## 2017-12-30 DIAGNOSIS — H6983 Other specified disorders of Eustachian tube, bilateral: Secondary | ICD-10-CM

## 2017-12-30 NOTE — Progress Notes (Signed)
Subjective:    Patient ID: Caroline Velez, female    DOB: 1956-02-27, 62 y.o.   MRN: [id number]  No chief complaint on file.   HPI Patient was seen today for ongoing concern.  Pt endorses continued decreased hearing/muffled sound in both ears with occasional ringing in left ear.  Pt has tried Flonase, holding her nose and blowing, and claritin with no relief.  Pt notes it is hard to wear her ear piece at work because she cannot hear.  Pt notes her other symptoms improved after being treated for sinusitis on 12/17/2017 with amoxicillin.  Past Medical History:  Diagnosis Date  . Allergy   . Diabetes mellitus without complication ([place])   . Hyperlipidemia   . 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     No Known Allergies  ROS General: Denies fever, chills, night sweats, changes in weight, changes in appetite HEENT: Denies headaches, ear pain, changes in vision, rhinorrhea, sore throat  +decreased hearing CV: Denies CP, palpitations, SOB, orthopnea Pulm: Denies SOB, cough, wheezing GI: Denies abdominal pain, nausea, vomiting, diarrhea, constipation GU: Denies dysuria, hematuria, frequency, vaginal discharge Msk: Denies muscle cramps, joint pains Neuro: Denies weakness, numbness, tingling Skin: Denies rashes, bruising Psych: Denies depression, anxiety, hallucinations     Objective:    Blood pressure 134/82, pulse 98, temperature 98.2 F (36.8 C), temperature source Oral, weight 243 lb (110.2 kg), SpO2 98 %.  Decreased hearing in L ear, no tones heard.  R ear all tones heard.  Gen. Pleasant, well-nourished, in no distress, normal affect   HEENT: [place]/AT, face symmetric, no scleral icterus, PERRLA, nares patent without drainage, pharynx without erythema or exudate.  TMs full b/l.  No cervical lymphadenopathy. Lungs: no accessory muscle use Cardiovascular: RRR, no peripheral edema  Wt Readings from Last 3 Encounters:  12/30/17 243 lb (110.2 kg)  12/17/17 236 lb (107 kg)   01/22/17 235 lb 11.2 oz (106.9 kg)    Lab Results  Component Value Date   WBC 5.7 01/22/2017   HGB 12.2 01/22/2017   HCT 37.8 01/22/2017   PLT 327.0 01/22/2017   GLUCOSE 105 (H) 06/04/2017   CHOL 265 (H) 01/22/2017   TRIG 114.0 01/22/2017   HDL 41.00 01/22/2017   LDLCALC 201 (H) 01/22/2017   ALT 14 01/22/2017   AST 16 01/22/2017   NA 139 06/04/2017   K 4.3 06/04/2017   CL 105 06/04/2017   CREATININE 0.79 06/04/2017   BUN 15 06/04/2017   CO2 28 06/04/2017   TSH 0.646 01/06/2014   INR 1.07 05/02/2009   HGBA1C 6.4 01/22/2017    Assessment/Plan:  Dysfunction of both eustachian tubes -continue flonase -discussed using Zyrtec instead of claritin -given handout -f/u prn in 1 wk for continued symptoms  Grier Mitts, MD

## 2017-12-30 NOTE — Patient Instructions (Signed)
Eustachian Tube Dysfunction The eustachian tube connects the middle ear to the back of the nose. It regulates air pressure in the middle ear by allowing air to move between the ear and nose. It also helps to drain fluid from the middle ear space. When the eustachian tube does not function properly, air pressure, fluid, or both can build up in the middle ear. Eustachian tube dysfunction can affect one or both ears. What are the causes? This condition happens when the eustachian tube becomes blocked or cannot open normally. This may result from:  Ear infections.  Colds and other upper respiratory infections.  Allergies.  Irritation, such as from cigarette smoke or acid from the stomach coming up into the esophagus (gastroesophageal reflux).  Sudden changes in air pressure, such as from descending in an airplane.  Abnormal growths in the nose or throat, such as nasal polyps, tumors, or enlarged tissue at the back of the throat (adenoids).  What increases the risk? This condition may be more likely to develop in people who smoke and people who are overweight. Eustachian tube dysfunction may also be more likely to develop in children, especially children who have:  Certain birth defects of the mouth, such as cleft palate.  Large tonsils and adenoids.  What are the signs or symptoms? Symptoms of this condition may include:  A feeling of fullness in the ear.  Ear pain.  Clicking or popping noises in the ear.  Ringing in the ear.  Hearing loss.  Loss of balance.  Symptoms may get worse when the air pressure around you changes, such as when you travel to an area of high elevation or fly on an airplane. How is this diagnosed? This condition may be diagnosed based on:  Your symptoms.  A physical exam of your ear, nose, and throat.  Tests, such as those that measure: ? The movement of your eardrum (tympanogram). ? Your hearing (audiometry).  How is this treated? Treatment  depends on the cause and severity of your condition. If your symptoms are mild, you may be able to relieve your symptoms by moving air into ("popping") your ears. If you have symptoms of fluid in your ears, treatment may include:  Decongestants.  Antihistamines.  Nasal sprays or ear drops that contain medicines that reduce swelling (steroids).  In some cases, you may need to have a procedure to drain the fluid in your eardrum (myringotomy). In this procedure, a small tube is placed in the eardrum to:  Drain the fluid.  Restore the air in the middle ear space.  Follow these instructions at home:  Take over-the-counter and prescription medicines only as told by your health care provider.  Use techniques to help pop your ears as recommended by your health care provider. These may include: ? Chewing gum. ? Yawning. ? Frequent, forceful swallowing. ? Closing your mouth, holding your nose closed, and gently blowing as if you are trying to blow air out of your nose.  Do not do any of the following until your health care provider approves: ? Travel to high altitudes. ? Fly in airplanes. ? Work in a pressurized cabin or room. ? Scuba dive.  Keep your ears dry. Dry your ears completely after showering or bathing.  Do not smoke.  Keep all follow-up visits as told by your health care provider. This is important. Contact a health care provider if:  Your symptoms do not go away after treatment.  Your symptoms come back after treatment.  You are   unable to pop your ears.  You have: ? A fever. ? Pain in your ear. ? Pain in your head or neck. ? Fluid draining from your ear.  Your hearing suddenly changes.  You become very dizzy.  You lose your balance. This information is not intended to replace advice given to you by your health care provider. Make sure you discuss any questions you have with your health care provider. Document Released: 03/24/2015 Document Revised: 08/03/2015  Document Reviewed: 03/16/2014 Elsevier Interactive Patient Education  2018 [employer]  

## 2018-01-07 ENCOUNTER — Ambulatory Visit: Payer: BC Managed Care – PPO | Admitting: Family Medicine

## 2018-01-07 ENCOUNTER — Encounter: Payer: Self-pay | Admitting: Family Medicine

## 2018-01-07 VITALS — BP 110/60 | HR 68 | Temp 98.1°F | Wt 239.0 lb

## 2018-01-07 DIAGNOSIS — H9193 Unspecified hearing loss, bilateral: Secondary | ICD-10-CM

## 2018-01-07 DIAGNOSIS — H6983 Other specified disorders of Eustachian tube, bilateral: Secondary | ICD-10-CM | POA: Diagnosis not present

## 2018-01-07 NOTE — Patient Instructions (Signed)
Tinnitus Tinnitus refers to hearing a sound when there is no actual source for that sound. This is often described as ringing in the ears. However, people with this condition may hear a variety of noises. A person may hear the sound in one ear or in both ears. The sounds of tinnitus can be soft, loud, or somewhere in between. Tinnitus can last for a few seconds or can be constant for days. It may go away without treatment and come back at various times. When tinnitus is constant or happens often, it can lead to other problems, such as trouble sleeping and trouble concentrating. Almost everyone experiences tinnitus at some point. Tinnitus that is long-lasting (chronic) or comes back often is a problem that may require medical attention. What are the causes? The cause of tinnitus is often not known. In some cases, it can result from other problems or conditions, including:  Exposure to loud noises from machinery, music, or other sources.  Hearing loss.  Ear or sinus infections.  Earwax buildup.  A foreign object in the ear.  Use of certain medicines.  Use of alcohol and caffeine.  High blood pressure.  Heart diseases.  Anemia.  Allergies.  Meniere disease.  Thyroid problems.  Tumors.  An enlarged part of a weakened blood vessel (aneurysm).  What are the signs or symptoms? The main symptom of tinnitus is hearing a sound when there is no source for that sound. It may sound like:  Buzzing.  Roaring.  Ringing.  Blowing air, similar to the sound heard when you listen to a seashell.  Hissing.  Whistling.  Sizzling.  Humming.  Running water.  A sustained musical note.  How is this diagnosed? Tinnitus is diagnosed based on your symptoms. Your health care provider will do a physical exam. A comprehensive hearing exam (audiologic exam) will be done if your tinnitus:  Affects only one ear (unilateral).  Causes hearing difficulties.  Lasts 6 months or  longer.  You may also need to see a health care provider who specializes in hearing disorders (audiologist). You may be asked to complete a questionnaire to determine the severity of your tinnitus. Tests may be done to help determine the cause and to rule out other conditions. These can include:  Imaging studies of your head and brain, such as: ? A CT scan. ? An MRI.  An imaging study of your blood vessels (angiogram).  How is this treated? Treating an underlying medical condition can sometimes make tinnitus go away. If your tinnitus continues, other treatments may include:  Medicines, such as certain antidepressants or sleeping aids.  Sound generators to mask the tinnitus. These include: ? Tabletop sound machines that play relaxing sounds to help you fall asleep. ? Wearable devices that fit in your ear and play sounds or music. ? A small device that uses headphones to deliver a signal embedded in music (acoustic neural stimulation). In time, this may change the pathways of your brain and make you less sensitive to tinnitus. This device is used for very severe cases when no other treatment is working.  Therapy and counseling to help you manage the stress of living with tinnitus.  Using hearing aids or cochlear implants, if your tinnitus is related to hearing loss.  Follow these instructions at home:  When possible, avoid being in loud places and being exposed to loud sounds.  Wear hearing protection, such as earplugs, when you are exposed to loud noises.  Do not take stimulants, such as nicotine,   alcohol, or caffeine.  Practice techniques for reducing stress, such as meditation, yoga, or deep breathing.  Use a white noise machine, a humidifier, or other devices to mask the sound of tinnitus.  Sleep with your head slightly raised. This may reduce the impact of tinnitus.  Try to get plenty of rest each night. Contact a health care provider if:  You have tinnitus in just one  ear.  Your tinnitus continues for 3 weeks or longer without stopping.  Home care measures are not helping.  You have tinnitus after a head injury.  You have tinnitus along with any of the following: ? Dizziness. ? Loss of balance. ? Nausea and vomiting. This information is not intended to replace advice given to you by your health care provider. Make sure you discuss any questions you have with your health care provider. Document Released: 02/25/2005 Document Revised: 10/29/2015 Document Reviewed: 07/28/2013 Elsevier Interactive Patient Education  2018 [employer] Inc[employer]  

## 2018-01-07 NOTE — Progress Notes (Signed)
Subjective:    Patient ID: Caroline Velez, female    DOB: 1955-09-18, 62 y.o.   MRN: [id number]  No chief complaint on file.   HPI Patient was seen today for continued hearing concerns.  Pt endorses continued muffled/decreased hearing and tinnitus.  Pt tried Flonase, holding her nose and blowing, Claritin-D, and Zyrtec.  Symptoms were first noted the beginning of October after an episode of sinusitis, treated with amoxicillin.  Pt took a advil migraine to help her fall asleep as the ringing in her ears kept her awake.   Past Medical History:  Diagnosis Date  . Allergy   . Diabetes mellitus without complication ([place])   . Hyperlipidemia   . 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     No Known Allergies  ROS General: Denies fever, chills, night sweats, changes in weight, changes in appetite HEENT: Denies headaches, ear pain, changes in vision, rhinorrhea, sore throat  + tinnitus, decreased hearing, muffled hearing CV: Denies CP, palpitations, SOB, orthopnea Pulm: Denies SOB, cough, wheezing GI: Denies abdominal pain, nausea, vomiting, diarrhea, constipation GU: Denies dysuria, hematuria, frequency, vaginal discharge Msk: Denies muscle cramps, joint pains Neuro: Denies weakness, numbness, tingling Skin: Denies rashes, bruising Psych: Denies depression, anxiety, hallucinations    Objective:    Blood pressure 110/60, pulse 68, temperature 98.1 F (36.7 C), temperature source Oral, weight 239 lb (108.4 kg), SpO2 98 %.   Gen. Pleasant, well-nourished, in no distress, normal affect   HEENT: [place]/AT, face symmetric, no scleral icterus, PERRLA, nares patent without drainage Lungs: no accessory muscle use Cardiovascular: RRR, no peripheral edema Neuro:  A&Ox3, CN II-XII intact, normal gait.  Negative Weber, Rhine test inconclusive as pt could not hear the tuning fork on her mastoid or in air when brought near her ear.  Pt did hear tuning fork when directly next to R ear. Skin:   Warm, no lesions/ rash   Wt Readings from Last 3 Encounters:  01/07/18 239 lb (108.4 kg)  12/30/17 243 lb (110.2 kg)  12/17/17 236 lb (107 kg)    Lab Results  Component Value Date   WBC 5.7 01/22/2017   HGB 12.2 01/22/2017   HCT 37.8 01/22/2017   PLT 327.0 01/22/2017   GLUCOSE 105 (H) 06/04/2017   CHOL 265 (H) 01/22/2017   TRIG 114.0 01/22/2017   HDL 41.00 01/22/2017   LDLCALC 201 (H) 01/22/2017   ALT 14 01/22/2017   AST 16 01/22/2017   NA 139 06/04/2017   K 4.3 06/04/2017   CL 105 06/04/2017   CREATININE 0.79 06/04/2017   BUN 15 06/04/2017   CO2 28 06/04/2017   TSH 0.646 01/06/2014   INR 1.07 05/02/2009   HGBA1C 6.4 01/22/2017    Assessment/Plan:  Dysfunction of both eustachian tubes  -continue flonase and claritin - Plan: Ambulatory referral to ENT  Decreased hearing of both ears  - Plan: Ambulatory referral to ENT  F/u prn  Grier Mitts, MD

## 2018-04-06 ENCOUNTER — Other Ambulatory Visit: Payer: Self-pay | Admitting: Family Medicine

## 2018-04-06 DIAGNOSIS — E119 Type 2 diabetes mellitus without complications: Secondary | ICD-10-CM

## 2018-04-09 NOTE — Telephone Encounter (Signed)
Patient states her pharmacy is saying they have not received this prescription that was sent on 04/07/18

## 2018-04-09 NOTE — Telephone Encounter (Signed)
Spoke with Beau Fanny, pharmacy technician and verified verbally that prescription for Metformin 500 mg tablet #180 with 0 refills per previous prescription sent to pharmacy on 04/07/18. Pharmacy states they did not receive prescription that was sent previously.

## 2018-04-09 NOTE — Telephone Encounter (Signed)
Spoke with pharmacy states that they received Rx for metformin

## 2018-07-15 ENCOUNTER — Encounter: Payer: Self-pay | Admitting: Family Medicine

## 2018-07-15 ENCOUNTER — Other Ambulatory Visit: Payer: Self-pay

## 2018-07-15 ENCOUNTER — Ambulatory Visit (INDEPENDENT_AMBULATORY_CARE_PROVIDER_SITE_OTHER): Payer: BC Managed Care – PPO | Admitting: Family Medicine

## 2018-07-15 DIAGNOSIS — G8929 Other chronic pain: Secondary | ICD-10-CM

## 2018-07-15 DIAGNOSIS — M25561 Pain in right knee: Secondary | ICD-10-CM

## 2018-07-15 NOTE — Progress Notes (Signed)
Virtual Visit via Telephone Note Unable to connect via Webex or Doxy.  I connected with Caroline Velez on 07/15/18 at  3:00 PM EDT by telephone and verified that I am speaking with the correct person using two identifiers.   I discussed the limitations, risks, security and privacy concerns of performing an evaluation and management service by telephone and the availability of in person appointments. I also discussed with the patient that there may be a patient responsible charge related to this service. The patient expressed understanding and agreed to proceed.  Location patient: home Location provider: work or home office Participants present for the call: patient, provider Patient did not have a visit in the prior 7 days to address this/these issue(s).   History of Present Illness: Pt states her R knee is "hurting so bad".  States has to hop on it as it hurts to walk on it.  Occurring for months.  Pt states she cannot lay on the leg at night as it hurts.  Pain is in the "joint part", 20+/10 pain when hurting.  Tried Aspercreme, Aleeve, hot shower.  Can tell when it is going to rain as knee pain happens.  Denies edema, erythema, injury, falls.   Observations/Objective: Patient sounds cheerful and well on the phone. I do not appreciate any SOB. Speech and thought processing are grossly intact. Patient reported vitals:  Assessment and Plan: Chronic pain of right knee  -likely 2/2 arthritis.  Gout less likely as knee without erythema or edema. -discussed trying Arthritis strength tylenol. -will obtain imaging tomorrow.  Consider Voltaren gel, steroid injections, referral to Ortho. - Plan: DG Knee Complete 4 Views Right   Follow Up Instructions: Will f/u after xray results available  I did not refer this patient for an OV in the next 24 hours for this/these issue(s).  I discussed the assessment and treatment plan with the patient. The patient was provided an opportunity to  ask questions and all were answered. The patient agreed with the plan and demonstrated an understanding of the instructions.   The patient was advised to call back or seek an in-person evaluation if the symptoms worsen or if the condition fails to improve as anticipated.  I provided 11 minutes of non-face-to-face time during this encounter.   Billie Ruddy, MD

## 2018-07-16 ENCOUNTER — Other Ambulatory Visit: Payer: Self-pay | Admitting: Family Medicine

## 2018-07-16 ENCOUNTER — Ambulatory Visit (INDEPENDENT_AMBULATORY_CARE_PROVIDER_SITE_OTHER): Payer: BC Managed Care – PPO

## 2018-07-16 DIAGNOSIS — G8929 Other chronic pain: Secondary | ICD-10-CM

## 2018-07-16 DIAGNOSIS — M1711 Unilateral primary osteoarthritis, right knee: Secondary | ICD-10-CM

## 2018-07-16 DIAGNOSIS — M25561 Pain in right knee: Secondary | ICD-10-CM

## 2018-07-16 MED ORDER — DICLOFENAC SODIUM 1 % TD GEL: 4.0000 g | Freq: Four times a day (QID) | TRANSDERMAL | 1 refills | Status: DC | PRN

## 2018-07-17 ENCOUNTER — Telehealth: Payer: Self-pay

## 2018-07-17 NOTE — Telephone Encounter (Signed)
PA for diclofenac sodium (VOLTAREN) 1 % GEL has been sent to cover my meds.  KeyLance Bosch - PA Case ID: [id number]

## 2018-07-17 NOTE — Telephone Encounter (Signed)
PA has been denied. Denial letter has been placed in Dr. Volanda Napoleon folder. Please return to [facility] with the course of action once reviewed.

## 2018-07-20 ENCOUNTER — Other Ambulatory Visit: Payer: Self-pay | Admitting: Family Medicine

## 2018-07-20 DIAGNOSIS — E119 Type 2 diabetes mellitus without complications: Secondary | ICD-10-CM

## 2018-07-22 NOTE — Telephone Encounter (Signed)
Patient called to check the status of her medication refill.  Patient stated she has been without the medication for three days now.

## 2018-07-22 NOTE — Telephone Encounter (Signed)
Letter reviewed. Will contact pt to discuss.

## 2018-11-19 ENCOUNTER — Other Ambulatory Visit: Payer: Self-pay | Admitting: Family Medicine

## 2018-11-19 DIAGNOSIS — E119 Type 2 diabetes mellitus without complications: Secondary | ICD-10-CM

## 2018-11-19 NOTE — Telephone Encounter (Signed)
Requested medication (s) are due for refill today: yes  Requested medication (s) are on the active medication list: yes  Last refill:  07/20/2018  Future visit scheduled: no  Notes to clinic:  Review for refill  Requested Prescriptions  Pending Prescriptions Disp Refills   metFORMIN (GLUCOPHAGE) 500 MG tablet 180 tablet 0    Sig: Take 1 tablet (500 mg total) by mouth 2 (two) times daily with a meal.     Endocrinology:  Diabetes - Biguanides Failed - 11/19/2018 12:53 PM      Failed - Cr in normal range and within 360 days    Creatinine, Ser  Date Value Ref Range Status  06/04/2017 0.79 0.40 - 1.20 mg/dL Final         Failed - HBA1C is between 0 and 7.9 and within 180 days    Hgb A1c MFr Bld  Date Value Ref Range Status  01/22/2017 6.4 4.6 - 6.5 % Final    Comment:    Glycemic Control Guidelines for People with Diabetes:Non Diabetic:  <6%Goal of Therapy: <7%Additional Action Suggested:  >8%          Failed - eGFR in normal range and within 360 days    GFR calc Af Amer  Date Value Ref Range Status  01/07/2014 88 (L) >90 mL/min Final    Comment:    (NOTE) The eGFR has been calculated using the CKD EPI equation. This calculation has not been validated in all clinical situations. eGFR's persistently <90 mL/min signify possible Chronic Kidney Disease.   GFR calc non Af Amer  Date Value Ref Range Status  01/07/2014 76 (L) >90 mL/min Final   GFR  Date Value Ref Range Status  06/04/2017 95.12 >60.00 mL/min Final         Passed - Valid encounter within last 6 months    Recent Outpatient Visits          4 months ago Chronic pain of right knee   [facility] [place], [place]   10 months ago Dysfunction of both eustachian tubes   [facility] [place], [place]   10 months ago Dysfunction of both eustachian tubes   [facility] [place], [place]   11 months ago Acute maxillary sinusitis, recurrence not  specified   [facility], Langley Adie, MD   1 year ago Chest tightness   [facility], Langley Adie, MD

## 2018-11-19 NOTE — Telephone Encounter (Signed)
Copied from [place] [id number]. Topic: Quick Communication - Rx Refill/Question >> Nov 19, 2018 12:49 PM Caroline Velez, [place] A wrote: Medication: metFORMIN (GLUCOPHAGE) 500 MG tablet  Has the patient contacted their pharmacy?Yes (Agent: If no, request that the patient contact the pharmacy for the refill.) (Agent: If yes, when and what did the pharmacy advise?)Contact PCP  Preferred Pharmacy (with phone number or street name): [place], [place] - [address] [phone] (Phone) [phone] (Fax)    Agent: Please be advised that RX refills may take up to 3 business days. We ask that you follow-up with your pharmacy.

## 2018-11-20 ENCOUNTER — Other Ambulatory Visit: Payer: Self-pay | Admitting: Family Medicine

## 2018-11-20 DIAGNOSIS — E119 Type 2 diabetes mellitus without complications: Secondary | ICD-10-CM

## 2019-02-15 ENCOUNTER — Telehealth (INDEPENDENT_AMBULATORY_CARE_PROVIDER_SITE_OTHER): Payer: BC Managed Care – PPO | Admitting: Family Medicine

## 2019-02-15 DIAGNOSIS — E119 Type 2 diabetes mellitus without complications: Secondary | ICD-10-CM

## 2019-02-15 DIAGNOSIS — M25531 Pain in right wrist: Secondary | ICD-10-CM | POA: Diagnosis not present

## 2019-02-15 DIAGNOSIS — E782 Mixed hyperlipidemia: Secondary | ICD-10-CM | POA: Diagnosis not present

## 2019-02-15 NOTE — Progress Notes (Signed)
Virtual Visit via Video Note  I connected with Fusako Espejo on 02/15/19 at  8:00 AM EST by a video enabled telemedicine application 2/2 [id number] pandemic and verified that I am speaking with the correct person using two identifiers.  Location patient: home Location provider:work or home office Persons participating in the virtual visit: patient, provider  I discussed the limitations of evaluation and management by telemedicine and the availability of in person appointments. The patient expressed understanding and agreed to proceed.   HPI: Pt is a 63 yo female with pmh sig for HTN, DM II, chronic low back pain, h/o meningitis, and seasonal allergies.  Pt having pain in R wrist x several wks.  Pt concerned about arthritis as she her wrist hurts more when it is going to rain.  The cold air from a fan at night (for hot flashes) makes wrist worse.  Pt used voltaren gel, aleeve, icy hot patches.  Aspercreme works better.  Pt endorses mild edema, difficulty doing certain activities as she is R handed.  At times the pain goes up her arm into her shoulder.  Pt has not been taking Metformin regularly.  May take once a day instead of twice day.  She has been eating popcorn, ice cream, and sweet potatoes for most meals.  Pt states it is easier to grab quick snacks than to cook for one person.    Pt is adamant that she does not want any injections/shots of any kind.  Pt reluctantly got an influenza vaccine in Nov.  ROS: See pertinent positives and negatives per HPI.  Past Medical History:  Diagnosis Date  . Allergy   . Diabetes mellitus without complication ([place])   . Hyperlipidemia   . 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 BREAST SURGERY     bilateral  . PARTIAL HYSTERECTOMY      Family History  Problem Relation Age of Onset  . Cancer Mother        breast  . Hypertension Father     Current Outpatient Medications:  .   aspirin-acetaminophen-caffeine (EXCEDRIN MIGRAINE) 250-250-65 MG per tablet, Take 1 tablet by mouth every 6 (six) hours as needed for pain., Disp: , Rfl:  .  diclofenac sodium (VOLTAREN) 1 % GEL, Apply 4 g topically 4 (four) times daily as needed (for right knee pain)., Disp: 100 g, Rfl: 1 .  metFORMIN (GLUCOPHAGE) 500 MG tablet, TAKE 1 TABLET BY MOUTH TWICE DAILY WITH MEALS, Disp: 180 tablet, Rfl: 0  EXAM:  VITALS per patient if applicable: RR between 123456 bpm  GENERAL: alert, oriented, appears well and in no acute distress  HEENT: atraumatic, conjunctiva clear, no obvious abnormalities on inspection of external nose and ears  NECK: normal movements of the head and neck  LUNGS: on inspection no signs of respiratory distress, breathing rate appears normal, no obvious gross SOB, gasping or wheezing  CV: no obvious cyanosis  MS: moves all visible extremities without noticeable abnormality  PSYCH/NEURO: pleasant and cooperative, no obvious depression or anxiety, speech and thought processing grossly intact  ASSESSMENT AND PLAN:  Discussed the following assessment and plan:  Right wrist pain  -discussed possible causes including arthritis, gout, carpal tunnel, fx, etc. -likely arthritis give hx -will obtain imaging -will obtain Uric acid level to r/o gout. -discussed supportive care for now.  Aspercreme, aleeve prn -discussed possible short course of prednisone, however advised will elevate fsbs.  Will wait on this  for now. -consider referral to Ortho based on imaging. - Plan: DG Wrist Complete Right, CBC (no diff), Uric Acid  Controlled type 2 diabetes mellitus without complication, without long-term current use of insulin (HCC)  -last hgb A1C was 6.4% on 01/22/17 -discussed lifestyle modifications -pt advised to decrease carb intake and increase intake of vegetables. -continue metformin 500 mg BID - Plan: Hemoglobin 123456, Basic Metabolic Panel, Microalbumin/Creatinine Ratio,  Urine  Mixed hyperlipidemia  - Plan: Lipid Panel  F/u prn in the next few wks   I discussed the assessment and treatment plan with the patient. The patient was provided an opportunity to ask questions and all were answered. The patient agreed with the plan and demonstrated an understanding of the instructions.   The patient was advised to call back or seek an in-person evaluation if the symptoms worsen or if the condition fails to improve as anticipated.   Billie Ruddy, MD

## 2019-02-16 ENCOUNTER — Other Ambulatory Visit: Payer: Self-pay

## 2019-02-17 ENCOUNTER — Other Ambulatory Visit (INDEPENDENT_AMBULATORY_CARE_PROVIDER_SITE_OTHER): Payer: BC Managed Care – PPO

## 2019-02-17 ENCOUNTER — Ambulatory Visit (INDEPENDENT_AMBULATORY_CARE_PROVIDER_SITE_OTHER): Payer: BC Managed Care – PPO

## 2019-02-17 DIAGNOSIS — E119 Type 2 diabetes mellitus without complications: Secondary | ICD-10-CM

## 2019-02-17 DIAGNOSIS — M25531 Pain in right wrist: Secondary | ICD-10-CM

## 2019-02-17 DIAGNOSIS — E782 Mixed hyperlipidemia: Secondary | ICD-10-CM | POA: Diagnosis not present

## 2019-02-17 LAB — BASIC METABOLIC PANEL
BUN: 18 mg/dL (ref 6–23)
CO2: 28 mEq/L (ref 19–32)
Calcium: 9.1 mg/dL (ref 8.4–10.5)
Chloride: 105 mEq/L (ref 96–112)
Creatinine, Ser: 0.89 mg/dL (ref 0.40–1.20)
GFR: 77.56 mL/min (ref >60.00)
Glucose, Bld: 106 mg/dL — ABNORMAL HIGH (ref 70–99)
Potassium: 4.2 mEq/L (ref 3.5–5.1)
Sodium: 139 mEq/L (ref 135–145)

## 2019-02-17 LAB — CBC
HCT: 36.9 % (ref 36.0–46.0)
Hemoglobin: 12 g/dL (ref 12.0–15.0)
MCHC: 32.4 g/dL (ref 30.0–36.0)
MCV: 87.9 fl (ref 78.0–100.0)
Platelets: 302 10*3/uL (ref 150.0–400.0)
RBC: 4.2 Mil/uL (ref 3.87–5.11)
RDW: 14.7 % (ref 11.5–15.5)
WBC: 6 10*3/uL (ref 4.0–10.5)

## 2019-02-17 LAB — MICROALBUMIN / CREATININE URINE RATIO
Creatinine,U: 160.7 mg/dL
Microalb Creat Ratio: 0.4 mg/g (ref 0.0–30.0)
Microalb, Ur: 0.7 mg/dL (ref 0.0–1.9)

## 2019-02-17 LAB — LIPID PANEL
Cholesterol: 255 mg/dL — ABNORMAL HIGH (ref 0–200)
HDL: 47.3 mg/dL (ref >39.00)
LDL Cholesterol: 189 mg/dL — ABNORMAL HIGH (ref 0–99)
NonHDL: 207.66
Total CHOL/HDL Ratio: 5
Triglycerides: 93 mg/dL (ref 0.0–149.0)
VLDL: 18.6 mg/dL (ref 0.0–40.0)

## 2019-02-17 LAB — URIC ACID: Uric Acid, Serum: 5 mg/dL (ref 2.4–7.0)

## 2019-02-17 LAB — HEMOGLOBIN A1C: Hgb A1c MFr Bld: 6.5 % (ref 4.6–6.5)

## 2019-02-22 ENCOUNTER — Telehealth: Payer: Self-pay

## 2019-02-22 NOTE — Telephone Encounter (Signed)
Spoke with pt verbalized understanding of her lab results and Dr Volanda Napoleon recommendations, pt states that she will call back to schedule lab visit

## 2019-02-22 NOTE — Telephone Encounter (Signed)
Copied from [place] [id number]. Topic: General - Inquiry >> Feb 22, 2019  9:10 AM Percell Belt A wrote: Reason for CRM: pt called and would like nancy to to give her a call back about her lab results  Best number  [phone]

## 2019-02-24 ENCOUNTER — Other Ambulatory Visit: Payer: Self-pay

## 2019-02-24 ENCOUNTER — Ambulatory Visit: Payer: BC Managed Care – PPO | Attending: Internal Medicine

## 2019-02-24 DIAGNOSIS — Z20822 Contact with and (suspected) exposure to covid-19: Secondary | ICD-10-CM

## 2019-02-25 ENCOUNTER — Telehealth: Payer: Self-pay

## 2019-02-25 LAB — NOVEL CORONAVIRUS, NAA: SARS-CoV-2, NAA: NOT DETECTED

## 2019-02-25 NOTE — Telephone Encounter (Signed)
Caller advise result not back yet 

## 2019-02-26 ENCOUNTER — Telehealth: Payer: Self-pay | Admitting: Family Medicine

## 2019-02-26 NOTE — Telephone Encounter (Signed)
Negative COVID results given. Patient results "NOT Detected." Caller expressed understanding. ° °

## 2019-06-16 ENCOUNTER — Other Ambulatory Visit: Payer: Self-pay

## 2019-06-17 ENCOUNTER — Encounter: Payer: Self-pay | Admitting: Family Medicine

## 2019-06-17 ENCOUNTER — Ambulatory Visit (INDEPENDENT_AMBULATORY_CARE_PROVIDER_SITE_OTHER): Payer: BC Managed Care – PPO | Admitting: Family Medicine

## 2019-06-17 ENCOUNTER — Other Ambulatory Visit: Payer: Self-pay

## 2019-06-17 VITALS — BP 120/80 | HR 72 | Temp 97.8°F | Wt 246.0 lb

## 2019-06-17 DIAGNOSIS — E119 Type 2 diabetes mellitus without complications: Secondary | ICD-10-CM | POA: Diagnosis not present

## 2019-06-17 DIAGNOSIS — R0789 Other chest pain: Secondary | ICD-10-CM

## 2019-06-17 DIAGNOSIS — R232 Flushing: Secondary | ICD-10-CM

## 2019-06-17 MED ORDER — BLOOD GLUCOSE METER KIT: PACK | 0 refills | Status: DC

## 2019-06-17 NOTE — Patient Instructions (Addendum)
You can try [facility] for your hot flashes.  It can be found over-the-counter at your local drugstore in the vitamin section. Angina  Angina is very bad discomfort or pain in the chest, neck, arm, jaw, or back. The discomfort is caused by a lack of blood in the middle layer of the heart wall (myocardium). What are the causes? This condition is caused by a buildup of fat and cholesterol (plaque) in your arteries (atherosclerosis). This buildup narrows the arteries and makes it hard for blood to flow. What increases the risk? You are more likely to develop this condition if:  You have high levels of cholesterol in your blood.  You have high blood pressure (hypertension).  You have diabetes.  You have a family history of heart disease.  You are not active, or you do not exercise enough.  You feel sad (depressed).  You have been treated with high energy rays (radiation) on the left side of your chest. Other risk factors are:  Using tobacco.  Being very overweight (obese).  Eating a diet high in unhealthy fats (saturated fats).  Having stress, or being exposed to things that cause stress.  Using drugs, such as cocaine. Women have a greater risk for angina if:  They are older than 4.  They have stopped having their period (are in postmenopause). What are the signs or symptoms? Common symptoms of this condition in both men and women may include:  Chest pain, which may: ? Feel like a crushing or squeezing in the chest. ? Feel like a tightness, pressure, fullness, or heaviness in the chest. ? Last for more than a few minutes at a time. ? Stop and come back (recur) after a few minutes.  Pain in the neck, arm, jaw, or back.  Heartburn or upset stomach (indigestion) for no reason.  Being short of breath.  Feeling sick to your stomach (nauseous).  Sudden cold sweats. Women and people with diabetes may have other symptoms that are not usual, such as feeling:  Tired  (fatigue).  Worried or nervous (anxious) for no reason.  Weak for no reason.  Dizzy or passing out (fainting). How is this treated? This condition may be treated with:  Medicines. These are given to: ? Prevent blood clots. ? Prevent heart attack. ? Relax blood vessels and improve blood flow to the heart (nitrates). ? Reduce blood pressure. ? Improve the pumping action of the heart. ? Reduce fat and cholesterol in the blood.  A procedure to widen a narrowed or blocked artery in the heart (angioplasty).  Surgery to allow blood to go around a blocked artery (coronary artery bypass surgery). Follow these instructions at home: Medicines  Take over-the-counter and prescription medicines only as told by your doctor.  Do not take these medicines unless your doctor says that you can: ? NSAIDs. These include:  Ibuprofen.  Naproxen. ? Vitamin supplements that have vitamin A, vitamin E, or both. ? Hormone therapy that contains estrogen with or without progestin. Eating and drinking   Eat a heart-healthy diet that includes: ? Lots of fresh fruits and vegetables. ? Whole grains. ? Low-fat (lean) protein. ? Low-fat dairy products.  Follow instructions from your doctor about what you cannot eat or drink. Activity  Follow an exercise program that your doctor tells you.  Talk with your doctor about joining a program to help improve the health of your heart (cardiac rehab).  When you feel tired, take a break. Plan breaks if you know you  are going to feel tired. Lifestyle   Do not use any products that contain nicotine or tobacco. This includes cigarettes, e-cigarettes, and chewing tobacco. If you need help quitting, ask your doctor.  If your doctor says you can drink alcohol: ? Limit how much you use to:  0-1 drink a day for women who are not pregnant.  0-2 drinks a day for men. ? Be aware of how much alcohol is in your drink. In the [place] one drink equals:  One 12 oz  bottle of beer (355 mL).  One 5 oz glass of wine (148 mL).  One 1 oz glass of hard liquor (44 mL). General instructions  Stay at a healthy weight. If your doctor tells you to do so, work with him or her to lose weight.  Learn to deal with stress. If you need help, ask your doctor.  Keep your vaccines up to date. Get a flu shot every year.  Talk with your doctor if you feel sad. Take a screening test to see if you are at risk for depression.  Work with your doctor to manage any other health problems that you have. These may include diabetes or high blood pressure.  Keep all follow-up visits as told by your doctor. This is important. Get help right away if:  You have pain in your chest, neck, arm, jaw, or back, and the pain: ? Lasts more than a few minutes. ? Comes back. ? Does not get better after you take medicine under your tongue (sublingual nitroglycerin). ? Keeps getting worse. ? Comes more often.  You have any of these problems for no reason: ? Sweating a lot. ? Heartburn or upset stomach. ? Shortness of breath. ? Trouble breathing. ? Feeling sick to your stomach. ? Throwing up (vomiting). ? Feeling more tired than normal. ? Feeling nervous or worrying more than normal. ? Weakness.  You are suddenly dizzy or light-headed.  You pass out. These symptoms may be an emergency. Do not wait to see if the symptoms will go away. Get medical help right away. Call your local emergency services (911 in the [place] Do not drive yourself to the hospital. Summary  Angina is very bad discomfort or pain in the chest, neck, arm, neck, or back.  Symptoms include chest pain, heartburn or upset stomach for no reason, and shortness of breath.  Women or people with diabetes may have symptoms that are not usual, such as feeling nervous or worried for no reason, weak for no reason, or tired.  Take all medicines only as told by your doctor.  You should eat a heart-healthy diet and  follow an exercise program. This information is not intended to replace advice given to you by your health care provider. Make sure you discuss any questions you have with your health care provider. Document Revised: 10/13/2017 Document Reviewed: 10/13/2017 Elsevier Patient Education  [place].  Diabetes Mellitus and Exercise Exercising regularly is important for your overall health, especially when you have diabetes (diabetes mellitus). Exercising is not only about losing weight. It has many other health benefits, such as increasing muscle strength and bone density and reducing body fat and stress. This leads to improved fitness, flexibility, and endurance, all of which result in better overall health. Exercise has additional benefits for people with diabetes, including:  Reducing appetite.  Helping to lower and control blood glucose.  Lowering blood pressure.  Helping to control amounts of fatty substances (lipids) in the blood, such as  cholesterol and triglycerides.  Helping the body to respond better to insulin (improving insulin sensitivity).  Reducing how much insulin the body needs.  Decreasing the risk for heart disease by: ? Lowering cholesterol and triglyceride levels. ? Increasing the levels of good cholesterol. ? Lowering blood glucose levels. What is my activity plan? Your health care provider or certified diabetes educator can help you make a plan for the type and frequency of exercise (activity plan) that works for you. Make sure that you:  Do at least 150 minutes of moderate-intensity or vigorous-intensity exercise each week. This could be brisk walking, biking, or water aerobics. ? Do stretching and strength exercises, such as yoga or weightlifting, at least 2 times a week. ? Spread out your activity over at least 3 days of the week.  Get some form of physical activity every day. ? Do not go more than 2 days in a row without some kind of physical  activity. ? Avoid being inactive for more than 30 minutes at a time. Take frequent breaks to walk or stretch.  Choose a type of exercise or activity that you enjoy, and set realistic goals.  Start slowly, and gradually increase the intensity of your exercise over time. What do I need to know about managing my diabetes?   Check your blood glucose before and after exercising. ? If your blood glucose is 240 mg/dL (13.3 mmol/L) or higher before you exercise, check your urine for ketones. If you have ketones in your urine, do not exercise until your blood glucose returns to normal. ? If your blood glucose is 100 mg/dL (5.6 mmol/L) or lower, eat a snack containing 15-20 grams of carbohydrate. Check your blood glucose 15 minutes after the snack to make sure that your level is above 100 mg/dL (5.6 mmol/L) before you start your exercise.  Know the symptoms of low blood glucose (hypoglycemia) and how to treat it. Your risk for hypoglycemia increases during and after exercise. Common symptoms of hypoglycemia can include: ? Hunger. ? Anxiety. ? Sweating and feeling clammy. ? Confusion. ? Dizziness or feeling light-headed. ? Increased heart rate or palpitations. ? Blurry vision. ? Tingling or numbness around the mouth, lips, or tongue. ? Tremors or shakes. ? Irritability.  Keep a rapid-acting carbohydrate snack available before, during, and after exercise to help prevent or treat hypoglycemia.  Avoid injecting insulin into areas of the body that are going to be exercised. For example, avoid injecting insulin into: ? The arms, when playing tennis. ? The legs, when jogging.  Keep records of your exercise habits. Doing this can help you and your health care provider adjust your diabetes management plan as needed. Write down: ? Food that you eat before and after you exercise. ? Blood glucose levels before and after you exercise. ? The type and amount of exercise you have done. ? When your insulin  is expected to peak, if you use insulin. Avoid exercising at times when your insulin is peaking.  When you start a new exercise or activity, work with your health care provider to make sure the activity is safe for you, and to adjust your insulin, medicines, or food intake as needed.  Drink plenty of water while you exercise to prevent dehydration or heat stroke. Drink enough fluid to keep your urine clear or pale yellow. Summary  Exercising regularly is important for your overall health, especially when you have diabetes (diabetes mellitus).  Exercising has many health benefits, such as increasing muscle strength and  bone density and reducing body fat and stress.  Your health care provider or certified diabetes educator can help you make a plan for the type and frequency of exercise (activity plan) that works for you.  When you start a new exercise or activity, work with your health care provider to make sure the activity is safe for you, and to adjust your insulin, medicines, or food intake as needed. This information is not intended to replace advice given to you by your health care provider. Make sure you discuss any questions you have with your health care provider. Document Revised: 09/19/2016 Document Reviewed: 08/07/2015 Elsevier Patient Education  2020 [place].  Menopause Menopause is the normal time of life when menstrual periods stop completely. It is usually confirmed by 12 months without a menstrual period. The transition to menopause (perimenopause) most often happens between the ages of 9 and 83. During perimenopause, hormone levels change in your body, which can cause symptoms and affect your health. Menopause may increase your risk for:  Loss of bone (osteoporosis), which causes bone breaks (fractures).  Depression.  Hardening and narrowing of the arteries (atherosclerosis), which can cause heart attacks and strokes. What are the causes? This condition is usually  caused by a natural change in hormone levels that happens as you get older. The condition may also be caused by surgery to remove both ovaries (bilateral oophorectomy). What increases the risk? This condition is more likely to start at an earlier age if you have certain medical conditions or treatments, including:  A tumor of the pituitary gland in the brain.  A disease that affects the ovaries and hormone production.  Radiation treatment for cancer.  Certain cancer treatments, such as chemotherapy or hormone (anti-estrogen) therapy.  Heavy smoking and excessive alcohol use.  Family history of early menopause. This condition is also more likely to develop earlier in women who are very thin. What are the signs or symptoms? Symptoms of this condition include:  Hot flashes.  Irregular menstrual periods.  Night sweats.  Changes in feelings about sex. This could be a decrease in sex drive or an increased comfort around your sexuality.  Vaginal dryness and thinning of the vaginal walls. This may cause painful intercourse.  Dryness of the skin and development of wrinkles.  Headaches.  Problems sleeping (insomnia).  Mood swings or irritability.  Memory problems.  Weight gain.  Hair growth on the face and chest.  Bladder infections or problems with urinating. How is this diagnosed? This condition is diagnosed based on your medical history, a physical exam, your age, your menstrual history, and your symptoms. Hormone tests may also be done. How is this treated? In some cases, no treatment is needed. You and your health care provider should make a decision together about whether treatment is necessary. Treatment will be based on your individual condition and preferences. Treatment for this condition focuses on managing symptoms. Treatment may include:  Menopausal hormone therapy (MHT).  Medicines to treat specific symptoms or complications.  Acupuncture.  Vitamin or herbal  supplements. Before starting treatment, make sure to let your health care provider know if you have a personal or family history of:  Heart disease.  Breast cancer.  Blood clots.  Diabetes.  Osteoporosis. Follow these instructions at home: Lifestyle  Do not use any products that contain nicotine or tobacco, such as cigarettes and e-cigarettes. If you need help quitting, ask your health care provider.  Get at least 30 minutes of physical activity on 5  or more days each week.  Avoid alcoholic and caffeinated beverages, as well as spicy foods. This may help prevent hot flashes.  Get 7-8 hours of sleep each night.  If you have hot flashes, try: ? Dressing in layers. ? Avoiding things that may trigger hot flashes, such as spicy food, warm places, or stress. ? Taking slow, deep breaths when a hot flash starts. ? Keeping a fan in your home and office.  Find ways to manage stress, such as deep breathing, meditation, or journaling.  Consider going to group therapy with other women who are having menopause symptoms. Ask your health care provider about recommended group therapy meetings. Eating and drinking  Eat a healthy, balanced diet that contains whole grains, lean protein, low-fat dairy, and plenty of fruits and vegetables.  Your health care provider may recommend adding more soy to your diet. Foods that contain soy include tofu, tempeh, and soy milk.  Eat plenty of foods that contain calcium and vitamin D for bone health. Items that are rich in calcium include low-fat milk, yogurt, beans, almonds, sardines, broccoli, and kale. Medicines  Take over-the-counter and prescription medicines only as told by your health care provider.  Talk with your health care provider before starting any herbal supplements. If prescribed, take vitamins and supplements as told by your health care provider. These may include: ? Calcium. Women age 31 and older should get 1,200 mg (milligrams) of  calcium every day. ? Vitamin D. Women need 600-800 International Units of vitamin D each day. ? Vitamins B12 and B6. Aim for 50 micrograms of B12 and 1.5 mg of B6 each day. General instructions  Keep track of your menstrual periods, including: ? When they occur. ? How heavy they are and how long they last. ? How much time passes between periods.  Keep track of your symptoms, noting when they start, how often you have them, and how long they last.  Use vaginal lubricants or moisturizers to help with vaginal dryness and improve comfort during sex.  Keep all follow-up visits as told by your health care provider. This is important. This includes any group therapy or counseling. Contact a health care provider if:  You are still having menstrual periods after age 32.  You have pain during sex.  You have not had a period for 12 months and you develop vaginal bleeding. Get help right away if:  You have: ? Severe depression. ? Excessive vaginal bleeding. ? Pain when you urinate. ? A fast or irregular heart beat (palpitations). ? Severe headaches. ? Abdomen (abdominal) pain or severe indigestion.  You fell and you think you have a broken bone.  You develop leg or chest pain.  You develop vision problems.  You feel a lump in your breast. Summary  Menopause is the normal time of life when menstrual periods stop completely. It is usually confirmed by 12 months without a menstrual period.  The transition to menopause (perimenopause) most often happens between the ages of 71 and 36.  Symptoms can be managed through medicines, lifestyle changes, and complementary therapies such as acupuncture.  Eat a balanced diet that is rich in nutrients to promote bone health and heart health and to manage symptoms during menopause. This information is not intended to replace advice given to you by your health care provider. Make sure you discuss any questions you have with your health care  provider. Document Revised: 02/07/2017 Document Reviewed: 03/30/2016 Elsevier Patient Education  2020 [employer].

## 2019-06-17 NOTE — Progress Notes (Signed)
Subjective:    Patient ID: Caroline Velez, female    DOB: 03-13-1955, 64 y.o.   MRN: [id number]  No chief complaint on file.   HPI Patient was seen today for ongoing concern.  Pt endorses intermittent chest pressure x a few wks.  Can occur at rest.  Sensation is in center of chest and shoots to left upper extremity.  Pt tried aspirin for her symptoms.  Denies heartburn, dizziness, fatigue, nausea/vomiting.  States has discomfort in LUE from shoulder to elbow that feels like a dull toothache. Not checking fsbs at home as needs a new glucometer.  Notes increased hot flashes at night.  Past Medical History:  Diagnosis Date  . Allergy   . Diabetes mellitus without complication ([place])   . Hyperlipidemia   . 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     No Known Allergies  ROS General: Denies fever, chills, night sweats, changes in weight, changes in appetite +hot flashes HEENT: Denies headaches, ear pain, changes in vision, rhinorrhea, sore throat CV: Denies CP, palpitations, SOB, orthopnea  +intermittent chest pressure Pulm: Denies SOB, cough, wheezing GI: Denies abdominal pain, nausea, vomiting, diarrhea, constipation GU: Denies dysuria, hematuria, frequency, vaginal discharge Msk: Denies muscle cramps, joint pains  +L arm pain Neuro: Denies weakness, numbness, tingling Skin: Denies rashes, bruising Psych: Denies depression, anxiety, hallucinations    Objective:    Blood pressure 120/80, pulse 72, temperature 97.8 F (36.6 C), temperature source Temporal, weight 246 lb (111.6 kg), SpO2 98 %.   Gen. Pleasant, well-nourished, in no distress, normal affect  HEENT: [place]/AT, face symmetric, no scleral icterus, PERRLA, EOMI, nares patent without drainage Lungs: no accessory muscle use, CTAB, no wheezes or rales Cardiovascular: RRR, no m/r/g, no peripheral edema Musculoskeletal: No TTP of chest.  No deformities, no cyanosis or clubbing, normal tone Neuro:  A&Ox3, CN II-XII  intact, normal gait Skin:  Warm, no lesions/ rash  Wt Readings from Last 3 Encounters:  06/17/19 246 lb (111.6 kg)  01/07/18 239 lb (108.4 kg)  12/30/17 243 lb (110.2 kg)    Lab Results  Component Value Date   WBC 6.0 02/17/2019   HGB 12.0 02/17/2019   HCT 36.9 02/17/2019   PLT 302.0 02/17/2019   GLUCOSE 106 (H) 02/17/2019   CHOL 255 (H) 02/17/2019   TRIG 93.0 02/17/2019   HDL 47.30 02/17/2019   LDLCALC 189 (H) 02/17/2019   ALT 14 01/22/2017   AST 16 01/22/2017   NA 139 02/17/2019   K 4.2 02/17/2019   CL 105 02/17/2019   CREATININE 0.89 02/17/2019   BUN 18 02/17/2019   CO2 28 02/17/2019   TSH 0.646 01/06/2014   INR 1.07 05/02/2009   HGBA1C 6.5 02/17/2019   MICROALBUR 0.7 02/17/2019    Assessment/Plan:  Chest pressure  -Discussed warning signs of heart attack and stroke -Start ASA 81 mg daily -Lipid panel 02/17/2019 with elevated total cholesterol and LDL.  Discussed starting statin.  Patient declines at this time -Discussed the importance of lifestyle modifications -EKG this visit with short PR interval 0.10 and T wave inversion in V1.  Compared to previous study from 06/04/2017.  Cannot r/o ischemia. -Referral to Cards for stress test strongly advised.  Pt declined.  -Given precautions for continued or worsening symptoms - Plan: EKG 12-Lead  Hot flashes -Discussed various medication options -Pt to try OTC black cohosh -Given handout  Controlled type 2 diabetes mellitus without complication, without long-term current use of insulin (H[facility]) -Hemoglobin A1c 6.5% on 02/17/2019 -Continue lifestyle  modifications -Continue Metformin 500 mg twice daily - Plan: blood glucose meter kit and supplies  F/u in 2-4 wks, sooner if needed  Grier Mitts, MD

## 2019-06-20 ENCOUNTER — Encounter: Payer: Self-pay | Admitting: Family Medicine

## 2019-07-02 ENCOUNTER — Other Ambulatory Visit: Payer: Self-pay

## 2019-07-02 ENCOUNTER — Telehealth: Payer: Self-pay | Admitting: Family Medicine

## 2019-07-02 DIAGNOSIS — E119 Type 2 diabetes mellitus without complications: Secondary | ICD-10-CM

## 2019-07-02 MED ORDER — METFORMIN HCL 500 MG PO TABS: 500.0000 mg | ORAL_TABLET | Freq: Two times a day (BID) | ORAL | 0 refills | Status: DC

## 2019-07-02 NOTE — Telephone Encounter (Signed)
Rx sent 

## 2019-07-02 NOTE — Telephone Encounter (Signed)
Pt need a refill on metFORMIN (GLUCOPHAGE) 500 sent to  [place], [place] - [address] [place] [address] [place] Phone:  [phone]  Fax:  [phone]

## 2019-07-12 ENCOUNTER — Ambulatory Visit (INDEPENDENT_AMBULATORY_CARE_PROVIDER_SITE_OTHER): Payer: BC Managed Care – PPO | Admitting: Family Medicine

## 2019-07-12 ENCOUNTER — Encounter: Payer: Self-pay | Admitting: Family Medicine

## 2019-07-12 VITALS — BP 128/78 | HR 72 | Temp 97.9°F | Wt 245.0 lb

## 2019-07-12 DIAGNOSIS — E119 Type 2 diabetes mellitus without complications: Secondary | ICD-10-CM

## 2019-07-12 DIAGNOSIS — R0789 Other chest pain: Secondary | ICD-10-CM | POA: Diagnosis not present

## 2019-07-12 DIAGNOSIS — N951 Menopausal and female climacteric states: Secondary | ICD-10-CM | POA: Diagnosis not present

## 2019-07-12 LAB — POCT GLYCOSYLATED HEMOGLOBIN (HGB A1C): Hemoglobin A1C: 5.9 % — AB (ref 4.0–5.6)

## 2019-07-12 LAB — POCT GLUCOSE (DEVICE FOR HOME USE): Glucose Fasting, POC: 108 mg/dL — AB (ref 70–99)

## 2019-07-12 MED ORDER — BLOOD GLUCOSE METER KIT: PACK | 0 refills | Status: DC

## 2019-07-12 NOTE — Progress Notes (Signed)
Subjective:    Patient ID: Caroline Velez, female    DOB: August 15, 1955, 64 y.o.   MRN: [id number]  No chief complaint on file.   HPI Patient was seen today for f/u.  Pt states she never got the rx for a new glucometer.  Taking Metfromin 500 mg BID.  States has been feeling ok.  Denies HAs, CP, dizziness.  Was unable to find black coash at the store.  Still having hot flashes.  Pt denies chest pressure since starting ASA 81 mg daily.  Past Medical History:  Diagnosis Date  . Allergy   . Diabetes mellitus without complication ([place])   . Hyperlipidemia   . 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     No Known Allergies  ROS General: Denies fever, chills, night sweats, changes in weight, changes in appetite HEENT: Denies headaches, ear pain, changes in vision, rhinorrhea, sore throat CV: Denies CP, palpitations, SOB, orthopnea Pulm: Denies SOB, cough, wheezing GI: Denies abdominal pain, nausea, vomiting, diarrhea, constipation GU: Denies dysuria, hematuria, frequency, vaginal discharge Msk: Denies muscle cramps, joint pains Neuro: Denies weakness, numbness, tingling Skin: Denies rashes, bruising Psych: Denies depression, anxiety, hallucinations    Objective:    Blood pressure 128/78, pulse 72, temperature 97.9 F (36.6 C), temperature source Temporal, weight 245 lb (111.1 kg), SpO2 96 %.  Gen. Pleasant, well-nourished, in no distress, normal affect   HEENT: [place]/AT, face symmetric, no scleral icterus, PERRLA, EOMI, nares patent without drainage Lungs: no accessory muscle use, CTAB, no wheezes or rales Neuro:  A&Ox3, CN II-XII intact, normal gait Skin:  Warm, no lesions/ rash  Diabetic Foot Exam - Simple   Simple Foot Form Diabetic Foot exam was performed with the following findings: Yes 07/12/2019  8:26 AM  Visual Inspection No deformities, no ulcerations, no other skin breakdown bilaterally: Yes Sensation Testing Intact to touch and monofilament testing bilaterally:  Yes Pulse Check Posterior Tibialis and Dorsalis pulse intact bilaterally: Yes Comments     Wt Readings from Last 3 Encounters:  07/12/19 245 lb (111.1 kg)  06/17/19 246 lb (111.6 kg)  01/07/18 239 lb (108.4 kg)    Lab Results  Component Value Date   WBC 6.0 02/17/2019   HGB 12.0 02/17/2019   HCT 36.9 02/17/2019   PLT 302.0 02/17/2019   GLUCOSE 106 (H) 02/17/2019   CHOL 255 (H) 02/17/2019   TRIG 93.0 02/17/2019   HDL 47.30 02/17/2019   LDLCALC 189 (H) 02/17/2019   ALT 14 01/22/2017   AST 16 01/22/2017   NA 139 02/17/2019   K 4.2 02/17/2019   CL 105 02/17/2019   CREATININE 0.89 02/17/2019   BUN 18 02/17/2019   CO2 28 02/17/2019   TSH 0.646 01/06/2014   INR 1.07 05/02/2009   HGBA1C 6.5 02/17/2019   MICROALBUR 0.7 02/17/2019    Assessment/Plan:  Controlled type 2 diabetes mellitus without complication, without long-term current use of insulin (HCC)  -fsbs this visit -hgb A1 C -foot exam done -continue metformin 500 mg BID -continue lifestyle modifications -rx for glucometer reprinted from 06/17/19 - Plan: blood glucose meter kit and supplies, hgb A1C, fsbs  Hot flashes due to menopause -discussed black coash -given handout  Chest pressure -resolved -continue ASA 81 mg -given precautions  F/u in 3 months, sooner if needed  Grier Mitts, MD

## 2019-07-12 NOTE — Patient Instructions (Signed)
Menopause Menopause is the normal time of life when menstrual periods stop completely. It is usually confirmed by 12 months without a menstrual period. The transition to menopause (perimenopause) most often happens between the ages of 45 and 55. During perimenopause, hormone levels change in your body, which can cause symptoms and affect your health. Menopause may increase your risk for:  Loss of bone (osteoporosis), which causes bone breaks (fractures).  Depression.  Hardening and narrowing of the arteries (atherosclerosis), which can cause heart attacks and strokes. What are the causes? This condition is usually caused by a natural change in hormone levels that happens as you get older. The condition may also be caused by surgery to remove both ovaries (bilateral oophorectomy). What increases the risk? This condition is more likely to start at an earlier age if you have certain medical conditions or treatments, including:  A tumor of the pituitary gland in the brain.  A disease that affects the ovaries and hormone production.  Radiation treatment for cancer.  Certain cancer treatments, such as chemotherapy or hormone (anti-estrogen) therapy.  Heavy smoking and excessive alcohol use.  Family history of early menopause. This condition is also more likely to develop earlier in women who are very thin. What are the signs or symptoms? Symptoms of this condition include:  Hot flashes.  Irregular menstrual periods.  Night sweats.  Changes in feelings about sex. This could be a decrease in sex drive or an increased comfort around your sexuality.  Vaginal dryness and thinning of the vaginal walls. This may cause painful intercourse.  Dryness of the skin and development of wrinkles.  Headaches.  Problems sleeping (insomnia).  Mood swings or irritability.  Memory problems.  Weight gain.  Hair growth on the face and chest.  Bladder infections or problems with urinating. How  is this diagnosed? This condition is diagnosed based on your medical history, a physical exam, your age, your menstrual history, and your symptoms. Hormone tests may also be done. How is this treated? In some cases, no treatment is needed. You and your health care provider should make a decision together about whether treatment is necessary. Treatment will be based on your individual condition and preferences. Treatment for this condition focuses on managing symptoms. Treatment may include:  Menopausal hormone therapy (MHT).  Medicines to treat specific symptoms or complications.  Acupuncture.  Vitamin or herbal supplements. Before starting treatment, make sure to let your health care provider know if you have a personal or family history of:  Heart disease.  Breast cancer.  Blood clots.  Diabetes.  Osteoporosis. Follow these instructions at home: Lifestyle  Do not use any products that contain nicotine or tobacco, such as cigarettes and e-cigarettes. If you need help quitting, ask your health care provider.  Get at least 30 minutes of physical activity on 5 or more days each week.  Avoid alcoholic and caffeinated beverages, as well as spicy foods. This may help prevent hot flashes.  Get 7-8 hours of sleep each night.  If you have hot flashes, try: ? Dressing in layers. ? Avoiding things that may trigger hot flashes, such as spicy food, warm places, or stress. ? Taking slow, deep breaths when a hot flash starts. ? Keeping a fan in your home and office.  Find ways to manage stress, such as deep breathing, meditation, or journaling.  Consider going to group therapy with other women who are having menopause symptoms. Ask your health care provider about recommended group therapy meetings. Eating and   drinking  Eat a healthy, balanced diet that contains whole grains, lean protein, low-fat dairy, and plenty of fruits and vegetables.  Your health care provider may recommend  adding more soy to your diet. Foods that contain soy include tofu, tempeh, and soy milk.  Eat plenty of foods that contain calcium and vitamin D for bone health. Items that are rich in calcium include low-fat milk, yogurt, beans, almonds, sardines, broccoli, and kale. Medicines  Take over-the-counter and prescription medicines only as told by your health care provider.  Talk with your health care provider before starting any herbal supplements. If prescribed, take vitamins and supplements as told by your health care provider. These may include: ? Calcium. Women age 70 and older should get 1,200 mg (milligrams) of calcium every day. ? Vitamin D. Women need 600-800 International Units of vitamin D each day. ? Vitamins B12 and B6. Aim for 50 micrograms of B12 and 1.5 mg of B6 each day. General instructions  Keep track of your menstrual periods, including: ? When they occur. ? How heavy they are and how long they last. ? How much time passes between periods.  Keep track of your symptoms, noting when they start, how often you have them, and how long they last.  Use vaginal lubricants or moisturizers to help with vaginal dryness and improve comfort during sex.  Keep all follow-up visits as told by your health care provider. This is important. This includes any group therapy or counseling. Contact a health care provider if:  You are still having menstrual periods after age 37.  You have pain during sex.  You have not had a period for 12 months and you develop vaginal bleeding. Get help right away if:  You have: ? Severe depression. ? Excessive vaginal bleeding. ? Pain when you urinate. ? A fast or irregular heart beat (palpitations). ? Severe headaches. ? Abdomen (abdominal) pain or severe indigestion.  You fell and you think you have a broken bone.  You develop leg or chest pain.  You develop vision problems.  You feel a lump in your breast. Summary  Menopause is the normal  time of life when menstrual periods stop completely. It is usually confirmed by 12 months without a menstrual period.  The transition to menopause (perimenopause) most often happens between the ages of 85 and 15.  Symptoms can be managed through medicines, lifestyle changes, and complementary therapies such as acupuncture.  Eat a balanced diet that is rich in nutrients to promote bone health and heart health and to manage symptoms during menopause. This information is not intended to replace advice given to you by your health care provider. Make sure you discuss any questions you have with your health care provider. Document Revised: 02/07/2017 Document Reviewed: 03/30/2016 Elsevier Patient Education  2020 [place].  Diabetes Basics  Diabetes (diabetes mellitus) is a long-term (chronic) disease. It occurs when the body does not properly use sugar (glucose) that is released from food after you eat. Diabetes may be caused by one or both of these problems:  Your pancreas does not make enough of a hormone called insulin.  Your body does not react in a normal way to insulin that it makes. Insulin lets sugars (glucose) go into cells in your body. This gives you energy. If you have diabetes, sugars cannot get into cells. This causes high blood sugar (hyperglycemia). Follow these instructions at home: How is diabetes treated? You may need to take insulin or other diabetes medicines daily  to keep your blood sugar in balance. Take your diabetes medicines every day as told by your doctor. List your diabetes medicines here: Diabetes medicines  Name of medicine: ______________________________ ? Amount (dose): _______________ Time (a.m./p.m.): _______________ Notes: ___________________________________  Name of medicine: ______________________________ ? Amount (dose): _______________ Time (a.m./p.m.): _______________ Notes: ___________________________________  Name of medicine:  ______________________________ ? Amount (dose): _______________ Time (a.m./p.m.): _______________ Notes: ___________________________________ If you use insulin, you will learn how to give yourself insulin by injection. You may need to adjust the amount based on the food that you eat. List the types of insulin you use here: Insulin  Insulin type: ______________________________ ? Amount (dose): _______________ Time (a.m./p.m.): _______________ Notes: ___________________________________  Insulin type: ______________________________ ? Amount (dose): _______________ Time (a.m./p.m.): _______________ Notes: ___________________________________  Insulin type: ______________________________ ? Amount (dose): _______________ Time (a.m./p.m.): _______________ Notes: ___________________________________  Insulin type: ______________________________ ? Amount (dose): _______________ Time (a.m./p.m.): _______________ Notes: ___________________________________  Insulin type: ______________________________ ? Amount (dose): _______________ Time (a.m./p.m.): _______________ Notes: ___________________________________ How do I manage my blood sugar?  Check your blood sugar levels using a blood glucose monitor as directed by your doctor. Your doctor will set treatment goals for you. Generally, you should have these blood sugar levels:  Before meals (preprandial): 80-130 mg/dL (4.4-7.2 mmol/L).  After meals (postprandial): below 180 mg/dL (10 mmol/L).  A1c level: less than 7%. Write down the times that you will check your blood sugar levels: Blood sugar checks  Time: _______________ Notes: ___________________________________  Time: _______________ Notes: ___________________________________  Time: _______________ Notes: ___________________________________  Time: _______________ Notes: ___________________________________  Time: _______________ Notes: ___________________________________  Time:  _______________ Notes: ___________________________________  What do I need to know about low blood sugar? Low blood sugar is called hypoglycemia. This is when blood sugar is at or below 70 mg/dL (3.9 mmol/L). Symptoms may include:  Feeling: ? Hungry. ? Worried or nervous (anxious). ? Sweaty and clammy. ? Confused. ? Dizzy. ? Sleepy. ? Sick to your stomach (nauseous).  Having: ? A fast heartbeat. ? A headache. ? A change in your vision. ? Tingling or no feeling (numbness) around the mouth, lips, or tongue. ? Jerky movements that you cannot control (seizure).  Having trouble with: ? Moving (coordination). ? Sleeping. ? Passing out (fainting). ? Getting upset easily (irritability). Treating low blood sugar To treat low blood sugar, eat or drink something sugary right away. If you can think clearly and swallow safely, follow the 15:15 rule:  Take 15 grams of a fast-acting carb (carbohydrate). Talk with your doctor about how much you should take.  Some fast-acting carbs are: ? Sugar tablets (glucose pills). Take 3-4 glucose pills. ? 6-8 pieces of hard candy. ? 4-6 oz (120-150 mL) of fruit juice. ? 4-6 oz (120-150 mL) of regular (not diet) soda. ? 1 Tbsp (15 mL) honey or sugar.  Check your blood sugar 15 minutes after you take the carb.  If your blood sugar is still at or below 70 mg/dL (3.9 mmol/L), take 15 grams of a carb again.  If your blood sugar does not go above 70 mg/dL (3.9 mmol/L) after 3 tries, get help right away.  After your blood sugar goes back to normal, eat a meal or a snack within 1 hour. Treating very low blood sugar If your blood sugar is at or below 54 mg/dL (3 mmol/L), you have very low blood sugar (severe hypoglycemia). This is an emergency. Do not wait to see if the symptoms will go away. Get medical help right away. Call your  local emergency services (911 in the [place] Do not drive yourself to the hospital. Questions to ask your health care  provider  Do I need to meet with a diabetes educator?  What equipment will I need to care for myself at home?  What diabetes medicines do I need? When should I take them?  How often do I need to check my blood sugar?  What number can I call if I have questions?  When is my next doctor's visit?  Where can I find a support group for people with diabetes? Where to find more information  American Diabetes Association: www.diabetes.org  American Association of Diabetes Educators: www.diabeteseducator.org/patient-resources Contact a doctor if:  Your blood sugar is at or above 240 mg/dL (13.3 mmol/L) for 2 days in a row.  You have been sick or have had a fever for 2 days or more, and you are not getting better.  You have any of these problems for more than 6 hours: ? You cannot eat or drink. ? You feel sick to your stomach (nauseous). ? You throw up (vomit). ? You have watery poop (diarrhea). Get help right away if:  Your blood sugar is lower than 54 mg/dL (3 mmol/L).  You get confused.  You have trouble: ? Thinking clearly. ? Breathing. Summary  Diabetes (diabetes mellitus) is a long-term (chronic) disease. It occurs when the body does not properly use sugar (glucose) that is released from food after digestion.  Take insulin and diabetes medicines as told.  Check your blood sugar every day, as often as told.  Keep all follow-up visits as told by your doctor. This is important. This information is not intended to replace advice given to you by your health care provider. Make sure you discuss any questions you have with your health care provider. Document Revised: 11/18/2018 Document Reviewed: 05/30/2017 Elsevier Patient Education  [place].

## 2019-10-05 ENCOUNTER — Other Ambulatory Visit: Payer: Self-pay

## 2019-10-05 ENCOUNTER — Ambulatory Visit
Admission: EM | Admit: 2019-10-05 | Discharge: 2019-10-05 | Disposition: A | Discharge status: Home / Self Care | Payer: BC Managed Care – PPO | Attending: Family Medicine | Admitting: Family Medicine

## 2019-10-05 ENCOUNTER — Encounter: Payer: Self-pay | Admitting: Emergency Medicine

## 2019-10-05 DIAGNOSIS — R52 Pain, unspecified: Secondary | ICD-10-CM | POA: Diagnosis not present

## 2019-10-05 NOTE — Discharge Instructions (Addendum)
We have checked you for covid You can check your my chart for results or we will call you if positive  Keep taking the aleve as needed.  Follow up as needed for continued or worsening symptoms

## 2019-10-05 NOTE — ED Triage Notes (Signed)
Pt having body aches x 2-3 days. Pt reports that body aches are normal for her because she has arthritis. States she has not had a covid test in a long time and just wanted to make sure it is not covid.

## 2019-10-06 LAB — SARS-COV-2, NAA 2 DAY TAT

## 2019-10-06 LAB — NOVEL CORONAVIRUS, NAA: SARS-CoV-2, NAA: NOT DETECTED

## 2019-10-06 NOTE — ED Provider Notes (Signed)
[facility]    CSN: [id number] Arrival date & time: 10/05/19  7121      History   Chief Complaint Chief Complaint  Patient presents with  . Generalized Body Aches    HPI Caroline Velez is a 64 y.o. female.   Patient is a 64 year old female with past medical history of allergy, diabetes, hyperlipidemia, hypertension.  She presents today having generalized body aches for 2 to 3 days.  Relates this to her typical arthritis flare.  Has been taking aleve for symptoms.  Denies any other associated symptoms to include fever, chills, cough, chest congestion, nasal congestion, rhinorrhea, sore throat.  Would like to be tested for Covid.     Past Medical History:  Diagnosis Date  . Allergy   . Diabetes mellitus without complication ([place])   . Hyperlipidemia   . 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 Seasonal allergies 01/22/2017  . Essential hypertension 01/21/2014  . Chest pain 01/06/2014  . Healthcare maintenance 01/25/2013  . 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 Headache 01/10/2013  . Mental status change 01/10/2013  . Bacterial meningitis 01/10/2013  . Fever 01/10/2013  . Sepsis ([place]) 01/10/2013  . Low back pain radiating to left leg 07/26/2010    Past Surgical History:  Procedure Laterality Date  . BREAST SURGERY     bilateral  . PARTIAL HYSTERECTOMY      OB History   No obstetric history on file.      Home Medications    Prior to Admission medications   Medication Sig Start Date End Date Taking? Authorizing Provider  aspirin-acetaminophen-caffeine (EXCEDRIN MIGRAINE) [id number] MG per tablet Take 1 tablet by mouth every 6 (six) hours as needed for pain.    [provider]  blood glucose meter kit and supplies Dispense based on patient and insurance preference. Use up to four times daily as directed. (FOR ICD-10 E10.9, E11.9). 07/12/19   Billie Ruddy, MD  diclofenac  sodium (VOLTAREN) 1 % GEL Apply 4 g topically 4 (four) times daily as needed (for right knee pain). 07/16/18   Billie Ruddy, MD  metFORMIN (GLUCOPHAGE) 500 MG tablet Take 1 tablet (500 mg total) by mouth 2 (two) times daily with a meal. 07/02/19   Billie Ruddy, MD    Family History Family History  Problem Relation Age of Onset  . Cancer Mother        breast  . Hypertension Father     Social History Social History   Tobacco Use  . Smoking status: Never Smoker  . Smokeless tobacco: Never Used  Vaping Use  . Vaping Use: Never used  Substance Use Topics  . Alcohol use: No  . Drug use: No     Allergies   Patient has no known allergies.   Review of Systems Review of Systems   Physical Exam Triage Vital Signs ED Triage Vitals  Enc Vitals Group     BP 10/05/19 0845 120/66     Pulse Rate 10/05/19 0845 96     Resp 10/05/19 0845 17     Temp 10/05/19 0845 98.4 F (36.9 C)     Temp Source 10/05/19 0845 Oral     SpO2 10/05/19 0845 94 %     Weight 10/05/19 0839 (!) 243 lb (110.2 kg)     Height 10/05/19 0839 '5\' 2"'  (1.575 m)     Head Circumference --  Peak Flow --      Pain Score 10/05/19 0839 0     Pain Loc --      Pain Edu? --      Excl. in [place]? --    No data found.  Updated Vital Signs BP 120/66 (BP Location: Right Arm)   Pulse 96   Temp 98.4 F (36.9 C) (Oral)   Resp 17   Ht '5\' 2"'  (1.575 m)   Wt (!) 243 lb (110.2 kg)   SpO2 94%   BMI 44.45 kg/m   Visual Acuity Right Eye Distance:   Left Eye Distance:   Bilateral Distance:    Right Eye Near:   Left Eye Near:    Bilateral Near:     Physical Exam Vitals and nursing note reviewed.  Constitutional:      General: She is not in acute distress.    Appearance: Normal appearance. She is not ill-appearing, toxic-appearing or diaphoretic.  HENT:     Head: Normocephalic.     Nose: Nose normal.  Eyes:     Conjunctiva/sclera: Conjunctivae normal.  Pulmonary:     Effort: Pulmonary effort is normal.    Musculoskeletal:        General: Normal range of motion.     Cervical back: Normal range of motion.  Skin:    General: Skin is warm and dry.     Findings: No rash.  Neurological:     Mental Status: She is alert.  Psychiatric:        Mood and Affect: Mood normal.      UC Treatments / Results  Labs (all labs ordered are listed, but only abnormal results are displayed) Labs Reviewed  NOVEL CORONAVIRUS, NAA    EKG   Radiology No results found.  Procedures Procedures (including critical care time)  Medications Ordered in UC Medications - No data to display  Initial Impression / Assessment and Plan / UC Course  I have reviewed the triage vital signs and the nursing notes.  Pertinent labs & imaging results that were available during my care of the patient were reviewed by me and considered in my medical decision making (see chart for details).     Body aches Most likely her typical arthritis flare. She is not having any other associated symptoms. Covid swab done as requested Aleve as needed Follow up as needed for continued or worsening symptoms  Final Clinical Impressions(s) / UC Diagnoses   Final diagnoses:  Generalized body aches     Discharge Instructions     We have checked you for covid You can check your my chart for results or we will call you if positive  Keep taking the aleve as needed.  Follow up as needed for continued or worsening symptoms     ED Prescriptions    None     PDMP not reviewed this encounter.   Loura Halt A, NP 10/06/19 1408

## 2019-10-11 ENCOUNTER — Other Ambulatory Visit: Payer: Self-pay

## 2019-10-11 ENCOUNTER — Telehealth: Payer: Self-pay | Admitting: Family Medicine

## 2019-10-11 ENCOUNTER — Encounter: Payer: Self-pay | Admitting: Family Medicine

## 2019-10-11 ENCOUNTER — Ambulatory Visit: Payer: BC Managed Care – PPO | Admitting: Family Medicine

## 2019-10-11 VITALS — BP 124/74 | HR 78 | Temp 97.9°F | Wt 240.0 lb

## 2019-10-11 DIAGNOSIS — R0602 Shortness of breath: Secondary | ICD-10-CM | POA: Diagnosis not present

## 2019-10-11 DIAGNOSIS — R5383 Other fatigue: Secondary | ICD-10-CM

## 2019-10-11 DIAGNOSIS — R0789 Other chest pain: Secondary | ICD-10-CM

## 2019-10-11 DIAGNOSIS — E119 Type 2 diabetes mellitus without complications: Secondary | ICD-10-CM

## 2019-10-11 MED ORDER — METFORMIN HCL 500 MG PO TABS: 500.0000 mg | ORAL_TABLET | Freq: Two times a day (BID) | ORAL | 1 refills | Status: DC

## 2019-10-11 NOTE — Patient Instructions (Signed)
Fatigue If you have fatigue, you feel tired all the time and have a lack of energy or a lack of motivation. Fatigue may make it difficult to start or complete tasks because of exhaustion. In general, occasional or mild fatigue is often a normal response to activity or life. However, long-lasting (chronic) or extreme fatigue may be a symptom of a medical condition. Follow these instructions at home: General instructions  Watch your fatigue for any changes.  Go to bed and get up at the same time every day.  Avoid fatigue by pacing yourself during the day and getting enough sleep at night.  Maintain a healthy weight. Medicines  Take over-the-counter and prescription medicines only as told by your health care provider.  Take a multivitamin, if told by your health care provider.  Do not use herbal or dietary supplements unless they are approved by your health care provider. Activity   Exercise regularly, as told by your health care provider.  Use or practice techniques to help you relax, such as yoga, tai chi, meditation, or massage therapy. Eating and drinking   Avoid heavy meals in the evening.  Eat a well-balanced diet, which includes lean proteins, whole grains, plenty of fruits and vegetables, and low-fat dairy products.  Avoid consuming too much caffeine.  Avoid the use of alcohol.  Drink enough fluid to keep your urine pale yellow. Lifestyle  Change situations that cause you stress. Try to keep your work and personal schedule in balance.  Do not use any products that contain nicotine or tobacco, such as cigarettes and e-cigarettes. If you need help quitting, ask your health care provider.  Do not use drugs. Contact a health care provider if:  Your fatigue does not get better.  You have a fever.  You suddenly lose or gain weight.  You have headaches.  You have trouble falling asleep or sleeping through the night.  You feel angry, guilty, anxious, or  sad.  You are unable to have a bowel movement (constipation).  Your skin is dry.  You have swelling in your legs or another part of your body. Get help right away if:  You feel confused.  Your vision is blurry.  You feel faint or you pass out.  You have a severe headache.  You have severe pain in your abdomen, your back, or the area between your waist and hips (pelvis).  You have chest pain, shortness of breath, or an irregular or fast heartbeat.  You are unable to urinate, or you urinate less than normal.  You have abnormal bleeding, such as bleeding from the rectum, vagina, nose, lungs, or nipples.  You vomit blood.  You have thoughts about hurting yourself or others. If you ever feel like you may hurt yourself or others, or have thoughts about taking your own life, get help right away. You can go to your nearest emergency department or call:  Your local emergency services (911 in the [place]  A suicide crisis helpline, such as the [place] at 9164775271. This is open 24 hours a day. Summary  If you have fatigue, you feel tired all the time and have a lack of energy or a lack of motivation.  Fatigue may make it difficult to start or complete tasks because of exhaustion.  Long-lasting (chronic) or extreme fatigue may be a symptom of a medical condition.  Exercise regularly, as told by your health care provider.  Change situations that cause you stress. Try to keep your  work and personal schedule in balance. This information is not intended to replace advice given to you by your health care provider. Make sure you discuss any questions you have with your health care provider. Document Revised: 09/16/2018 Document Reviewed: 11/20/2016 Elsevier Patient Education  [place] of Breath, Adult Shortness of breath is when a person has trouble breathing enough air or when a person feels like she or he is having trouble  breathing in enough air. Shortness of breath could be a sign of a medical problem. Follow these instructions at home:   Pay attention to any changes in your symptoms.  Do not use any products that contain nicotine or tobacco, such as cigarettes, e-cigarettes, and chewing tobacco.  Do not smoke. Smoking is a common cause of shortness of breath. If you need help quitting, ask your health care provider.  Avoid things that can irritate your airways, such as: ? Mold. ? Dust. ? Air pollution. ? Chemical fumes. ? Things that can cause allergy symptoms (allergens), if you have allergies.  Keep your living space clean and free of mold and dust.  Rest as needed. Slowly return to your usual activities.  Take over-the-counter and prescription medicines only as told by your health care provider. This includes oxygen therapy and inhaled medicines.  Keep all follow-up visits as told by your health care provider. This is important. Contact a health care provider if:  Your condition does not improve as soon as expected.  You have a hard time doing your normal activities, even after you rest.  You have new symptoms. Get help right away if:  Your shortness of breath gets worse.  You have shortness of breath when you are resting.  You feel light-headed or you faint.  You have a cough that is not controlled with medicines.  You cough up blood.  You have pain with breathing.  You have pain in your chest, arms, shoulders, or abdomen.  You have a fever.  You cannot walk up stairs or exercise the way that you normally do. These symptoms may represent a serious problem that is an emergency. Do not wait to see if the symptoms will go away. Get medical help right away. Call your local emergency services (911 in the [place] Do not drive yourself to the hospital. Summary  Shortness of breath is when a person has trouble breathing enough air. It can be a sign of a medical problem.  Avoid  things that irritate your lungs, such as smoking, pollution, mold, and dust.  Pay attention to changes in your symptoms and contact your health care provider if you have a hard time completing daily activities because of shortness of breath. This information is not intended to replace advice given to you by your health care provider. Make sure you discuss any questions you have with your health care provider. Document Revised: 07/28/2017 Document Reviewed: 07/28/2017 Elsevier Patient Education  [place].  Nonspecific Chest Pain, Adult Chest pain can be caused by many different conditions. It can be caused by a condition that is life-threatening and requires treatment right away. It can also be caused by something that is not life-threatening. If you have chest pain, it can be hard to know the difference, so it is important to get help right away to make sure that you do not have a serious condition. Some life-threatening causes of chest pain include:  Heart attack.  A tear in the body's main blood vessel (aortic dissection).  Inflammation around your heart (pericarditis).  A problem in the lungs, such as a blood clot (pulmonary embolism) or a collapsed lung (pneumothorax). Some non life-threatening causes of chest pain include:  Heartburn.  Anxiety or stress.  Damage to the bones, muscles, and cartilage that make up your chest wall.  Pneumonia or bronchitis.  Shingles infection (varicella-zoster virus). Chest pain can feel like:  Pain or discomfort on the surface of your chest or deep in your chest.  Crushing, pressure, aching, or squeezing pain.  Burning or tingling.  Dull or sharp pain that is worse when you move, cough, or take a deep breath.  Pain or discomfort that is also felt in your back, neck, jaw, shoulder, or arm, or pain that spreads to any of these areas. Your chest pain may come and go. It may also be constant. Your health care provider will do lab  tests and other studies to find the cause of your pain. Treatment will depend on the cause of your chest pain. Follow these instructions at home: Medicines  Take over-the-counter and prescription medicines only as told by your health care provider.  If you were prescribed an antibiotic, take it as told by your health care provider. Do not stop taking the antibiotic even if you start to feel better. Lifestyle   Rest as directed by your health care provider.  Do not use any products that contain nicotine or tobacco, such as cigarettes and e-cigarettes. If you need help quitting, ask your health care provider.  Do not drink alcohol.  Make healthy lifestyle choices as recommended. These may include: ? Getting regular exercise. Ask your health care provider to suggest some activities that are safe for you. ? Eating a heart-healthy diet. This includes plenty of fresh fruits and vegetables, whole grains, low-fat (lean) protein, and low-fat dairy products. A dietitian can help you find healthy eating options. ? Maintaining a healthy weight. ? Managing any other health conditions you have, such as high blood pressure (hypertension) or diabetes. ? Reducing stress, such as with yoga or relaxation techniques. General instructions  Pay attention to any changes in your symptoms. Tell your health care provider about them or any new symptoms.  Avoid any activities that cause chest pain.  Keep all follow-up visits as told by your health care provider. This is important. This includes visits for any further testing if your chest pain does not go away. Contact a health care provider if:  Your chest pain does not go away.  You feel depressed.  You have a fever. Get help right away if:  Your chest pain gets worse.  You have a cough that gets worse, or you cough up blood.  You have severe pain in your abdomen.  You faint.  You have sudden, unexplained chest discomfort.  You have sudden,  unexplained discomfort in your arms, back, neck, or jaw.  You have shortness of breath at any time.  You suddenly start to sweat, or your skin gets clammy.  You feel nausea or you vomit.  You suddenly feel lightheaded or dizzy.  You have severe weakness, or unexplained weakness or fatigue.  Your heart begins to beat quickly, or it feels like it is skipping beats. These symptoms may represent a serious problem that is an emergency. Do not wait to see if the symptoms will go away. Get medical help right away. Call your local emergency services (911 in the [place] Do not drive yourself to the hospital. Summary  Chest pain  can be caused by a condition that is serious and requires urgent treatment. It may also be caused by something that is not life-threatening.  If you have chest pain, it is very important to see your health care provider. Your health care provider may do lab tests and other studies to find the cause of your pain.  Follow your health care provider's instructions on taking medicines, making lifestyle changes, and getting emergency treatment if symptoms become worse.  Keep all follow-up visits as told by your health care provider. This includes visits for any further testing if your chest pain does not go away. This information is not intended to replace advice given to you by your health care provider. Make sure you discuss any questions you have with your health care provider. Document Revised: 08/28/2017 Document Reviewed: 08/28/2017 Elsevier Patient Education  2020 [employer].

## 2019-10-11 NOTE — Telephone Encounter (Signed)
Pt is requesting a refill on Metformin 500 mg tablet. Pt uses [facility]-[id number]

## 2019-10-11 NOTE — Progress Notes (Signed)
Subjective:    Patient ID: Caroline Velez, female    DOB: April 21, 1955, 64 y.o.   MRN: [id number]  No chief complaint on file.   HPI Patient was seen today for f/u.  Pt notes increased fatigue times a few weeks after doing very well.  Pt states she only complains morning take a shower and feel exhausted.  Pt states sleep could be better as she is waking up more at night to urinate.  Pt endorses being told she snores.  Had a sleep study several years ago but does not remember the results.  Pt also notes pressure in her chest that feels like "cinderblocks on chest".  Sensation may last a few minutes.  At times relieved by drinking water.  At times as dizziness when getting up after having the CP.  Pt feeling full after eating a few bites of food. Endorses regular BMs.  Denies heart burn symptoms, LE edema.   Sleeping on 2-3 pillows at night.  Past Medical History:  Diagnosis Date  . Allergy   . Diabetes mellitus without complication ([place])   . Hyperlipidemia   . 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     No Known Allergies  ROS General: Denies fever, chills, night sweats, changes in weight  +changes in appetite, dizziness HEENT: Denies headaches, ear pain, changes in vision, rhinorrhea, sore throat CV: Denies CP, palpitations, orthopnea  +SOB, CP/pressure Pulm: Denies cough, wheezing  +SOB GI: Denies abdominal pain, nausea, vomiting, diarrhea, constipation GU: Denies dysuria, hematuria, frequency, vaginal discharge Msk: Denies muscle cramps, joint pains Neuro: Denies weakness, numbness, tingling Skin: Denies rashes, bruising Psych: Denies depression, anxiety, hallucinations     Objective:    Blood pressure 124/74, pulse 78, temperature 97.9 F (36.6 C), temperature source Oral, weight (!) 240 lb (108.9 kg), SpO2 99 %. BP supine 118/72, pulse 74 BP sitting 112/68 pulse 78, BP standing 120/70 pulse 92  Gen. Pleasant, well-nourished, in no distress, normal affect   HEENT:  [place]/AT, face symmetric, conjunctiva clear, no scleral icterus, PERRLA, EOMI, nares patent without drainage, pharynx without erythema or exudate. Neck: No JVD, no thyromegaly, no carotid bruits Lungs: no accessory muscle use, CTAB, no wheezes or rales Cardiovascular: RRR, no m/r/g, no peripheral edema Abdomen: BS present, soft, NT/ND, no hepatosplenomegaly. Musculoskeletal: No deformities, no cyanosis or clubbing, normal tone Neuro:  A&Ox3, CN II-XII intact, normal gait Skin:  Warm, no lesions/ rash   Wt Readings from Last 3 Encounters:  10/11/19 (!) 240 lb (108.9 kg)  10/05/19 (!) 243 lb (110.2 kg)  07/12/19 245 lb (111.1 kg)    Lab Results  Component Value Date   WBC 6.0 02/17/2019   HGB 12.0 02/17/2019   HCT 36.9 02/17/2019   PLT 302.0 02/17/2019   GLUCOSE 106 (H) 02/17/2019   CHOL 255 (H) 02/17/2019   TRIG 93.0 02/17/2019   HDL 47.30 02/17/2019   LDLCALC 189 (H) 02/17/2019   ALT 14 01/22/2017   AST 16 01/22/2017   NA 139 02/17/2019   K 4.2 02/17/2019   CL 105 02/17/2019   CREATININE 0.89 02/17/2019   BUN 18 02/17/2019   CO2 28 02/17/2019   TSH 0.646 01/06/2014   INR 1.07 05/02/2009   HGBA1C 5.9 (A) 07/12/2019   MICROALBUR 0.7 02/17/2019    Assessment/Plan:  SOB (shortness of breath)  -discussed possible causes including CHF, acute MI, PE, reactive airway dz, seasonal allergies, deconditioning, anemia -Lungs clear on exam but will obtain CXR given continued symptoms -We will obtain labs -  Plan: CBC with Differential/Platelets, Brain Natriuretic Peptide, D-dimer, CXR, EKG 12-Lead, Ambulatory referral to Cardiology  Chest pressure  -EKG with NSR.  Similar to previous study on 06/17/19 -Discussed obtaining labs and CXR. -We will place referral to cardiology for Holter monitor. -Given precautions and discussed S/S of MI. - Plan: CMP with eGFR([facility]), D-dimer, EKG 12-Lead, Magnesium, Ambulatory referral to Cardiology  Fatigue, unspecified type -Discussed various  causes including vitamin deficiencies, thyroid dysfunction, anemia, OSA -Orthostatics obtained and normal. -Discussed repeating sleep study.  Pt wishes to wait at this time. - Plan: Brain Natriuretic Peptide, CBC EKG 12-Lead, Vitamin D, 25-hydroxy, D-dimer, TSH, T4, Free, Hemoglobin A1c, Ambulatory referral to Cardiology  F/u prn  Grier Mitts, MD

## 2019-10-11 NOTE — Telephone Encounter (Signed)
Pt Rx was sent to pharmacy for refill

## 2019-10-12 ENCOUNTER — Ambulatory Visit (INDEPENDENT_AMBULATORY_CARE_PROVIDER_SITE_OTHER)
Admission: RE | Admit: 2019-10-12 | Discharge: 2019-10-12 | Disposition: A | Discharge status: Home / Self Care | Payer: BC Managed Care – PPO | Source: Ambulatory Visit | Attending: Family Medicine | Admitting: Family Medicine

## 2019-10-12 ENCOUNTER — Other Ambulatory Visit: Payer: BC Managed Care – PPO

## 2019-10-12 ENCOUNTER — Other Ambulatory Visit: Payer: Self-pay | Admitting: Family Medicine

## 2019-10-12 DIAGNOSIS — R0789 Other chest pain: Secondary | ICD-10-CM | POA: Diagnosis not present

## 2019-10-12 DIAGNOSIS — R5383 Other fatigue: Secondary | ICD-10-CM | POA: Diagnosis not present

## 2019-10-12 DIAGNOSIS — D649 Anemia, unspecified: Secondary | ICD-10-CM

## 2019-10-12 DIAGNOSIS — R7989 Other specified abnormal findings of blood chemistry: Secondary | ICD-10-CM

## 2019-10-12 DIAGNOSIS — R0602 Shortness of breath: Secondary | ICD-10-CM

## 2019-10-12 DIAGNOSIS — E559 Vitamin D deficiency, unspecified: Secondary | ICD-10-CM

## 2019-10-12 LAB — COMPLETE METABOLIC PANEL WITH GFR
AG Ratio: 0.9 (calc) — ABNORMAL LOW (ref 1.0–2.5)
ALT: 18 U/L (ref 6–29)
AST: 17 U/L (ref 10–35)
Albumin: 3.5 g/dL — ABNORMAL LOW (ref 3.6–5.1)
Alkaline phosphatase (APISO): 126 U/L (ref 37–153)
BUN/Creatinine Ratio: 11 (calc) (ref 6–22)
BUN: 12 mg/dL (ref 7–25)
CO2: 28 mmol/L (ref 20–32)
Calcium: 9.2 mg/dL (ref 8.6–10.4)
Chloride: 104 mmol/L (ref 98–110)
Creat: 1.07 mg/dL — ABNORMAL HIGH (ref 0.50–0.99)
GFR, Est African American: 64 mL/min/{1.73_m2} (ref >=60)
GFR, Est Non African American: 55 mL/min/{1.73_m2} — ABNORMAL LOW (ref >=60)
Globulin: 3.8 g/dL (calc) — ABNORMAL HIGH (ref 1.9–3.7)
Glucose, Bld: 99 mg/dL (ref 65–99)
Potassium: 4.5 mmol/L (ref 3.5–5.3)
Sodium: 140 mmol/L (ref 135–146)
Total Bilirubin: 0.3 mg/dL (ref 0.2–1.2)
Total Protein: 7.3 g/dL (ref 6.1–8.1)

## 2019-10-12 LAB — D-DIMER, QUANTITATIVE: D-Dimer, Quant: 1.3 mcg/mL FEU — ABNORMAL HIGH (ref <0.50)

## 2019-10-12 LAB — CBC WITH DIFFERENTIAL/PLATELET
Absolute Monocytes: 861 cells/uL (ref 200–950)
Basophils Absolute: 61 cells/uL (ref 0–200)
Basophils Relative: 0.7 %
Eosinophils Absolute: 139 cells/uL (ref 15–500)
Eosinophils Relative: 1.6 %
HCT: 33 % — ABNORMAL LOW (ref 35.0–45.0)
Hemoglobin: 10.5 g/dL — ABNORMAL LOW (ref 11.7–15.5)
Lymphs Abs: 2540 cells/uL (ref 850–3900)
MCH: 28.5 pg (ref 27.0–33.0)
MCHC: 31.8 g/dL — ABNORMAL LOW (ref 32.0–36.0)
MCV: 89.7 fL (ref 80.0–100.0)
MPV: 10.2 fL (ref 7.5–12.5)
Monocytes Relative: 9.9 %
Neutro Abs: 5098 cells/uL (ref 1500–7800)
Neutrophils Relative %: 58.6 %
Platelets: 499 10*3/uL — ABNORMAL HIGH (ref 140–400)
RBC: 3.68 10*6/uL — ABNORMAL LOW (ref 3.80–5.10)
RDW: 13.4 % (ref 11.0–15.0)
Total Lymphocyte: 29.2 %
WBC: 8.7 10*3/uL (ref 3.8–10.8)

## 2019-10-12 LAB — HEMOGLOBIN A1C
Hgb A1c MFr Bld: 6.2 % of total Hgb — ABNORMAL HIGH (ref <5.7)
Mean Plasma Glucose: 131 (calc)
eAG (mmol/L): 7.3 (calc)

## 2019-10-12 LAB — VITAMIN D 25 HYDROXY (VIT D DEFICIENCY, FRACTURES): Vit D, 25-Hydroxy: 11 ng/mL — ABNORMAL LOW (ref 30–100)

## 2019-10-12 LAB — BRAIN NATRIURETIC PEPTIDE: Brain Natriuretic Peptide: 80 pg/mL (ref <100)

## 2019-10-12 LAB — T4, FREE: Free T4: 1.1 ng/dL (ref 0.8–1.8)

## 2019-10-12 LAB — TSH: TSH: 0.86 mIU/L (ref 0.40–4.50)

## 2019-10-12 LAB — MAGNESIUM: Magnesium: 2.3 mg/dL (ref 1.5–2.5)

## 2019-10-12 MED ORDER — VITAMIN D (ERGOCALCIFEROL) 1.25 MG (50000 UNIT) PO CAPS: 50000.0000 [IU] | ORAL_CAPSULE | ORAL | 0 refills | Status: DC

## 2019-10-12 MED ORDER — FERROUS SULFATE 325 (65 FE) MG PO TBEC: 325.0000 mg | DELAYED_RELEASE_TABLET | Freq: Every day | ORAL | 3 refills | Status: DC

## 2019-10-13 ENCOUNTER — Telehealth: Payer: Self-pay

## 2019-10-13 ENCOUNTER — Ambulatory Visit: Payer: BC Managed Care – PPO | Admitting: Family Medicine

## 2019-10-13 ENCOUNTER — Ambulatory Visit (INDEPENDENT_AMBULATORY_CARE_PROVIDER_SITE_OTHER)
Admission: RE | Admit: 2019-10-13 | Discharge: 2019-10-13 | Disposition: A | Discharge status: Home / Self Care | Payer: BC Managed Care – PPO | Source: Ambulatory Visit | Attending: Family Medicine | Admitting: Family Medicine

## 2019-10-13 ENCOUNTER — Other Ambulatory Visit: Payer: Self-pay

## 2019-10-13 DIAGNOSIS — R7989 Other specified abnormal findings of blood chemistry: Secondary | ICD-10-CM | POA: Diagnosis not present

## 2019-10-13 MED ORDER — IOHEXOL 350 MG/ML SOLN
80.0000 mL | Freq: Once | INTRAVENOUS | Status: AC | PRN
  Administered 2019-10-13: 80 mL via INTRAVENOUS

## 2019-10-13 NOTE — Telephone Encounter (Signed)
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

## 2019-10-13 NOTE — Telephone Encounter (Signed)
Spoke with pt explained the importance of having STAT done due to elevated d-dimer, pt agreed to going for the CT today, [place] CT was able to schedule pt for this afternoon.

## 2019-10-14 ENCOUNTER — Inpatient Hospital Stay: Admission: RE | Admit: 2019-10-14 | Payer: BC Managed Care – PPO | Source: Ambulatory Visit

## 2019-10-21 ENCOUNTER — Encounter: Payer: Self-pay | Admitting: General Practice

## 2020-02-02 ENCOUNTER — Encounter: Payer: Self-pay | Admitting: Internal Medicine

## 2020-08-15 ENCOUNTER — Telehealth: Payer: Self-pay | Admitting: Family Medicine

## 2020-08-15 NOTE — Telephone Encounter (Signed)
Pt call and stated she need a refill on   metFORMIN (GLUCOPHAGE) 500 MG tablet sent to  [place], [place] - [address] Phone:  [phone]  Fax:  [phone]

## 2020-08-16 ENCOUNTER — Other Ambulatory Visit: Payer: Self-pay

## 2020-08-16 DIAGNOSIS — E119 Type 2 diabetes mellitus without complications: Secondary | ICD-10-CM

## 2020-08-16 MED ORDER — METFORMIN HCL 500 MG PO TABS: 500.0000 mg | ORAL_TABLET | Freq: Two times a day (BID) | ORAL | 0 refills | Status: DC

## 2020-08-16 NOTE — Telephone Encounter (Signed)
Refill sent.

## 2020-09-06 ENCOUNTER — Other Ambulatory Visit: Payer: Self-pay

## 2020-09-07 ENCOUNTER — Ambulatory Visit (INDEPENDENT_AMBULATORY_CARE_PROVIDER_SITE_OTHER)
Admission: RE | Admit: 2020-09-07 | Discharge: 2020-09-07 | Disposition: A | Discharge status: Home / Self Care | Payer: BC Managed Care – PPO | Source: Ambulatory Visit | Attending: Family Medicine | Admitting: Family Medicine

## 2020-09-07 ENCOUNTER — Ambulatory Visit: Payer: BC Managed Care – PPO | Admitting: Family Medicine

## 2020-09-07 ENCOUNTER — Encounter: Payer: Self-pay | Admitting: Family Medicine

## 2020-09-07 VITALS — BP 124/72 | HR 78 | Temp 98.2°F | Ht 62.0 in | Wt 253.0 lb

## 2020-09-07 DIAGNOSIS — M25551 Pain in right hip: Secondary | ICD-10-CM

## 2020-09-07 DIAGNOSIS — L309 Dermatitis, unspecified: Secondary | ICD-10-CM

## 2020-09-07 MED ORDER — CYCLOBENZAPRINE HCL 5 MG PO TABS: 5.0000 mg | ORAL_TABLET | Freq: Every evening | ORAL | 0 refills | Status: DC | PRN

## 2020-09-07 MED ORDER — DICLOFENAC SODIUM 1 % EX GEL: 4.0000 g | Freq: Four times a day (QID) | CUTANEOUS | 0 refills | Status: DC

## 2020-09-07 NOTE — Progress Notes (Signed)
Subjective:    Patient ID: Caroline Velez, female    DOB: 1956/01/09, 65 y.o.   MRN: [id number]  Chief Complaint  Patient presents with   right hip pain    Started last week and Fingers dry around cuticles    HPI Patient was seen today for acute concern.  Patient endorses right hip pain x1-2 weeks.  Endorses hearing a popping sound in the right hip while standing folding clothes.  Pain/pressure in right hip with walking.  Patient denies loss of bowel or bladder, back pain, edema, weakness in RLE.  Tried OTC meds and muscle relaxer.  Patient also mentions dryness of skin around fingertips with mild pruritus.  Patient using hand sanitizer frequently throughout the day.  Also washing dishes without wearing gloves.  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    Family History  Problem Relation Age of Onset   Cancer Mother        breast   Hypertension Father      No Known Allergies  ROS General: Denies fever, chills, night sweats, changes in weight, changes in appetite HEENT: Denies headaches, ear pain, changes in vision, rhinorrhea, sore throat CV: Denies CP, palpitations, SOB, orthopnea Pulm: Denies SOB, cough, wheezing GI: Denies abdominal pain, nausea, vomiting, diarrhea, constipation GU: Denies dysuria, hematuria, frequency, vaginal discharge Msk: Denies muscle cramps, joint pains + right hip pain Neuro: Denies weakness, numbness, tingling Skin: Denies rashes, bruising +dry skin and pruritis of fingers Psych: Denies depression, anxiety, hallucinations    Objective:    Blood pressure 124/72, pulse 78, temperature 98.2 F (36.8 C), temperature source Oral, height 5\' 2"  (1.575 m), weight 253 lb (114.8 kg), SpO2 98 %.   Gen. Pleasant, well-nourished, in no distress, normal affect   HEENT: [place]/AT, face symmetric, conjunctiva clear, no scleral  icterus, PERRLA, EOMI, nares patent without drainage Lungs: no accessory muscle use Cardiovascular: RRR, no peripheral edema Musculoskeletal: No TTP of cervical, thoracic, lumbar, sacral spine or paraspinal muscles.  TTP of right hip.  Negative logroll, straight leg raise, FADIR, FABER bilaterally.  no deformities, no cyanosis or clubbing, normal tone Neuro:  A&Ox3, CN II-XII intact, normal gait Skin:  Warm, dry, intact.  Skin of fingertips of bilateral hands peeling and dry in appearance.  No erythema or drainage noted.  Normal appearing pain at fingernails.   Wt Readings from Last 3 Encounters:  09/07/20 253 lb (114.8 kg)  10/11/19 (!) 240 lb (108.9 kg)  10/05/19 (!) 243 lb (110.2 kg)    Lab Results  Component Value Date   WBC 8.7 10/11/2019   HGB 10.5 (L) 10/11/2019   HCT 33.0 (L) 10/11/2019   PLT 499 (H) 10/11/2019   GLUCOSE 99 10/11/2019   CHOL 255 (H) 02/17/2019   TRIG 93.0 02/17/2019   HDL 47.30 02/17/2019   LDLCALC 189 (H) 02/17/2019   ALT 18 10/11/2019   AST 17 10/11/2019   NA 140 10/11/2019   K 4.5 10/11/2019   CL 104 10/11/2019   CREATININE 1.07 (H) 10/11/2019   BUN 12 10/11/2019   CO2 28 10/11/2019   TSH 0.86 10/11/2019   INR 1.07 05/02/2009   HGBA1C 6.2 (H) 10/11/2019   MICROALBUR 0.7 02/17/2019    Assessment/Plan:  Right hip pain -Discussed possible causes including arthritis, labral tear, radiculopathy less likely. -Will obtain imaging -Continue supportive care including heat, massage, rest, stretching -  Voltaren gel 4 times daily as needed and muscle relaxer nightly as needed -Further recommendations based on imaging. -Given precautions - Plan: diclofenac Sodium (VOLTAREN) 1 % GEL, DG Hip Unilat W OR W/O Pelvis 2-3 Views Right, cyclobenzaprine (FLEXERIL) 5 MG tablet  Dermatitis -Likely 2/2 contact dermatitis from frequent use of hand sanitizer. -Okay to use cortisone cream as needed and a moisturizing hand cream -Advised to avoid washing dishes  without wearing gloves -Continue to monitor  F/u as needed  Grier Mitts, MD

## 2020-09-23 ENCOUNTER — Encounter: Payer: Self-pay | Admitting: Emergency Medicine

## 2020-09-23 ENCOUNTER — Ambulatory Visit
Admission: EM | Admit: 2020-09-23 | Discharge: 2020-09-23 | Disposition: A | Discharge status: Home / Self Care | Payer: BC Managed Care – PPO | Attending: Emergency Medicine | Admitting: Emergency Medicine

## 2020-09-23 ENCOUNTER — Other Ambulatory Visit: Payer: Self-pay

## 2020-09-23 DIAGNOSIS — R059 Cough, unspecified: Secondary | ICD-10-CM

## 2020-09-23 DIAGNOSIS — R0981 Nasal congestion: Secondary | ICD-10-CM | POA: Diagnosis not present

## 2020-09-23 MED ORDER — AMOXICILLIN-POT CLAVULANATE 875-125 MG PO TABS
1.0000 | ORAL_TABLET | Freq: Two times a day (BID) | ORAL | 0 refills | Status: AC
Start: 2020-09-23 — End: 2020-10-03

## 2020-09-23 NOTE — ED Provider Notes (Signed)
[place]   [id number] 09/23/20 Arrival Time: 58   CC: sinus congestion  SUBJECTIVE: History from: patient.  Caroline Velez is a 65 y.o. female who presents with non-productive cough and nasal congestion x 6 day.  Denies sick exposure to COVID, flu or strep.  Has tried OTC medications without relief.  Symptoms are made worse at night.  Reports previous symptoms in the past with sinus infection.   Complains of slight wheezes as well.  Denies fever, chills, fatigue, sore throat, SOB, wheezing, chest pain, nausea, changes in bowel or bladder habits.    ROS: As per HPI.  All other pertinent ROS negative.     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   Current Outpatient Medications on File Prior to Encounter  Medication Sig Dispense Refill   aspirin-acetaminophen-caffeine (EXCEDRIN MIGRAINE) 250-250-65 MG per tablet Take 1 tablet by mouth every 6 (six) hours as needed for pain.     blood glucose meter kit and supplies Dispense based on patient and insurance preference. Use up to four times daily as directed. (FOR ICD-10 E10.9, E11.9). 1 each 0   cyclobenzaprine (FLEXERIL) 5 MG tablet Take 1 tablet (5 mg total) by mouth at bedtime as needed for muscle spasms. 30 tablet 0   diclofenac Sodium (VOLTAREN) 1 % GEL Apply 4 g topically 4 (four) times daily. 100 g 0   ferrous sulfate 325 (65 FE) MG EC tablet Take 1 tablet (325 mg total) by mouth daily with breakfast. 30 tablet 3   metFORMIN (GLUCOPHAGE) 500 MG tablet Take 1 tablet (500 mg total) by mouth 2 (two) times daily with a meal. 180 tablet 0   Vitamin D, Ergocalciferol, (DRISDOL) 1.25 MG (50000  UNIT) CAPS capsule Take 1 capsule (50,000 Units total) by mouth every 7 (seven) days. 12 capsule 0   Social History   Socioeconomic History   Marital status: Single    Spouse name: Not on file   Number of children: Not on file   Years of education: Not on file   Highest education level: Not on file  Occupational History   Not on file  Tobacco Use   Smoking status: Never   Smokeless tobacco: Never  Vaping Use   Vaping Use: Never used  Substance and Sexual Activity   Alcohol use: No   Drug use: No   Sexual activity: Not on file  Other Topics Concern   Not on file  Social History Narrative   Not on file   Social Determinants of Health   Financial Resource Strain: Not on file  Food Insecurity: Not on file  Transportation Needs: Not on file  Physical Activity: Not on file  Stress: Not on file  Social Connections: Not on file  Intimate Partner Violence: Not on file   Family History  Problem Relation Age of Onset   Cancer Mother        breast   Hypertension Father     OBJECTIVE:  Vitals:   09/23/20 1103  BP: 129/84  Pulse: 75  Resp: 15  Temp: 98.7 F (37.1 C)  SpO2: 97%    General appearance: alert;  mildly fatigued appearing; nontoxic; speaking in full sentences and tolerating own secretions HEENT: NCAT; Ears: EACs clear, TMs pearly gray; Eyes: PERRL.  EOM grossly intact.Nose: nares patent without rhinorrhea, Throat: oropharynx clear, tonsils non erythematous or enlarged, uvula midline  Neck: supple without LAD Lungs: unlabored respirations, symmetrical air entry; cough: absent; no respiratory distress; CTAB Heart: regular rate and rhythm.  Skin: warm and dry Psychological: alert and cooperative; normal mood and affect   ASSESSMENT & PLAN:  1. Sinus congestion   2. Cough     Meds ordered this encounter  Medications   amoxicillin-clavulanate (AUGMENTIN) 875-125 MG tablet    Sig: Take 1 tablet by mouth every 12 (twelve) hours for 10 days.    Dispense:   20 tablet    Refill:  0    Order Specific Question:   Supervising Provider    Answer:   Raylene Everts [[id number]]   Get plenty of rest and push fluids Augmentin prescribed.  Take as directed and to completion Use OTC zyrtec for nasal congestion, runny nose, and/or sore throat Use OTC flonase for nasal congestion and runny nose Use medications daily for symptom relief Use OTC medications like ibuprofen or tylenol as needed fever or pain Call or go to the ED if you have any new or worsening symptoms such as fever, worsening cough, shortness of breath, chest tightness, chest pain, turning blue, changes in mental status, etc...   Reviewed expectations re: course of current medical issues. Questions answered. Outlined signs and symptoms indicating need for more acute intervention. Patient verbalized understanding. After Visit Summary given.          Lestine Box, PA-C 09/23/20 1122

## 2020-09-23 NOTE — ED Triage Notes (Signed)
Patient states that she began having a cough- non productive and nasal congestion since Monday and now having some chest pressure/ slight wheezing. denies fever and headache.

## 2020-09-23 NOTE — Discharge Instructions (Addendum)
Get plenty of rest and push fluids Augmentin prescribed.  Take as directed and to completion Use OTC zyrtec for nasal congestion, runny nose, and/or sore throat Use OTC flonase for nasal congestion and runny nose Use medications daily for symptom relief Use OTC medications like ibuprofen or tylenol as needed fever or pain Call or go to the ED if you have any new or worsening symptoms such as fever, worsening cough, shortness of breath, chest tightness, chest pain, turning blue, changes in mental status, etc..Marland Kitchen

## 2020-09-25 ENCOUNTER — Emergency Department (HOSPITAL_COMMUNITY)
Admission: EM | Admit: 2020-09-25 | Discharge: 2020-09-25 | Disposition: A | Discharge status: Home / Self Care | Payer: BC Managed Care – PPO | Attending: Emergency Medicine | Admitting: Emergency Medicine

## 2020-09-25 ENCOUNTER — Emergency Department (HOSPITAL_COMMUNITY): Payer: BC Managed Care – PPO

## 2020-09-25 ENCOUNTER — Encounter (HOSPITAL_COMMUNITY): Payer: Self-pay | Admitting: *Deleted

## 2020-09-25 ENCOUNTER — Other Ambulatory Visit: Payer: Self-pay

## 2020-09-25 DIAGNOSIS — J40 Bronchitis, not specified as acute or chronic: Secondary | ICD-10-CM | POA: Diagnosis not present

## 2020-09-25 DIAGNOSIS — Z20822 Contact with and (suspected) exposure to covid-19: Secondary | ICD-10-CM | POA: Diagnosis not present

## 2020-09-25 DIAGNOSIS — R911 Solitary pulmonary nodule: Secondary | ICD-10-CM | POA: Diagnosis not present

## 2020-09-25 DIAGNOSIS — R079 Chest pain, unspecified: Secondary | ICD-10-CM | POA: Diagnosis present

## 2020-09-25 DIAGNOSIS — I10 Essential (primary) hypertension: Secondary | ICD-10-CM | POA: Diagnosis not present

## 2020-09-25 DIAGNOSIS — E119 Type 2 diabetes mellitus without complications: Secondary | ICD-10-CM | POA: Diagnosis not present

## 2020-09-25 DIAGNOSIS — Z7984 Long term (current) use of oral hypoglycemic drugs: Secondary | ICD-10-CM | POA: Insufficient documentation

## 2020-09-25 DIAGNOSIS — Z7982 Long term (current) use of aspirin: Secondary | ICD-10-CM | POA: Insufficient documentation

## 2020-09-25 DIAGNOSIS — R0789 Other chest pain: Secondary | ICD-10-CM

## 2020-09-25 LAB — CBC
HCT: 38 % (ref 36.0–46.0)
Hemoglobin: 11.9 g/dL — ABNORMAL LOW (ref 12.0–15.0)
MCH: 28.6 pg (ref 26.0–34.0)
MCHC: 31.3 g/dL (ref 30.0–36.0)
MCV: 91.3 fL (ref 80.0–100.0)
Platelets: 344 10*3/uL (ref 150–400)
RBC: 4.16 MIL/uL (ref 3.87–5.11)
RDW: 14.6 % (ref 11.5–15.5)
WBC: 7.3 10*3/uL (ref 4.0–10.5)
nRBC: 0 % (ref 0.0–0.2)

## 2020-09-25 LAB — BASIC METABOLIC PANEL
Anion gap: 9 (ref 5–15)
BUN: 11 mg/dL (ref 8–23)
CO2: 28 mmol/L (ref 22–32)
Calcium: 9.3 mg/dL (ref 8.9–10.3)
Chloride: 101 mmol/L (ref 98–111)
Creatinine, Ser: 0.98 mg/dL (ref 0.44–1.00)
GFR, Estimated: >60 mL/min (ref >60)
Glucose, Bld: 108 mg/dL — ABNORMAL HIGH (ref 70–99)
Potassium: 3.8 mmol/L (ref 3.5–5.1)
Sodium: 138 mmol/L (ref 135–145)

## 2020-09-25 LAB — RESP PANEL BY RT-PCR (FLU A&B, COVID) ARPGX2
Influenza A by PCR: NEGATIVE
Influenza B by PCR: NEGATIVE
SARS Coronavirus 2 by RT PCR: NEGATIVE

## 2020-09-25 LAB — TROPONIN I (HIGH SENSITIVITY)
Troponin I (High Sensitivity): 2 ng/L (ref <18)
Troponin I (High Sensitivity): 3 ng/L (ref <18)

## 2020-09-25 MED ORDER — ALBUTEROL SULFATE HFA 108 (90 BASE) MCG/ACT IN AERS
6.0000 | INHALATION_SPRAY | Freq: Once | RESPIRATORY_TRACT | Status: AC
  Administered 2020-09-25: 6 via RESPIRATORY_TRACT
  Filled 2020-09-25: qty 6.7

## 2020-09-25 MED ORDER — FLUTICASONE PROPIONATE 50 MCG/ACT NA SUSP
2.0000 | Freq: Every day | NASAL | 0 refills | Status: DC
Start: 2020-09-25 — End: 2022-10-02

## 2020-09-25 MED ORDER — BENZONATATE 100 MG PO CAPS
100.0000 mg | ORAL_CAPSULE | Freq: Three times a day (TID) | ORAL | 0 refills | Status: AC
Start: 2020-09-25 — End: 2020-09-30

## 2020-09-25 MED ORDER — AEROCHAMBER PLUS FLO-VU MEDIUM MISC
1.0000 | Freq: Once | Status: DC
  Filled 2020-09-25: qty 1

## 2020-09-25 MED ORDER — DEXAMETHASONE SODIUM PHOSPHATE 10 MG/ML IJ SOLN
10.0000 mg | Freq: Once | INTRAMUSCULAR | Status: AC
  Administered 2020-09-25: 10 mg via INTRAMUSCULAR
  Filled 2020-09-25: qty 1

## 2020-09-25 NOTE — ED Triage Notes (Signed)
Pt c/o chest pain x 3 days; pt states she went to urgent care and was given meds for URI; pt states she took a baby asa prior to arrival

## 2020-09-25 NOTE — Discharge Instructions (Addendum)
Your covid test was negative  Your chest x-ray today showed bronchitis.  You were treated with a steroid in the emergency department as well as albuterol.  Recommend using 2 puffs of the albuterol inhaler every 4-6 hours as needed for wheezing or cough.  You are also given Tessalon to help with your cough and fluticasone for your postnasal drip and congestion.    You were noted to have a possible lung nodule on your chest x-ray and I recommend that you follow-up with your regular doctor as you will likely need to have a CT scan completed to further evaluate this.  If you have any new or worsening symptoms in the meantime please return to the emergency department.

## 2020-09-25 NOTE — ED Notes (Signed)
rec'd d/c papers ambulatory to lobby

## 2020-09-25 NOTE — ED Provider Notes (Signed)
[facility] EMERGENCY DEPARTMENT Provider Note   CSN: [id number] Arrival date & time: 09/25/20  1558     History Chief Complaint  Patient presents with   Chest Pain    Caroline Velez is a 65 y.o. female.  HPI   Pt is a 65 y/o female with a h/o allergies, DM, HLD, meningitis due to haemophilus influenza who presents to the ED today for eval of chest pain and cough.  Patient states she developed congestion, cough, rhinorrhea, postnasal drip about 8 days ago.  She was seen at urgent care and she was diagnosed with a sinus infection and started on Augmentin 2 days ago.  She states that she still having some persistent symptoms and she cannot get rid of the cough which is really bothering her.  She has a chest pressure located in the middle of her chest.  Seems to get worse with coughing and laying flat.  She has some mild associated shortness of breath.  There is no bilateral lower extremity swelling.  No recent extended periods of travel, trauma, admissions to the hospital or extended periods of immobility.  Denies any estrogen use.  Denies any history of VTE or cancer.  Denies history.  No early family history of heart disease.  Pain is not exertional.  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      Family History  Problem Relation Age of Onset   Cancer Mother        breast   Hypertension Father     Social History   Tobacco Use   Smoking status: Never   Smokeless tobacco: Never  Vaping Use   Vaping Use: Never used  Substance Use Topics   Alcohol use: No   Drug use: No    Home Medications Prior to Admission medications   Medication Sig Start Date End Date Taking? Authorizing Provider  amoxicillin-clavulanate (AUGMENTIN) 875-125 MG tablet Take 1 tablet by mouth every 12 (twelve) hours for 10 days. 09/23/20 10/03/20 Yes Wurst, [place], PA-C  aspirin EC 81 MG tablet Take 81 mg by mouth daily. Swallow whole.   Yes [provider]  aspirin-acetaminophen-caffeine (EXCEDRIN MIGRAINE) [phone] MG per tablet Take 1 tablet by mouth every 6 (six) hours as needed for pain.   Yes [provider]  benzonatate (TESSALON) 100 MG capsule Take 1 capsule (100 mg total) by mouth every 8 (eight) hours for 5 days. 09/25/20 09/30/20 Yes Shaniquia Brafford S, PA-C  cyclobenzaprine (FLEXERIL) 5 MG tablet Take 1 tablet (5 mg total) by mouth at bedtime as needed for muscle spasms. 09/07/20  Yes Billie Ruddy, MD  diclofenac Sodium (VOLTAREN) 1 % GEL Apply 4 g topically 4 (four) times daily. 09/07/20  Yes Banks,  Langley Adie, MD  fluticasone (FLONASE) 50 MCG/ACT nasal spray Place 2 sprays into both nostrils daily. 09/25/20  Yes Ahyana Skillin S, PA-C  metFORMIN (GLUCOPHAGE) 500 MG tablet Take 1 tablet (500 mg total) by mouth 2 (two) times daily with a meal. 08/16/20  Yes Billie Ruddy, MD  blood glucose meter kit and supplies Dispense based on patient and insurance preference. Use up to four times daily as directed. (FOR ICD-10 E10.9, E11.9). 07/12/19   Billie Ruddy, MD  ferrous sulfate 325 (65 FE) MG EC tablet Take 1 tablet (325 mg total) by mouth daily with breakfast. Patient not taking: Reported on 09/25/2020 10/12/19   Billie Ruddy, MD  Vitamin D, Ergocalciferol, (DRISDOL) 1.25 MG  (50000 UNIT) CAPS capsule Take 1 capsule (50,000 Units total) by mouth every 7 (seven) days. Patient not taking: Reported on 09/25/2020 10/12/19   Billie Ruddy, MD    Allergies    Patient has no known allergies.  Review of Systems   Review of Systems  Constitutional:  Negative for chills and fever.  HENT:  Positive for congestion, postnasal drip and rhinorrhea. Negative for ear pain and sore throat.   Eyes:  Negative for pain and visual disturbance.  Respiratory:  Positive for cough and shortness of breath.   Cardiovascular:  Negative for chest pain and palpitations.  Gastrointestinal:  Negative for abdominal pain, constipation, diarrhea, nausea and vomiting.  Genitourinary:  Negative for dysuria and hematuria.  Musculoskeletal:  Negative for back pain.  Skin:  Negative for color change and rash.  Neurological:  Negative for syncope.  All other systems reviewed and are negative.  Physical Exam Updated Vital Signs BP (!) 157/94   Pulse 78   Temp 98.4 F (36.9 C) (Oral)   Resp 20   Ht '5\' 2"'  (1.575 m)   Wt 114.8 kg   SpO2 100%   BMI 46.27 kg/m   Physical Exam Vitals and nursing note reviewed.  Constitutional:      General: She is not in acute distress.    Appearance: She is well-developed.  HENT:     Head: Normocephalic and atraumatic.  Eyes:     Conjunctiva/sclera: Conjunctivae normal.  Cardiovascular:     Rate and Rhythm: Normal rate and regular rhythm.     Heart sounds: No murmur heard. Pulmonary:     Effort: Pulmonary effort is normal. No tachypnea or respiratory distress.     Breath sounds: Wheezing present.  Chest:     Chest wall: Tenderness (anterior mid chest wall ttp that reproduces pain) present.  Abdominal:     Palpations: Abdomen is soft.     Tenderness: There is no abdominal tenderness. There is no guarding or rebound.  Musculoskeletal:     Cervical back: Neck supple.     Right lower leg: No tenderness. No edema.     Left lower leg: No tenderness.  No edema.  Skin:    General: Skin is warm and dry.  Neurological:     Mental Status: She is alert.    ED Results / Procedures / Treatments   Labs (all labs ordered are listed, but only abnormal results are displayed) Labs Reviewed  BASIC METABOLIC PANEL - Abnormal; Notable for the following components:      Result Value   Glucose, Bld 108 (*)    All other components within normal limits  CBC - Abnormal; Notable for the following components:   Hemoglobin 11.9 (*)    All other components within  normal limits  RESP PANEL BY RT-PCR (FLU A&B, COVID) [id number]  TROPONIN I (HIGH SENSITIVITY)  TROPONIN I (HIGH SENSITIVITY)    EKG EKG Interpretation  Date/Time:  Monday September 25 2020 16:11:47 EDT Ventricular Rate:  84 PR Interval:  130 QRS Duration: 64 QT Interval:  338 QTC Calculation: 399 R Axis:   -1 Text Interpretation: Normal sinus rhythm Minimal voltage criteria for LVH, may be normal variant ( R in aVL ) Possible Anterior infarct , age undetermined Abnormal ECG since last tracing no significant change Confirmed by Daleen Bo [phone]) on 09/25/2020 4:44:42 PM  Radiology DG Chest 2 View  Result Date: 09/25/2020 CLINICAL DATA:  Chest pain for 3 days EXAM: CHEST - 2 VIEW COMPARISON:  10/12/2019 FINDINGS: Borderline enlargement of cardiac silhouette. Mediastinal contours and pulmonary vascularity normal. Chronic peribronchial thickening with new nodular density in the LEFT upper lobe, 17 x 6 mm. No pulmonary infiltrate, pleural effusion, or pneumothorax. Multilevel endplate spur formation thoracic spine. IMPRESSION: Chronic bronchitic changes with new 17 x 6 mm nodular density in the LEFT upper lobe, questionably calcified but not seen on prior exam; CT chest recommended to exclude pulmonary nodule. Electronically Signed   By: Lavonia Dana M.D.   On: 09/25/2020 17:00    Procedures Procedures   Medications Ordered in ED Medications  AeroChamber Plus Flo-Vu Medium MISC 1 each (1 each  Other Not Given 09/25/20 1917)  albuterol (VENTOLIN HFA) 108 (90 Base) MCG/ACT inhaler 6 puff (6 puffs Inhalation Given 09/25/20 1838)  dexamethasone (DECADRON) injection 10 mg (10 mg Intramuscular Given 09/25/20 1838)    ED Course  I have reviewed the triage vital signs and the nursing notes.  Pertinent labs & imaging results that were available during my care of the patient were reviewed by me and considered in my medical decision making (see chart for details).    MDM Rules/Calculators/A&P                          65 year old female presents the emergency department today for evaluation of chest pain.  She has had URI symptoms for the last week which she has been treated for with antibiotics.  She describes the pain as a pressure located to the middle of her chest.  It is nonexertional.  There is no pleuritic nature.  There are no risk factors for PE.  Reviewed/interpreted lab. CBC with no leukocytosis, mild anemia BMP unremarkable Troponin negative  EKG - Normal sinus rhythm Minimal voltage criteria for LVH, may be normal variant ( R in aVL ) Possible Anterior infarct , age undetermined Abnormal ECG since last tracing no significant change  CXR -  Chronic bronchitic changes with new 17 x 6 mm nodular density in the LEFT upper lobe, questionably calcified but not seen on prior exam; CT chest recommended to exclude pulmonary nodule.   Patient was given albuterol and Decadron in the ED.  I suspect her symptoms are related to bronchitis, doubt ACS.  She may also have some chest wall pain due to her cough.  She was informed of the abnormality on her chest x-ray and need for follow-up.  I did also recommend she follow-up with PCP in regards to her symptoms return to the ED for new or worsening condition.  She does not appear to require admission at this time.  Final Clinical Impression(s) / ED Diagnoses Final diagnoses:  Atypical chest pain  Lung nodule  Bronchitis    Rx /  DC Orders ED  Discharge Orders          Ordered    fluticasone (FLONASE) 50 MCG/ACT nasal spray  Daily        09/25/20 1946    benzonatate (TESSALON) 100 MG capsule  Every 8 hours        09/25/20 1946             Bishop Dublin 09/25/20 1948    Milton Ferguson, MD 09/28/20 1018

## 2020-09-28 ENCOUNTER — Telehealth: Payer: BC Managed Care – PPO | Admitting: Family Medicine

## 2020-10-09 ENCOUNTER — Telehealth: Payer: Self-pay | Admitting: Emergency Medicine

## 2020-10-09 MED ORDER — FLUCONAZOLE 150 MG PO TABS: 150.0000 mg | ORAL_TABLET | Freq: Every day | ORAL | 0 refills | Status: DC

## 2020-10-09 NOTE — Telephone Encounter (Signed)
given abx here on 07/16.  states she has a yeast infection now

## 2020-10-16 ENCOUNTER — Ambulatory Visit: Payer: BC Managed Care – PPO | Admitting: Family Medicine

## 2020-10-20 ENCOUNTER — Other Ambulatory Visit: Payer: Self-pay

## 2020-10-23 ENCOUNTER — Encounter: Payer: Self-pay | Admitting: Family Medicine

## 2020-10-23 ENCOUNTER — Ambulatory Visit: Payer: BC Managed Care – PPO | Admitting: Family Medicine

## 2020-10-23 ENCOUNTER — Other Ambulatory Visit: Payer: Self-pay

## 2020-10-23 ENCOUNTER — Ambulatory Visit (INDEPENDENT_AMBULATORY_CARE_PROVIDER_SITE_OTHER)
Admission: RE | Admit: 2020-10-23 | Discharge: 2020-10-23 | Disposition: A | Discharge status: Home / Self Care | Payer: BC Managed Care – PPO | Source: Ambulatory Visit | Attending: Family Medicine | Admitting: Family Medicine

## 2020-10-23 VITALS — BP 118/82 | HR 75 | Temp 98.4°F | Wt 252.0 lb

## 2020-10-23 DIAGNOSIS — R9389 Abnormal findings on diagnostic imaging of other specified body structures: Secondary | ICD-10-CM

## 2020-10-23 NOTE — Progress Notes (Signed)
Subjective:    Patient ID: Caroline Velez, female    DOB: 05-Feb-1956, 65 y.o.   MRN: [id number]  Chief Complaint  Patient presents with   Follow-up    Went to ED and they said she has a spot on her lungs.    HPI Patient was seen today for follow-up on abnormal chest x-ray from ED on 09/25/2020.  CXR with a new 17 x 6 mm nodular density in LUL.  Patient without SOB, CP, wheezing, fever, chills.  The patient is a non-smoker.  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     No Known Allergies  ROS General: Denies fever, chills, night sweats, changes in weight, changes in appetite HEENT: Denies headaches, ear pain, changes in vision, rhinorrhea, sore throat CV: Denies CP, palpitations, SOB, orthopnea Pulm: Denies SOB, cough, wheezing GI: Denies abdominal pain, nausea, vomiting, diarrhea, constipation GU: Denies dysuria, hematuria, frequency, vaginal discharge Msk: Denies muscle cramps, joint pains Neuro: Denies weakness, numbness, tingling Skin: Denies rashes, bruising Psych: Denies depression, anxiety, hallucinations     Objective:    Blood pressure 118/82, pulse 75, temperature 98.4 F (36.9 C), temperature source Oral, weight 252 lb (114.3 kg), SpO2 98 %.  Gen. Pleasant, well-nourished, in no distress, normal affect   HEENT: /AT, face symmetric, conjunctiva clear, no scleral icterus, PERRLA, EOMI, nares patent without drainage Lungs: no accessory muscle use Cardiovascular: RRR, no peripheral edema Musculoskeletal: No deformities, no cyanosis or clubbing, normal tone Neuro:  A&Ox3, CN II-XII intact, normal gait Skin:  Warm, no lesions/ rash  Wt Readings from Last 3 Encounters:  10/23/20 252 lb (114.3 kg)  09/25/20 253 lb (114.8 kg)  09/07/20 253 lb (114.8 kg)    Lab Results  Component Value Date   WBC 7.3 09/25/2020   HGB 11.9 (L)  09/25/2020   HCT 38.0 09/25/2020   PLT 344 09/25/2020   GLUCOSE 108 (H) 09/25/2020   CHOL 255 (H) 02/17/2019   TRIG 93.0 02/17/2019   HDL 47.30 02/17/2019   LDLCALC 189 (H) 02/17/2019   ALT 18 10/11/2019   AST 17 10/11/2019   NA 138 09/25/2020   K 3.8 09/25/2020   CL 101 09/25/2020   CREATININE 0.98 09/25/2020   BUN 11 09/25/2020   CO2 28 09/25/2020   TSH 0.86 10/11/2019   INR 1.07 05/02/2009   HGBA1C 6.2 (H) 10/11/2019   MICROALBUR 0.7 02/17/2019    Assessment/Plan:  Abnormal chest x-ray  -CXR on 09/25/2020 with new 17 x 6 mm nodule density in the LUL.  CT recommended. -CT chest scheduled today  -further recs based on results - Plan: CT Chest Wo Contrast  F/u prn  Grier Mitts, MD

## 2020-10-24 ENCOUNTER — Encounter: Payer: Self-pay | Admitting: Family Medicine

## 2021-01-05 ENCOUNTER — Other Ambulatory Visit: Payer: Self-pay | Admitting: Family Medicine

## 2021-01-05 DIAGNOSIS — E119 Type 2 diabetes mellitus without complications: Secondary | ICD-10-CM

## 2021-02-28 ENCOUNTER — Telehealth (INDEPENDENT_AMBULATORY_CARE_PROVIDER_SITE_OTHER): Payer: BC Managed Care – PPO | Admitting: Family Medicine

## 2021-02-28 ENCOUNTER — Encounter: Payer: Self-pay | Admitting: Family Medicine

## 2021-02-28 DIAGNOSIS — R062 Wheezing: Secondary | ICD-10-CM | POA: Diagnosis not present

## 2021-02-28 DIAGNOSIS — R051 Acute cough: Secondary | ICD-10-CM

## 2021-02-28 MED ORDER — BENZONATATE 100 MG PO CAPS: 100.0000 mg | ORAL_CAPSULE | Freq: Two times a day (BID) | ORAL | 0 refills | Status: DC | PRN

## 2021-02-28 MED ORDER — PREDNISONE 20 MG PO TABS: 40.0000 mg | ORAL_TABLET | Freq: Every day | ORAL | 0 refills | Status: AC

## 2021-02-28 NOTE — Progress Notes (Signed)
Virtual Visit via Telephone Note  I connected with Shareece Bultman Shenoy on 02/28/21 at  8:30 AM EST by telephone and verified that I am speaking with the correct person using two identifiers.   I discussed the limitations, risks, security and privacy concerns of performing an evaluation and management service by telephone and the availability of in person appointments. I also discussed with the patient that there may be a patient responsible charge related to this service. The patient expressed understanding and agreed to proceed.  Location patient: home Location provider: work or home office Participants present for the call: patient, provider Patient did not have a visit in the prior 7 days to address this/these issue(s). Chief Complaint  Patient presents with   Cough    For 2 weeks, congestion in chest sometimes is productive. Has taken mucinex, and sinus medication      History of Present Illness: Pt is a 65 year old female with pmh sig for HTN, DM, history of meningitis HLD, seasonal allergies who was seen for acute concern.  Patient with a cough x 1-2 wks ago.  Thought symptoms were allergy or sinus related.  Took OTC sinus medication which helped the rhinorrhea, but the cough continued.  Cough is dry/hacky.  Chest hurts when coughing.  Cough worse in the evening.  Also noes wheezing.  Denies fever, HA, ST, ear pain/pressure, sick contacts.  Also took Mucinex, Alka seltzer day and night, orange juice  Pt had a negative COVID test last wk.   Observations/Objective: Patient sounds cheerful and well on the phone. I do not appreciate any SOB. Speech and thought processing are grossly intact. Patient reported vitals:  Assessment and Plan: Acute cough - Plan: benzonatate (TESSALON) 100 MG capsule, predniSONE (DELTASONE) 20 MG tablet  Wheezing - Plan: predniSONE (DELTASONE) 20 MG tablet  Likely postviral cough from recent viral URI.  Continue supportive care.  Given wheezing and  continued cough we will start prednisone burst and Tessalon Per[place].  Patient advised prednisone likely to make blood sugar elevated.  Patient advised not to exceed the recommended daily amount of Alka-Seltzer as it contains aspirin.  Given strict precautions.  Follow-up as needed for continued or worsening symptoms  Follow Up Instructions: F/u prn   [id number] 5-10 [id number] 11-20 9443 21-30 I did not refer this patient for an OV in the next 24 hours for this/these issue(s).  I discussed the assessment and treatment plan with the patient. The patient was provided an opportunity to ask questions and all were answered. The patient agreed with the plan and demonstrated an understanding of the instructions.   The patient was advised to call back or seek an in-person evaluation if the symptoms worsen or if the condition fails to improve as anticipated.  I provided 9 minutes of non-face-to-face time during this encounter.   Billie Ruddy, MD

## 2021-03-01 ENCOUNTER — Telehealth: Payer: Self-pay | Admitting: Family Medicine

## 2021-03-01 NOTE — Telephone Encounter (Signed)
Pt had video visit with dr banks yesterday and she forgot to mention she has been having chest tightness for a wk. Pt went home and took covid test today and she is positive. Please advise. Pt would like antiviral medication  [place], [place] - [address] [place] [address] [place] Phone:  [phone]  Fax:  [phone]

## 2021-03-02 NOTE — Telephone Encounter (Signed)
Per Dr Volanda Napoleon, when speaking with pt on virtual visit, pt stated cough has been going on, can not factor the days to give antiviral.

## 2021-06-28 ENCOUNTER — Ambulatory Visit: Payer: BC Managed Care – PPO | Admitting: Family Medicine

## 2021-06-29 ENCOUNTER — Ambulatory Visit: Payer: Medicare PPO | Admitting: Family Medicine

## 2021-06-29 ENCOUNTER — Encounter: Payer: Self-pay | Admitting: Family Medicine

## 2021-06-29 VITALS — BP 109/68 | HR 68 | Temp 97.9°F | Wt 259.6 lb

## 2021-06-29 DIAGNOSIS — D171 Benign lipomatous neoplasm of skin and subcutaneous tissue of trunk: Secondary | ICD-10-CM

## 2021-06-29 DIAGNOSIS — N951 Menopausal and female climacteric states: Secondary | ICD-10-CM

## 2021-06-29 DIAGNOSIS — E119 Type 2 diabetes mellitus without complications: Secondary | ICD-10-CM

## 2021-06-29 DIAGNOSIS — R030 Elevated blood-pressure reading, without diagnosis of hypertension: Secondary | ICD-10-CM | POA: Diagnosis not present

## 2021-06-29 DIAGNOSIS — M62830 Muscle spasm of back: Secondary | ICD-10-CM | POA: Diagnosis not present

## 2021-06-29 LAB — BASIC METABOLIC PANEL
BUN: 12 mg/dL (ref 6–23)
CO2: 28 mEq/L (ref 19–32)
Calcium: 9.1 mg/dL (ref 8.4–10.5)
Chloride: 107 mEq/L (ref 96–112)
Creatinine, Ser: 0.99 mg/dL (ref 0.40–1.20)
GFR: 59.98 mL/min — ABNORMAL LOW (ref >60.00)
Glucose, Bld: 109 mg/dL — ABNORMAL HIGH (ref 70–99)
Potassium: 4.4 mEq/L (ref 3.5–5.1)
Sodium: 141 mEq/L (ref 135–145)

## 2021-06-29 LAB — LIPID PANEL
Cholesterol: 263 mg/dL — ABNORMAL HIGH (ref 0–200)
HDL: 41.3 mg/dL (ref >39.00)
LDL Cholesterol: 193 mg/dL — ABNORMAL HIGH (ref 0–99)
NonHDL: 221.75
Total CHOL/HDL Ratio: 6
Triglycerides: 143 mg/dL (ref 0.0–149.0)
VLDL: 28.6 mg/dL (ref 0.0–40.0)

## 2021-06-29 LAB — T4, FREE: Free T4: 0.81 ng/dL (ref 0.60–1.60)

## 2021-06-29 LAB — HEMOGLOBIN A1C: Hgb A1c MFr Bld: 6.8 % — ABNORMAL HIGH (ref 4.6–6.5)

## 2021-06-29 LAB — TSH: TSH: 0.75 u[IU]/mL (ref 0.35–5.50)

## 2021-06-29 MED ORDER — METFORMIN HCL 500 MG PO TABS: 500.0000 mg | ORAL_TABLET | Freq: Two times a day (BID) | ORAL | 2 refills | Status: DC

## 2021-06-29 NOTE — Progress Notes (Signed)
Subjective:  ? ? Patient ID: Caroline Velez, female    DOB: [birth year], 65 y.o.   MRN: [id number] ? ?Chief Complaint  ?Patient presents with  ? Back Pain  ?  Spasms for 1 week, just feeling down, no energy. Low back pain, comes and goes.   ?Bp check ?Lab work  ? ? ?HPI ?Patient is a 66 yo female with pmh sig for h/o HTN-diet controlled, DM II, HLD, seasonal allergies who was seen today for f/u and acute concern.  Pt states she has not taken any meds in 6 months.  States unable to find meds after moving.  Did not ask pharmacy for refills or clinic.  Not checking BP.  Eating everything.  Endorses HAs. ? ?Endorses R sided lower back spasms x 1 wk.  Occurs intermittently, not really a pain, just tightness.  Denies heavy lifting, pushing/pulling.  Tosses and turns at night due to hot flashes and freq urination.   ? ?Has not had mammogram.  Needs to schedule f/u colonoscopy. ? ?Past Medical History:  ?Diagnosis Date  ? Allergy   ? Diabetes mellitus without complication ([place])   ? Hyperlipidemia   ? 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 ? ?No Known Allergies ? ?ROS ?General: Denies fever, chills, night sweats, changes in weight, changes in appetite +hot flashes ?HEENT: Denies ear pain, changes in vision, rhinorrhea, sore throat +HAs ?CV: Denies CP, palpitations, SOB, orthopnea ?Pulm: Denies SOB, cough, wheezing ?GI: Denies abdominal pain, nausea, vomiting, diarrhea, constipation ?GU: Denies dysuria, hematuria, frequency, vaginal discharge ?Msk: Denies muscle cramps, joint pains +back spasms ?Neuro: Denies weakness, numbness, tingling ?Skin: Denies rashes, bruising ?Psych: Denies depression, anxiety, hallucinations ? ?   ?Objective:  ?  ?Blood pressure 109/68, pulse 68, temperature 97.9 ?F (36.6 ?C), temperature source Oral, weight 259 lb 9.6 oz (117.8 kg), SpO2 100 %. ? ?Gen. Pleasant, well-nourished, in no distress, normal affect   ?HEENT: [place]/AT, face symmetric, conjunctiva clear, no scleral icterus, PERRLA,  EOMI, nares patent without drainage ?Lungs: no accessory muscle use, CTAB, no wheezes or rales ?Cardiovascular: RRR, no m/r/g, no peripheral edema ?Musculoskeletal: No deformities, no cyanosis or clubbing, normal tone ?Neuro:  A&Ox3, CN II-XII intact, normal gait ?Skin:  Warm, no lesions/ rash.  8cm x 5 cm soft mobile mass in mid back to R of midline. ? ? ?Wt Readings from Last 3 Encounters:  ?06/29/21 259 lb 9.6 oz (117.8 kg)  ?10/23/20 252 lb (114.3 kg)  ?09/25/20 253 lb (114.8 kg)  ? ? ?Lab Results  ?Component Value Date  ? WBC 7.3 09/25/2020  ? HGB 11.9 (L) 09/25/2020  ? HCT 38.0 09/25/2020  ? PLT 344 09/25/2020  ? GLUCOSE 108 (H) 09/25/2020  ? CHOL 255 (H) 02/17/2019  ? TRIG 93.0 02/17/2019  ? HDL 47.30 02/17/2019  ? LDLCALC 189 (H) 02/17/2019  ? ALT 18 10/11/2019  ? AST 17 10/11/2019  ? NA 138 09/25/2020  ? K 3.8 09/25/2020  ? CL 101 09/25/2020  ? CREATININE 0.98 09/25/2020  ? BUN 11 09/25/2020  ? CO2 28 09/25/2020  ? TSH 0.86 10/11/2019  ? INR 1.07 05/02/2009  ? HGBA1C 6.2 (H) 10/11/2019  ? MICROALBUR 0.7 02/17/2019  ? ? ?Assessment/Plan: ? ?Elevated blood pressure reading ?-Initial BP reading 150/78 ?-recheck bp, 109/68 ?-h/o HTN noted in 2015 per chart review ?-previously diet controlled. ?-BP on 10/23/20 was 118/82 ?-lifestyle modifications strongly encouraged. ?-pt to check bp at home.  For readings consistently greater than 140/90 will start medication ?-  Given handout ?- Plan: Basic metabolic panel, TSH, T4, Free, Lipid panel ? ?Muscle spasm of back ?-Possibly 2/2 muscle strain from increased tossing and turning at night ?-Discussed supportive care including Tylenol or NSAIDs, topical analgesics such as Biofreeze, IcyHot, Aspercreme, heat, ice, stretching, massage, etc. ?-Given back exercises ? ?Controlled type 2 diabetes mellitus without complication, without long-term current use of insulin ([place])  ?-Hemoglobin A1c 6.2% on 10/11/2019 ?-Restart metformin ?-Discussed the importance of lifestyle  modifications ?-Foot exam at next [facility] ?-Patient advised to schedule diabetic retinopathy screening ?- Plan: Hemoglobin A1c, metFORMIN (GLUCOPHAGE) 500 MG tablet ? ?Lipoma of back  ?-discussed removal options ?-given size, refer for removal ?-given handout ?- Plan: Ambulatory referral to Dermatology ? ?Hot flashes due to menopause  ?-discussed OTC and rx treatment options ?- Plan: TSH, T4, Free ? ?F/u in 1 month ? ?Grier Mitts, MD ?

## 2021-06-29 NOTE — Patient Instructions (Addendum)
You can use topical pain reliever creams such as Biofreeze, IcyHot, Aspercreme, etc. you can also take Tylenol if needed.  Heat and stretching will also help with your back. ? ?Do not forget to schedule your mammogram and follow-up colonoscopy. ?You should schedule a physical in the next few months.   ?

## 2021-07-23 ENCOUNTER — Ambulatory Visit: Payer: Medicare PPO | Admitting: Family Medicine

## 2021-07-23 ENCOUNTER — Encounter: Payer: Self-pay | Admitting: Family Medicine

## 2021-07-23 VITALS — BP 118/74 | HR 97 | Temp 98.5°F | Wt 255.4 lb

## 2021-07-23 DIAGNOSIS — E119 Type 2 diabetes mellitus without complications: Secondary | ICD-10-CM | POA: Diagnosis not present

## 2021-07-23 DIAGNOSIS — Z6841 Body Mass Index (BMI) 40.0 and over, adult: Secondary | ICD-10-CM

## 2021-07-23 DIAGNOSIS — Z8679 Personal history of other diseases of the circulatory system: Secondary | ICD-10-CM | POA: Diagnosis not present

## 2021-07-23 DIAGNOSIS — E66813 Obesity, class 3: Secondary | ICD-10-CM

## 2021-07-23 NOTE — Progress Notes (Signed)
Subjective:  ? ? Patient ID: Caroline Velez, female    DOB: 05/23/1955, 66 y.o.   MRN: [id number] ? ?Chief Complaint  ?Patient presents with  ? Follow-up  ?  BP has not been checking it at home.   ? ? ?HPI ?Patient was seen today for follow-up and BP check.  Patient states she is drinking three 48 ounce bottles of water per day, eating more vegetables, eating at home more, cut out fast food and tea.  Lost 4 pounds.  Pt has yet to restart walking due to weather.  Has not been checking blood sugar.  Last hemoglobin A1c 6.8% on 06/29/2021. ? ?Has appointment with dermatology December 14 for lipoma removal. ? ?Past Medical History:  ?Diagnosis Date  ? Allergy   ? Diabetes mellitus without complication ([place])   ? Hyperlipidemia   ? 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 ? ?No Known Allergies ? ?ROS ?General: Denies fever, chills, night sweats, changes in weight, changes in appetite ?HEENT: Denies headaches, ear pain, changes in vision, rhinorrhea, sore throat ?CV: Denies CP, palpitations, SOB, orthopnea ?Pulm: Denies SOB, cough, wheezing ?GI: Denies abdominal pain, nausea, vomiting, diarrhea, constipation ?GU: Denies dysuria, hematuria, frequency, vaginal discharge ?Msk: Denies muscle cramps, joint pains ?Neuro: Denies weakness, numbness, tingling ?Skin: Denies rashes, bruising ?Psych: Denies depression, anxiety, hallucinations ? ?   ?Objective:  ?  ?Blood pressure 118/74, pulse 97, temperature 98.5 ?F (36.9 ?C), temperature source Oral, weight 255 lb 6.4 oz (115.8 kg), SpO2 97 %. Body mass index is 46.71 kg/m?. ? ?Gen. Pleasant, well-nourished, in no distress, normal affect   ?HEENT: [place]/AT, face symmetric, conjunctiva clear, no scleral icterus, PERRLA, EOMI, nares patent without drainage ?Lungs: no accessory muscle use, CTAB, no wheezes or rales ?Cardiovascular: RRR, no m/r/g, no peripheral edema ?Musculoskeletal: No deformities, no cyanosis or clubbing, normal tone ?Neuro:  A&Ox3, CN II-XII intact, normal  gait ?Skin:  Warm, no lesions/ rash ? ? ?Wt Readings from Last 3 Encounters:  ?07/23/21 255 lb 6.4 oz (115.8 kg)  ?06/29/21 259 lb 9.6 oz (117.8 kg)  ?10/23/20 252 lb (114.3 kg)  ? ? ?Lab Results  ?Component Value Date  ? WBC 7.3 09/25/2020  ? HGB 11.9 (L) 09/25/2020  ? HCT 38.0 09/25/2020  ? PLT 344 09/25/2020  ? GLUCOSE 109 (H) 06/29/2021  ? CHOL 263 (H) 06/29/2021  ? TRIG 143.0 06/29/2021  ? HDL 41.30 06/29/2021  ? LDLCALC 193 (H) 06/29/2021  ? ALT 18 10/11/2019  ? AST 17 10/11/2019  ? NA 141 06/29/2021  ? K 4.4 06/29/2021  ? CL 107 06/29/2021  ? CREATININE 0.99 06/29/2021  ? BUN 12 06/29/2021  ? CO2 28 06/29/2021  ? TSH 0.75 06/29/2021  ? INR 1.07 05/02/2009  ? HGBA1C 6.8 (H) 06/29/2021  ? MICROALBUR 0.7 02/17/2019  ? ? ?Assessment/Plan: ? ?Controlled type 2 diabetes mellitus without complication, without long-term current use of insulin ([place]) ?-Hemoglobin A1c 6.8% on 06/29/2021 ?-Continue metformin 500 mg twice daily ?-Continue lifestyle occasions ?-Patient encouraged to check FSBS at home and keep a log to bring with her to clinic ?-Obtain microalbumin and foot exam at next [place]. ?-Patient to schedule diabetic retinopathy screening ?-Pneumonia vaccine due ? ?History of high blood pressure ?-controlled ?-Not currently on medications, diet controlled ?-continue lifestyle modifications ?-Patient encouraged to obtain BP cuff to monitor BP at home.  For consistent elevations greater than 140/90 current medication ? ?Class 3 severe obesity due to excess calories with serious comorbidity and body mass  index (BMI) of 45.0 to 49.9 in adult [facility]) ?-Body mass index is 46.71 kg/m?. ?-Congratulated on 4 pound weight loss ?-Patient encouraged to continue lifestyle modifications and restart walking for exercise ?-Continue to monitor.  Consider weight management ? ?F/u in 4-6 months, sooner if needed ? ?Grier Mitts, MD ?

## 2021-09-21 ENCOUNTER — Ambulatory Visit: Payer: Medicare PPO | Admitting: Family Medicine

## 2021-09-21 VITALS — BP 142/76 | HR 73 | Temp 98.8°F | Wt 256.8 lb

## 2021-09-21 DIAGNOSIS — E119 Type 2 diabetes mellitus without complications: Secondary | ICD-10-CM | POA: Diagnosis not present

## 2021-09-21 DIAGNOSIS — I10 Essential (primary) hypertension: Secondary | ICD-10-CM | POA: Diagnosis not present

## 2021-09-21 DIAGNOSIS — I83893 Varicose veins of bilateral lower extremities with other complications: Secondary | ICD-10-CM | POA: Diagnosis not present

## 2021-09-21 NOTE — Patient Instructions (Addendum)
Hemoglobin A1C was 6.2% at last visit on 07/23/21

## 2021-09-21 NOTE — Progress Notes (Unsigned)
Subjective:    Patient ID: Caroline Velez, female    DOB: 01-26-1956, 66 y.o.   MRN: [id number]  Chief Complaint  Patient presents with   Leg Pain    Pt c/o of aching on both lower legs, more left leg. Going on for 3-4 days. Describe the pain as " like pulling".     HPI Patient was seen today for acute concern.  Patient endorses varicose veins aching and edema in bilateral legs times several days.  Patient unable to wear ankle socks as they did get into her ankle.  Patient does not cook with salt.  Taking metformin intermittently.  Hemoglobin A1c was 6.2% at last visit on 07/23/2021.  Patient states she is drinking more water and exercising walking.  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     No Known Allergies  ROS General: Denies fever, chills, night sweats, changes in weight, changes in appetite HEENT: Denies headaches, ear pain, changes in vision, rhinorrhea, sore throat CV: Denies CP, palpitations, SOB, orthopnea  + bilateral LE edema, varicose veins in bilateral LEs Pulm: Denies SOB, cough, wheezing GI: Denies abdominal pain, nausea, vomiting, diarrhea, constipation GU: Denies dysuria, hematuria, frequency, vaginal discharge Msk: Denies muscle cramps, joint pains  + achy pain in bilateral LEs Neuro: Denies weakness, numbness, tingling Skin: Denies rashes, bruising + varicose veins in bilateral LEs Psych: Denies depression, anxiety, hallucinations     Objective:    Blood pressure (!) 142/76, pulse 73, temperature 98.8 F (37.1 C), temperature source Oral, weight 256 lb 12.8 oz (116.5 kg), SpO2 96 %.  Gen. Pleasant, well-nourished, in no distress, normal affect   HEENT: [place]/AT, face symmetric, conjunctiva clear, no scleral icterus, PERRLA, EOMI, nares patent without drainage Lungs: no accessory muscle use, CTAB, no wheezes or  rales Cardiovascular: RRR, no m/r/g, trace edema Musculoskeletal: No deformities, no cyanosis or clubbing, normal tone Neuro:  A&Ox3, CN II-XII intact, normal gait Skin:  Warm, no lesions/ rash.  Varicose veins chronic left popliteal area/lateral lower leg.   Wt Readings from Last 3 Encounters:  09/21/21 256 lb 12.8 oz (116.5 kg)  07/23/21 255 lb 6.4 oz (115.8 kg)  06/29/21 259 lb 9.6 oz (117.8 kg)    Lab Results  Component Value Date   WBC 7.3 09/25/2020   HGB 11.9 (L) 09/25/2020   HCT 38.0 09/25/2020   PLT 344 09/25/2020   GLUCOSE 109 (H) 06/29/2021   CHOL 263 (H) 06/29/2021   TRIG 143.0 06/29/2021   HDL 41.30 06/29/2021   LDLCALC 193 (H) 06/29/2021   ALT 18 10/11/2019   AST 17 10/11/2019   NA 141 06/29/2021   K 4.4 06/29/2021   CL 107 06/29/2021   CREATININE 0.99 06/29/2021   BUN 12 06/29/2021   CO2 28 06/29/2021   TSH 0.75 06/29/2021   INR 1.07 05/02/2009   HGBA1C 6.8 (H) 06/29/2021   MICROALBUR 0.7 02/17/2019    Assessment/Plan:  Varicose veins of both legs with edema -New problem -Symptomatic -Discussed supportive care to reduce bilateral LE edema including reducing sodium intake, elevating LEs when sitting, wearing compression socks or TED hose -Consider starting aspirin 81 mg daily--listed on med list but patient states she has not been taking. -Discussed various therapies to treat varicose veins.  Referral placed to vascular surgery -Given handout -- Plan: Ambulatory referral to Vascular Surgery  Controlled type 2 diabetes  mellitus without complication, without long-term current use of insulin ([facility]) -Hemoglobin A1c 6.8% on 06/29/2021 -Discussed the importance of continuing lifestyle modifications -Patient encouraged to take medication consistently and avoid skipping doses -Continue metformin 500 mg twice daily  Essential hypertension -Previously controlled without medication -Continue lifestyle modifications -Patient encouraged to monitor BP at home.   For continued elevation start medication  F/u in 1 month  Grier Mitts, MD

## 2021-09-22 ENCOUNTER — Encounter: Payer: Self-pay | Admitting: Family Medicine

## 2021-10-02 DIAGNOSIS — Z1231 Encounter for screening mammogram for malignant neoplasm of breast: Secondary | ICD-10-CM

## 2021-10-02 NOTE — Telephone Encounter (Signed)
Last documented mammogram was in 2017. Family history of mother with breast cancer.  Pt denies breast surgeries, breast concerns, or history of breast cancer; does not have any transfer needs. Verified insurance is current on file.  Pt scheduled for Aug 11 mobile mammogram bus at [facility] at [place].

## 2021-10-19 ENCOUNTER — Ambulatory Visit
Admission: RE | Admit: 2021-10-19 | Discharge: 2021-10-19 | Disposition: A | Discharge status: Home / Self Care | Payer: Medicare PPO | Source: Ambulatory Visit | Attending: Family Medicine | Admitting: Family Medicine

## 2021-10-19 DIAGNOSIS — Z1231 Encounter for screening mammogram for malignant neoplasm of breast: Secondary | ICD-10-CM

## 2021-10-22 DIAGNOSIS — D171 Benign lipomatous neoplasm of skin and subcutaneous tissue of trunk: Secondary | ICD-10-CM | POA: Diagnosis not present

## 2021-11-09 ENCOUNTER — Other Ambulatory Visit: Payer: Self-pay

## 2021-11-09 ENCOUNTER — Ambulatory Visit (HOSPITAL_COMMUNITY)
Admission: RE | Admit: 2021-11-09 | Discharge: 2021-11-09 | Disposition: A | Discharge status: Home / Self Care | Payer: Medicare PPO | Source: Ambulatory Visit | Attending: Vascular Surgery | Admitting: Vascular Surgery

## 2021-11-09 DIAGNOSIS — I8390 Asymptomatic varicose veins of unspecified lower extremity: Secondary | ICD-10-CM | POA: Diagnosis not present

## 2021-11-14 ENCOUNTER — Encounter: Payer: Self-pay | Admitting: Vascular Surgery

## 2021-11-14 ENCOUNTER — Ambulatory Visit: Payer: Medicare PPO | Admitting: Vascular Surgery

## 2021-11-14 VITALS — BP 141/84 | HR 85 | Temp 97.6°F | Resp 14 | Ht 62.0 in | Wt 253.0 lb

## 2021-11-14 DIAGNOSIS — I83813 Varicose veins of bilateral lower extremities with pain: Secondary | ICD-10-CM

## 2021-11-14 DIAGNOSIS — I872 Venous insufficiency (chronic) (peripheral): Secondary | ICD-10-CM

## 2021-11-14 NOTE — Progress Notes (Signed)
ASSESSMENT & PLAN   CHRONIC VENOUS INSUFFICIENCY: This patient has some superficial and deep venous reflux but only small varicose veins in her lateral left leg.  We have discussed conservative treatment.  Specifically we discussed the importance of leg elevation and the proper positioning for this.  I do not think she has been elevating her legs correctly.  I have encouraged her to continue to use her knee-high compression stockings with a gradient of 15 to 20 mmHg.  We discussed importance of exercise specifically walking and water aerobics.  We also discussed the importance of maintaining a healthy weight as central obesity especially increases lower extremity venous pressure.  I encouraged her to avoid prolonged sitting and standing.  If her varicose veins continue to enlarge significantly and her symptoms progress I be happy to reevaluate her in the future for possible laser ablation.  Currently however I do not think she would benefit significantly from laser ablation of her left great saphenous vein as it is a fairly short segment that has reflux.  REASON FOR CONSULT:    Varicose veins.  The consult is requested by Dr. Volanda Napoleon.  HPI:   Caroline Velez is a 66 y.o. female who is referred with varicose veins of both lower extremities.  I have reviewed the records from the referring office.  The patient was seen by Dr. Volanda Napoleon on 09/21/2021.  She was complaining of leg pain and was noted to have significant varicose veins.  On my history the patient has had varicose veins for years.  They have been more significant on the left side.  She describes some aching pain and heaviness in her legs but says that this is worse with elevation.  However I do not think she is been elevating her legs correctly.  She denies any previous history of DVT.  She occasionally wears knee-high compression stockings which helps some.  She denies any previous history of DVT.  She has had no previous venous  procedures.  I do not get any history of claudication, rest pain, or nonhealing ulcers.  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 Swallow whole.     aspirin-acetaminophen-caffeine (EXCEDRIN MIGRAINE) 250-250-65 MG per tablet Take 1 tablet by mouth every 6 (six) hours as needed for pain.     blood glucose meter kit and supplies Dispense based on patient and insurance preference. Use up to four times daily as directed. (FOR ICD-10 E10.9, E11.9). 1 each 0   fluticasone (FLONASE) 50 MCG/ACT nasal spray Place 2 sprays into both nostrils daily. 16 g 0   metFORMIN (GLUCOPHAGE) 500 MG tablet Take 1 tablet (500 mg total) by mouth 2 (two) times daily with a meal. 180 tablet 2   No current facility-administered medications for this visit.    REVIEW  OF SYSTEMS:  '[X]'  denotes positive finding, '[ ]'  denotes negative finding Cardiac  Comments:  Chest pain or chest pressure: x   Shortness of breath upon exertion:    Short of breath when lying flat: x   Irregular heart rhythm:        Vascular    Pain in calf, thigh, or hip brought on by ambulation: x   Pain in feet at night that wakes you up from your sleep:     Blood clot in your veins:    Leg swelling:         Pulmonary    Oxygen at home:    Productive cough:     Wheezing:         Neurologic    Sudden weakness in arms or legs:     Sudden numbness in arms or legs:     Sudden  onset of difficulty speaking or slurred speech:    Temporary loss of vision in one eye:     Problems with dizziness:         Gastrointestinal    Blood in stool:     Vomited blood:         Genitourinary    Burning when urinating:     Blood in urine:        Psychiatric    Major depression:         Hematologic    Bleeding problems:    Problems with blood clotting too easily:        Skin    Rashes or ulcers:        Constitutional    Fever or chills:    -  PHYSICAL EXAM:   Vitals:   11/14/21 0937  BP: (!) 141/84  Pulse: 85  Resp: 14  Temp: 97.6 F (36.4 C)  TempSrc: Temporal  SpO2: 98%  Weight: 253 lb (114.8 kg)  Height: '5\' 2"'  (1.575 m)   Body mass index is 46.27 kg/m. GENERAL: The patient is a well-nourished female, in no acute distress. The vital signs are documented above. CARDIAC: There is a regular rate and rhythm.  VASCULAR: I do not detect carotid bruits. She has palpable dorsalis pedis pulses bilaterally. She has some small varicose veins along the lateral aspect of her left thigh.  I did look at her left great saphenous vein myself with the SonoSite.  She has significant reflux in the proximal thigh only and then the vein exits the fascia. PULMONARY: There is good air exchange bilaterally without wheezing or rales. ABDOMEN: Soft and non-tender with normal pitched bowel sounds.  MUSCULOSKELETAL: There are no major deformities. NEUROLOGIC: No focal weakness or paresthesias are detected. SKIN: There are no ulcers or rashes noted. PSYCHIATRIC: The patient has a normal affect.  DATA:    VENOUS DUPLEX: I have reviewed the venous duplex scan that was done on 11/09/2021.  This was of the left lower extremity only.  There was no evidence of DVT.  There was deep venous reflux in the common femoral vein.  There was superficial venous reflux in the proximal left great saphenous vein in the proximal thigh which was significantly dilated up to 7 mm.  It then exited  the fascia.  There was reflux in this anterior accessory saphenous vein but this was not dilated significantly.  There was a short segment of reflux in the small saphenous vein but this too was not dilated significantly.     Deitra [facility]

## 2021-11-29 ENCOUNTER — Encounter: Payer: Self-pay | Admitting: Plastic Surgery

## 2021-11-29 ENCOUNTER — Ambulatory Visit (INDEPENDENT_AMBULATORY_CARE_PROVIDER_SITE_OTHER): Payer: Medicare PPO | Admitting: Plastic Surgery

## 2021-11-29 DIAGNOSIS — D171 Benign lipomatous neoplasm of skin and subcutaneous tissue of trunk: Secondary | ICD-10-CM

## 2021-11-29 NOTE — Progress Notes (Signed)
   Referring Provider Billie Ruddy, MD [place],  [place] [id number]   CC:  Lipoma of right back.   Caroline Velez is an 66 y.o. female.  HPI: 66 year old with a lipoma of the right back.  This is been present for 2 to 3 years but has increased in size over time.  She was referred by dermatology.  She is diabetic but her last hemoglobin A1c was 6.8.  Her sugars been relatively stable.    No Known Allergies  Outpatient Encounter Medications as of 11/29/2021  Medication Sig   aspirin EC 81 MG tablet Take 81 mg by mouth daily. Swallow whole.   aspirin-acetaminophen-caffeine (EXCEDRIN MIGRAINE) 250-250-65 MG per tablet Take 1 tablet by mouth every 6 (six) hours as needed for pain.   blood glucose meter kit and supplies Dispense based on patient and insurance preference. Use up to four times daily as directed. (FOR ICD-10 E10.9, E11.9).   fluticasone (FLONASE) 50 MCG/ACT nasal spray Place 2 sprays into both nostrils daily.   metFORMIN (GLUCOPHAGE) 500 MG tablet Take 1 tablet (500 mg total) by mouth 2 (two) times daily with a meal.   No facility-administered encounter medications on file as of 11/29/2021.     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     Past Surgical History:  Procedure Laterality Date   BREAST EXCISIONAL BIOPSY Bilateral    BREAST SURGERY     bilateral   PARTIAL HYSTERECTOMY      Family History  Problem Relation Age of Onset   Breast cancer Mother    Cancer Mother        breast   Hypertension Father    Breast cancer Paternal Aunt     Social History   Social History Narrative   Not on file     Review of Systems General: Denies fevers, chills, weight loss CV: Denies chest pain, shortness of breath, palpitations   Physical Exam    11/14/2021    9:37 AM 09/21/2021    3:33 PM 07/23/2021    8:07 AM   Vitals with BMI  Height _0     Weight 253 lbs 256 lbs 13 oz 255 lbs 6 oz  BMI 08.02    Systolic 233 612 244  Diastolic 84 76 74  Pulse 85 73 97    General:  No acute distress,  Alert and oriented, Non-Toxic, Normal speech and affect Patient has a 12 x 12 cm mass on the right back.  Assessment/Plan Likely lipoma of the right back.  We counseled the patient that this will leave a scar approximately the size of lesion that may be very noticeable but she would like this removed.  Most likely this is benign in nature but we will send this to pathology of course.    Lennice Sites 11/29/2021, 10:56 AM

## 2021-12-11 ENCOUNTER — Telehealth: Payer: Self-pay | Admitting: Student

## 2021-12-11 NOTE — Telephone Encounter (Signed)
Left voicemail message to return call.  Patient needs to schedule a N/C consult with one of the surgeons

## 2021-12-18 ENCOUNTER — Telehealth: Payer: Self-pay | Admitting: Plastic Surgery

## 2021-12-18 NOTE — Telephone Encounter (Signed)
Pt called to get an update on insurance authorization for surgery.

## 2021-12-19 ENCOUNTER — Telehealth: Payer: Self-pay | Admitting: Plastic Surgery

## 2021-12-19 NOTE — Telephone Encounter (Signed)
LVM to call office to get schedule with new provider to add new provider to insurance authorization request.

## 2021-12-27 ENCOUNTER — Telehealth: Payer: Self-pay | Admitting: Plastic Surgery

## 2021-12-27 NOTE — Telephone Encounter (Signed)
Left message to r/s with new provider due to [place] moving

## 2021-12-28 DIAGNOSIS — H6523 Chronic serous otitis media, bilateral: Secondary | ICD-10-CM | POA: Diagnosis not present

## 2021-12-28 DIAGNOSIS — H6993 Unspecified Eustachian tube disorder, bilateral: Secondary | ICD-10-CM | POA: Diagnosis not present

## 2021-12-28 DIAGNOSIS — H7392 Unspecified disorder of tympanic membrane, left ear: Secondary | ICD-10-CM | POA: Diagnosis not present

## 2021-12-28 DIAGNOSIS — Z974 Presence of external hearing-aid: Secondary | ICD-10-CM | POA: Diagnosis not present

## 2021-12-28 DIAGNOSIS — H906 Mixed conductive and sensorineural hearing loss, bilateral: Secondary | ICD-10-CM | POA: Diagnosis not present

## 2022-01-14 DIAGNOSIS — H7392 Unspecified disorder of tympanic membrane, left ear: Secondary | ICD-10-CM | POA: Diagnosis not present

## 2022-01-14 DIAGNOSIS — H6993 Unspecified Eustachian tube disorder, bilateral: Secondary | ICD-10-CM | POA: Diagnosis not present

## 2022-01-23 ENCOUNTER — Ambulatory Visit: Payer: Medicare PPO | Admitting: Family Medicine

## 2022-02-21 ENCOUNTER — Ambulatory Visit: Payer: Medicare PPO | Admitting: Family Medicine

## 2022-02-21 ENCOUNTER — Ambulatory Visit: Payer: Medicare PPO | Admitting: Physician Assistant

## 2022-02-22 ENCOUNTER — Encounter: Payer: Self-pay | Admitting: Family Medicine

## 2022-02-22 ENCOUNTER — Ambulatory Visit: Payer: Medicare PPO | Admitting: Family Medicine

## 2022-02-22 VITALS — BP 132/70 | HR 75 | Temp 98.7°F | Wt 249.8 lb

## 2022-02-22 DIAGNOSIS — F439 Reaction to severe stress, unspecified: Secondary | ICD-10-CM

## 2022-02-22 DIAGNOSIS — R208 Other disturbances of skin sensation: Secondary | ICD-10-CM

## 2022-02-22 DIAGNOSIS — Z780 Asymptomatic menopausal state: Secondary | ICD-10-CM

## 2022-02-22 DIAGNOSIS — M542 Cervicalgia: Secondary | ICD-10-CM

## 2022-02-22 DIAGNOSIS — G4489 Other headache syndrome: Secondary | ICD-10-CM | POA: Diagnosis not present

## 2022-02-22 DIAGNOSIS — E119 Type 2 diabetes mellitus without complications: Secondary | ICD-10-CM

## 2022-02-22 DIAGNOSIS — M25519 Pain in unspecified shoulder: Secondary | ICD-10-CM

## 2022-02-22 LAB — MICROALBUMIN / CREATININE URINE RATIO
Creatinine,U: 220.8 mg/dL
Microalb Creat Ratio: 0.4 mg/g (ref 0.0–30.0)
Microalb, Ur: 0.9 mg/dL (ref 0.0–1.9)

## 2022-02-22 LAB — FOLATE: Folate: 13.8 ng/mL (ref >5.9)

## 2022-02-22 LAB — CBC WITH DIFFERENTIAL/PLATELET
Basophils Absolute: 0.1 10*3/uL (ref 0.0–0.1)
Basophils Relative: 0.8 % (ref 0.0–3.0)
Eosinophils Absolute: 0.2 10*3/uL (ref 0.0–0.7)
Eosinophils Relative: 2.4 % (ref 0.0–5.0)
HCT: 37.4 % (ref 36.0–46.0)
Hemoglobin: 12.3 g/dL (ref 12.0–15.0)
Lymphocytes Relative: 36.1 % (ref 12.0–46.0)
Lymphs Abs: 2.5 10*3/uL (ref 0.7–4.0)
MCHC: 33 g/dL (ref 30.0–36.0)
MCV: 88.6 fl (ref 78.0–100.0)
Monocytes Absolute: 0.7 10*3/uL (ref 0.1–1.0)
Monocytes Relative: 10.7 % (ref 3.0–12.0)
Neutro Abs: 3.4 10*3/uL (ref 1.4–7.7)
Neutrophils Relative %: 50 % (ref 43.0–77.0)
Platelets: 330 10*3/uL (ref 150.0–400.0)
RBC: 4.22 Mil/uL (ref 3.87–5.11)
RDW: 14.8 % (ref 11.5–15.5)
WBC: 6.8 10*3/uL (ref 4.0–10.5)

## 2022-02-22 LAB — COMPREHENSIVE METABOLIC PANEL
ALT: 21 U/L (ref 0–35)
AST: 22 U/L (ref 0–37)
Albumin: 4.4 g/dL (ref 3.5–5.2)
Alkaline Phosphatase: 107 U/L (ref 39–117)
BUN: 15 mg/dL (ref 6–23)
CO2: 28 mEq/L (ref 19–32)
Calcium: 9.8 mg/dL (ref 8.4–10.5)
Chloride: 104 mEq/L (ref 96–112)
Creatinine, Ser: 1.11 mg/dL (ref 0.40–1.20)
GFR: 52.05 mL/min — ABNORMAL LOW (ref >60.00)
Glucose, Bld: 89 mg/dL (ref 70–99)
Potassium: 4.6 mEq/L (ref 3.5–5.1)
Sodium: 139 mEq/L (ref 135–145)
Total Bilirubin: 0.3 mg/dL (ref 0.2–1.2)
Total Protein: 7.7 g/dL (ref 6.0–8.3)

## 2022-02-22 LAB — TSH: TSH: 0.57 u[IU]/mL (ref 0.35–5.50)

## 2022-02-22 LAB — VITAMIN D 25 HYDROXY (VIT D DEFICIENCY, FRACTURES): VITD: 16.15 ng/mL — ABNORMAL LOW (ref 30.00–100.00)

## 2022-02-22 LAB — VITAMIN B12: Vitamin B-12: 107 pg/mL — ABNORMAL LOW (ref 211–911)

## 2022-02-22 LAB — HEMOGLOBIN A1C: Hgb A1c MFr Bld: 6.4 % (ref 4.6–6.5)

## 2022-02-22 NOTE — Progress Notes (Signed)
Subjective:    Patient ID: Caroline Velez, female    DOB: 1955/11/08, 66 y.o.   MRN: [id number]  Chief Complaint  Patient presents with   Headache    Pt c/o of headache locates on the top of her head, radiates down to neck and sometimes to left arm. Noticed symptoms 2 wks ago. Pt states she think it is from her stress. She added she had went to gym, that have helped some.     HPI Patient was seen today for acute concern.  Pt with burning sensation in middle of head and ache in neck and b/l shoulders down UEs x 2 wks.  Has happened when in the grocery store and while in bed.  Typically last 2-3 min.  Pt will lay down when symptoms occur.  Notes stress may be contributing. States sensation is not a hot flash.  Pt denies HA, UE weakness, numbness/tingling, changes in vision, n/v, rash. Started exercising at a community center in the last few wks.  Pt taking metformin for DM.  Using flonase.  HAs f/u appt in a few wks with ENT.  Mentions a cyst in L TM.  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 Neuro: Denies weakness, numbness, tingling  Skin: Denies rashes, bruising Psych: Denies depression, anxiety, hallucinations +stess    Objective:    Blood pressure 132/70, pulse 75, temperature 98.7 F (37.1 C), temperature source Oral, weight 249 lb 12.8 oz (113.3 kg), SpO2 97 %.Body mass index is  45.69 kg/m.  Gen. Pleasant, well-nourished, in no distress, normal affect   HEENT: [place]/AT, face symmetric, conjunctiva clear, no scleral icterus, PERRLA, EOMI, nares patent without drainage, pharynx without erythema or exudate.  R TM full.  L TM dull in color with mild erythema. Neck: No JVD, no thyromegaly Lungs: no accessory muscle use, CTAB, no wheezes or rales Cardiovascular: RRR, no m/r/g, no peripheral edema Musculoskeletal: normal strength in UEs b/l, in neck.  Negative axial loading.  No deformities, no cyanosis or clubbing, normal tone Neuro:  A&Ox3, CN II-XII intact, normal gait Skin:  Warm, no lesions/ rash  Diabetic Foot Exam - Simple   Simple Foot Form Diabetic Foot exam was performed with the following findings: Yes 02/22/2022 10:28 AM  Visual Inspection No deformities, no ulcerations, no other skin breakdown bilaterally: Yes See comments: Yes Sensation Testing Intact to touch and monofilament testing bilaterally: Yes Pulse Check Posterior Tibialis and Dorsalis pulse intact bilaterally: Yes Comments Medial R great toe and b/l heels with hypertrophic skin.     Wt Readings from Last 3 Encounters:  02/22/22 249 lb 12.8 oz (113.3 kg)  11/14/21 253 lb (114.8 kg)  09/21/21 256 lb 12.8 oz (116.5 kg)    Lab Results  Component Value Date   WBC 7.3 09/25/2020   HGB 11.9 (L) 09/25/2020   HCT 38.0 09/25/2020   PLT 344 09/25/2020   GLUCOSE 109 (H) 06/29/2021   CHOL 263 (H) 06/29/2021  TRIG 143.0 06/29/2021   HDL 41.30 06/29/2021   LDLCALC 193 (H) 06/29/2021   ALT 18 10/11/2019   AST 17 10/11/2019   NA 141 06/29/2021   K 4.4 06/29/2021   CL 107 06/29/2021   CREATININE 0.99 06/29/2021   BUN 12 06/29/2021   CO2 28 06/29/2021   TSH 0.75 06/29/2021   INR 1.07 05/02/2009   HGBA1C 6.8 (H) 06/29/2021   MICROALBUR 0.7 02/17/2019    Assessment/Plan:  Burning sensation  - Plan: Vitamin B12, Folate, CBC with Differential/Platelet, CMP, TSH, Vitamin D, 25-hydroxy,  Ambulatory referral to Neurology  Neck and shoulder pain - Plan: CBC with Differential/Platelet  Controlled type 2 diabetes mellitus without complication, without long-term current use of insulin ([place])  -Foot exam done this visit -Patient endorses diabetic retinopathy screening done earlier in 2023 - Plan: Hemoglobin A1c, Microalbumin/Creatinine Ratio, Urine  Morbid obesity ([place]) - Plan: CMP, Hemoglobin A1c, TSH, Vitamin D, 25-hydroxy  Other headache syndrome - Plan: Ambulatory referral to Neurology  Stress  Asymptomatic postmenopausal state - Plan: DG Bone Density   Will obtain labs to evaluate burning sensation in head as may be related to electrolyte or vitamin abnormality.  Referral to neurology for continued symptoms and consider imaging based on results.  Discussed stress reduction including continuing exercise.  Supportive care with OTC topical analgesics or Tylenol as needed.  Patient declines all immunizations.  F/u prn in 1 month  Grier Mitts, MD

## 2022-02-22 NOTE — Patient Instructions (Signed)
Labs were placed and the referral to the Neurologist was placed in case it is still needed.

## 2022-02-25 ENCOUNTER — Other Ambulatory Visit: Payer: Self-pay | Admitting: Family Medicine

## 2022-02-25 DIAGNOSIS — E538 Deficiency of other specified B group vitamins: Secondary | ICD-10-CM

## 2022-02-25 DIAGNOSIS — E559 Vitamin D deficiency, unspecified: Secondary | ICD-10-CM

## 2022-02-25 MED ORDER — VITAMIN D (ERGOCALCIFEROL) 1.25 MG (50000 UNIT) PO CAPS: 50000.0000 [IU] | ORAL_CAPSULE | ORAL | 0 refills | Status: DC

## 2022-02-28 ENCOUNTER — Telehealth: Payer: Self-pay | Admitting: Family Medicine

## 2022-02-28 NOTE — Telephone Encounter (Signed)
Pt called to ask when does MD want her to start coming in to get her B12 injections?  Please advise.

## 2022-03-05 DIAGNOSIS — H7392 Unspecified disorder of tympanic membrane, left ear: Secondary | ICD-10-CM | POA: Diagnosis not present

## 2022-03-05 DIAGNOSIS — H906 Mixed conductive and sensorineural hearing loss, bilateral: Secondary | ICD-10-CM | POA: Diagnosis not present

## 2022-03-05 NOTE — Telephone Encounter (Signed)
Pt will be here tomorrow afternoon for her B12 shot (03/06/22)

## 2022-03-05 NOTE — Telephone Encounter (Signed)
Okay to schedule to start now.

## 2022-03-06 ENCOUNTER — Ambulatory Visit (INDEPENDENT_AMBULATORY_CARE_PROVIDER_SITE_OTHER): Payer: Medicare PPO

## 2022-03-06 ENCOUNTER — Encounter: Payer: Self-pay | Admitting: Plastic Surgery

## 2022-03-06 ENCOUNTER — Ambulatory Visit (INDEPENDENT_AMBULATORY_CARE_PROVIDER_SITE_OTHER): Payer: Medicare PPO | Admitting: Plastic Surgery

## 2022-03-06 VITALS — BP 139/80 | HR 86 | Ht 62.0 in | Wt 249.6 lb

## 2022-03-06 DIAGNOSIS — D171 Benign lipomatous neoplasm of skin and subcutaneous tissue of trunk: Secondary | ICD-10-CM

## 2022-03-06 DIAGNOSIS — R222 Localized swelling, mass and lump, trunk: Secondary | ICD-10-CM

## 2022-03-06 DIAGNOSIS — E538 Deficiency of other specified B group vitamins: Secondary | ICD-10-CM

## 2022-03-06 MED ORDER — CYANOCOBALAMIN 1000 MCG/ML IJ SOLN
1000.0000 ug | Freq: Once | INTRAMUSCULAR | Status: AC
  Administered 2022-03-06: 1000 ug via INTRAMUSCULAR

## 2022-03-06 NOTE — Progress Notes (Addendum)
Per orders of Billie Ruddy, MD, injection of B12 given in  left deltoid by Franco Collet. Patient tolerated injection well.  Lab Results  Component Value Date   VITAMINB12 107 (L) 02/22/2022

## 2022-03-06 NOTE — Progress Notes (Signed)
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  She was initially scheduled for surgery however the procedure has not yet been performed and she returns today for evaluation and scheduling.  Overweight she is reasonably healthy and only takes metformin for diabetes.   On examination she has a large 12 to 15 x 12 cm soft tissue mass overlying the lower portion of the scapula on the right side.  I agree that this is consistent with a large lipoma.  Lipoma: I believe this is too large to excise in the clinic and will require removal in the operating room.  We discussed the procedure including the fact that she will have a scar but the thickness and size of the scar will be difficult to determine due to the unpredictable nature of scarring.  She will likely have a drain afterwards to prevent significant seroma collection.  I have asked her to stop her aspirin 5 days prior to the procedure.  Will schedule at her request.

## 2022-03-06 NOTE — Patient Instructions (Signed)
Health Maintenance Due  Topic Date Due   Medicare Annual Wellness (AWV)  Never done   OPHTHALMOLOGY EXAM  Never done   DEXA SCAN  Never done      Row Labels 02/22/2022    9:36 AM 09/21/2021    3:41 PM 07/23/2021    8:10 AM  Depression screen PHQ 2/9   Section Header. No data exists in this row.     Decreased Interest   0 0   Down, Depressed, Hopeless   0 1 0  PHQ - 2 Score   0 1 0  Altered sleeping   '3 3 3  '$ Tired, decreased energy   2 3 0  Change in appetite   0 2 0  Feeling bad or failure about yourself    0 0 0  Trouble concentrating   0 0 0  Moving slowly or fidgety/restless   0 0 0  Suicidal thoughts   0 0 0  PHQ-9 Score   '5 9 3  '$ Difficult doing work/chores   Not difficult at all  Not difficult at all

## 2022-03-25 IMAGING — CT CT CHEST W/O CM
1 of 3 series · 13 of 30 positions shown, 17 images · non-contrast
Comparison: Chest radiograph 09/25/2020.  CTA chest 10/13/2019

CLINICAL DATA: Possible left upper lobe pulmonary nodule on chest
radiograph. Recent chest pain.

EXAM:
CT CHEST WITHOUT CONTRAST
TECHNIQUE: Multidetector CT imaging of the chest was performed following the
standard protocol without IV contrast.

[Series 2: thorax · axial · 0.65mm/px · z∈[-297,-57]mm · 13 of 140 slices shown, 17 images]
[im 10/140  mediastinal]
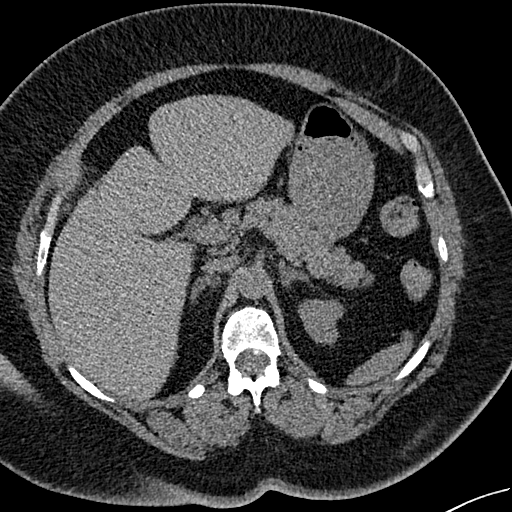
[im 10/140  lung]
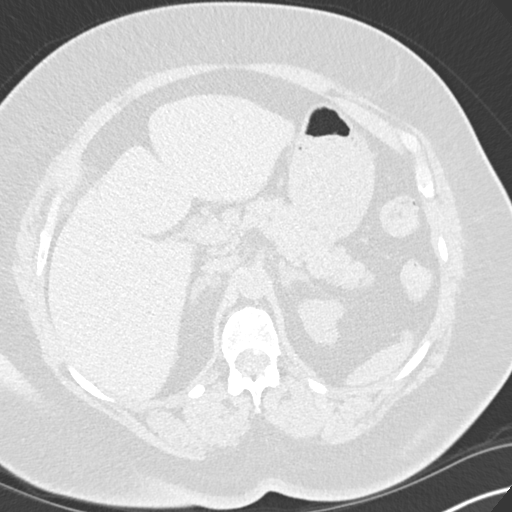
[im 20/140  lung]
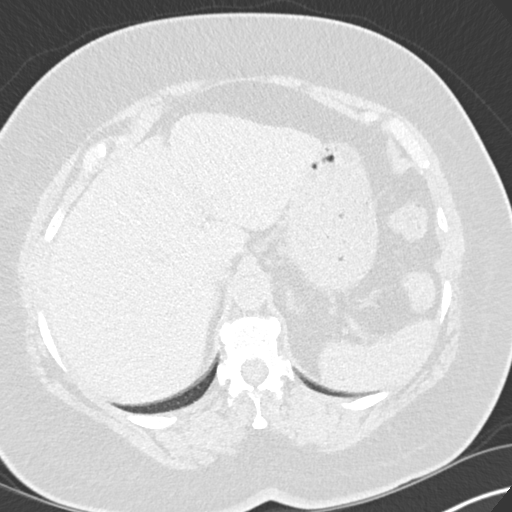
[im 30/140  lung]
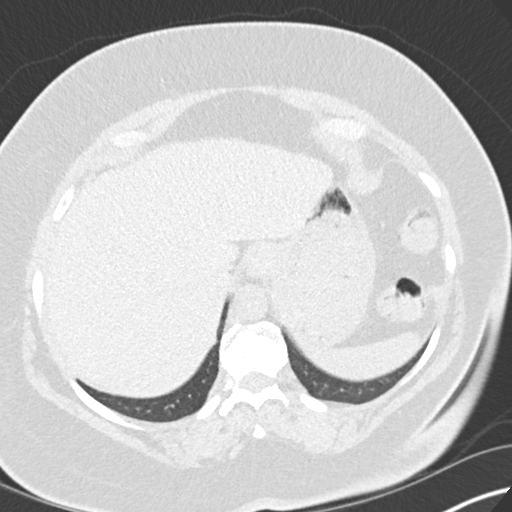
[im 40/140  lung]
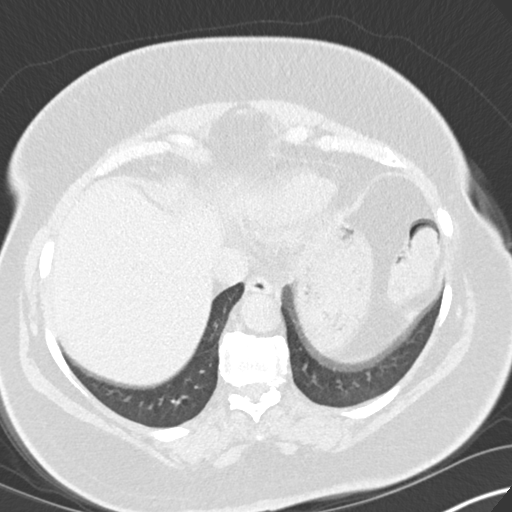
[im 50/140  mediastinal]
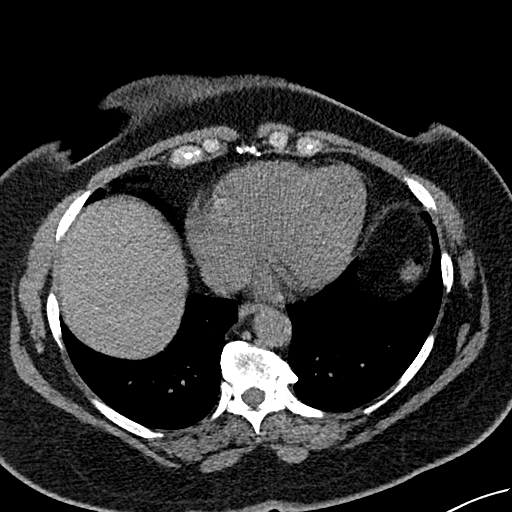
[im 50/140  lung]
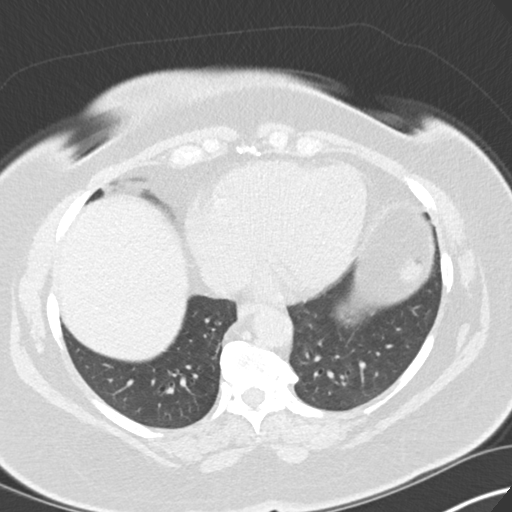
[im 60/140  lung]
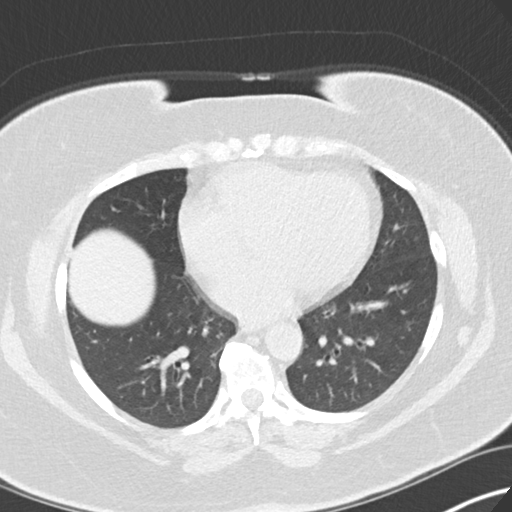
[im 70/140  lung]
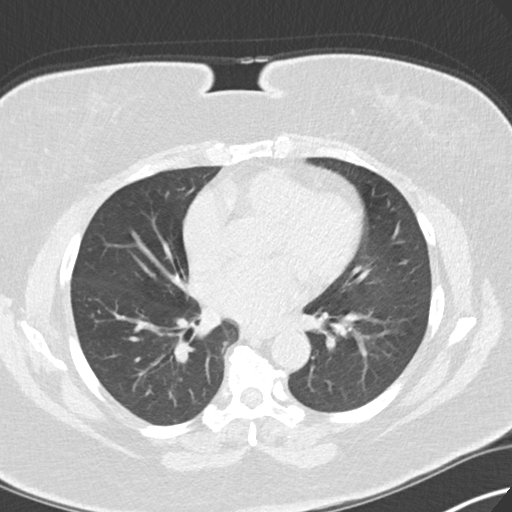
[im 80/140  lung]
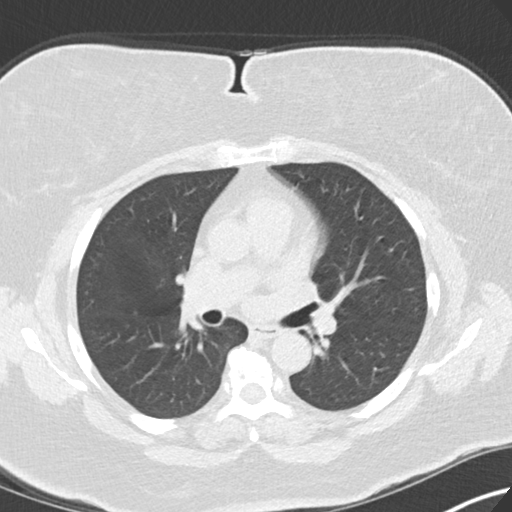
[im 90/140  mediastinal]
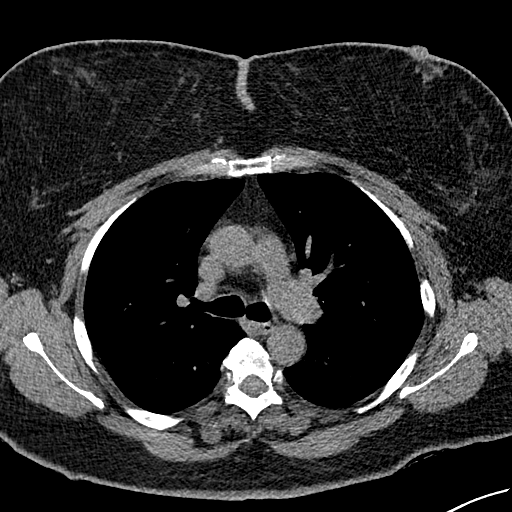
[im 90/140  lung]
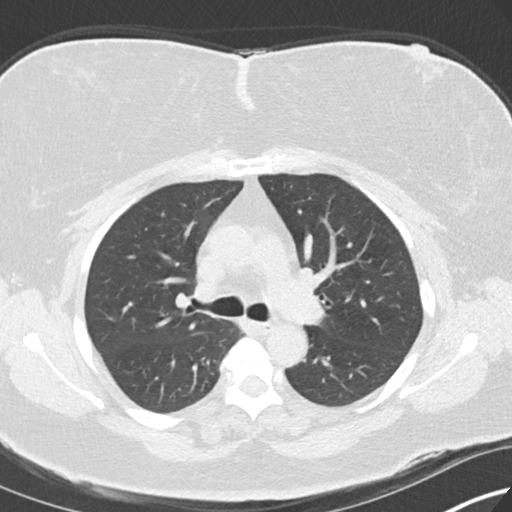
[im 100/140  lung]
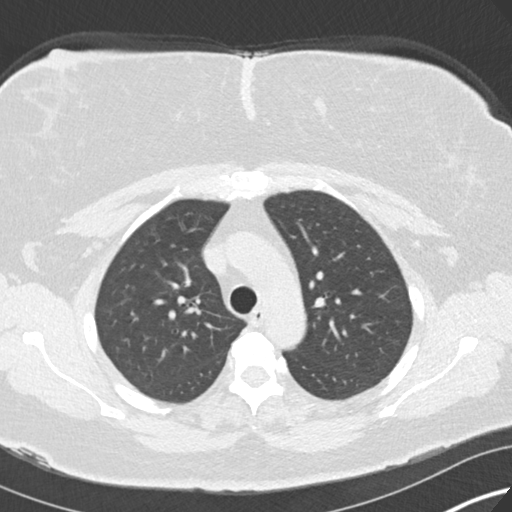
[im 110/140  lung]
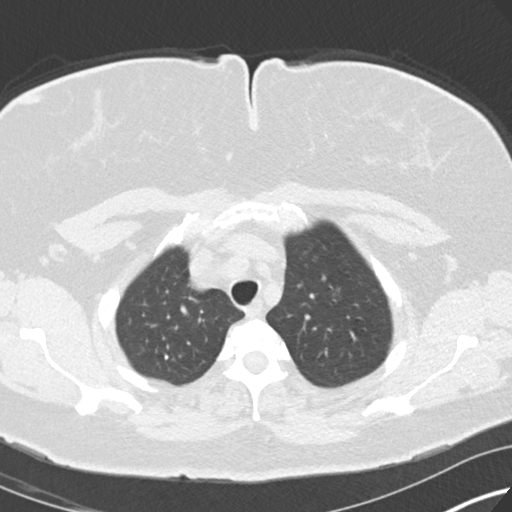
[im 120/140  lung]
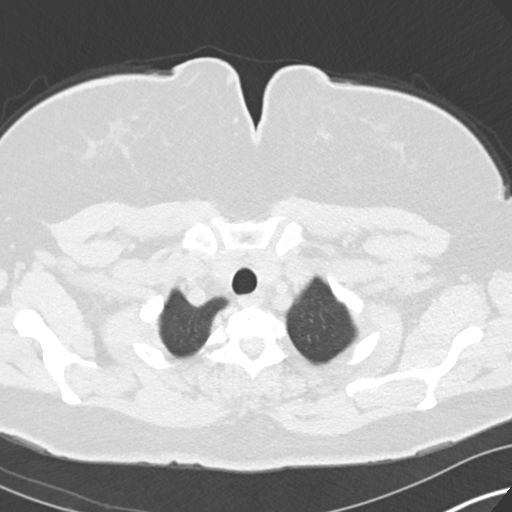
[im 130/140  mediastinal]
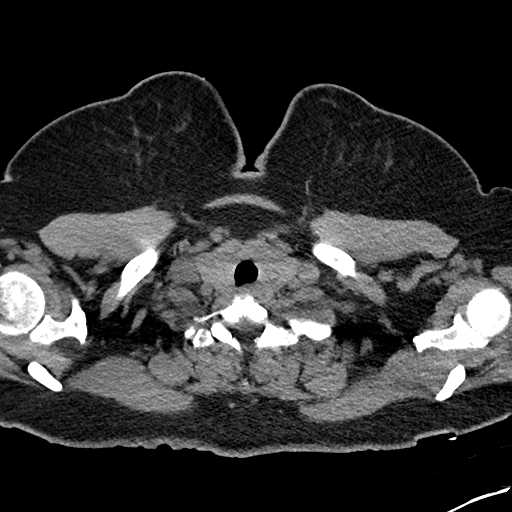
[im 130/140  lung]
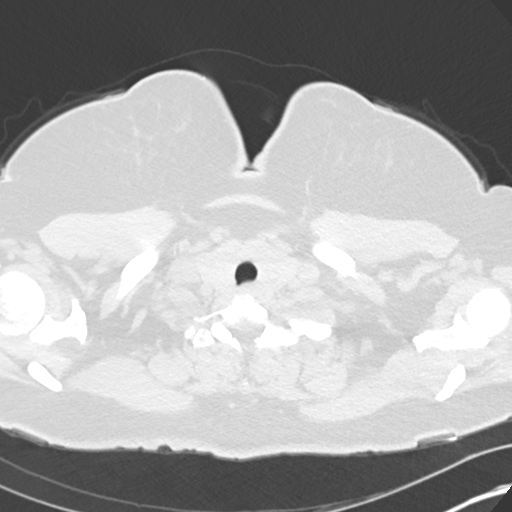

[13 of 30 positions shown; findings below may reference images not displayed]

FINDINGS: Cardiovascular: Normal aortic caliber. Tortuous thoracic aorta.
Borderline cardiomegaly.

Mediastinum/Nodes: No mediastinal or definite hilar adenopathy,
given limitations of unenhanced CT.

Lungs/Pleura: No pleural fluid. No pulmonary nodule or correlate for
the plain film abnormality in the left upper lobe. There are tiny
right upper lobe pulmonary nodules x2 which are likely calcified and
can be presumed benign given stability since 10/13/2019.

Upper Abdomen: Normal imaged portions of the liver, spleen, stomach,
pancreas, gallbladder, adrenal glands, kidneys.

Musculoskeletal: Midthoracic spondylosis.
IMPRESSION: 1. No dominant pulmonary nodule or correlate for the left-sided
plain film abnormality.
2.  No acute process in the chest.

## 2022-04-05 ENCOUNTER — Ambulatory Visit (INDEPENDENT_AMBULATORY_CARE_PROVIDER_SITE_OTHER): Payer: Medicare PPO

## 2022-04-05 DIAGNOSIS — E538 Deficiency of other specified B group vitamins: Secondary | ICD-10-CM | POA: Diagnosis not present

## 2022-04-05 MED ORDER — CYANOCOBALAMIN 1000 MCG/ML IJ SOLN
1000.0000 ug | Freq: Once | INTRAMUSCULAR | Status: AC
  Administered 2022-04-05: 1000 ug via INTRAMUSCULAR

## 2022-04-05 NOTE — Progress Notes (Signed)
Per orders of Dr. Volanda Napoleon, injection of Cyanocobalamin Inj. 1000 mcg given by Encarnacion Slates on Right Deltoid.  Patient tolerated injection well.   Pt is scheduled on 05/06/2022 for next injection.

## 2022-04-14 ENCOUNTER — Other Ambulatory Visit: Payer: Self-pay | Admitting: Family Medicine

## 2022-04-14 DIAGNOSIS — E119 Type 2 diabetes mellitus without complications: Secondary | ICD-10-CM

## 2022-04-17 ENCOUNTER — Telehealth: Payer: Self-pay | Admitting: *Deleted

## 2022-04-17 NOTE — Telephone Encounter (Signed)
Spoke with pt to schedule sx and related appts

## 2022-04-25 ENCOUNTER — Ambulatory Visit (INDEPENDENT_AMBULATORY_CARE_PROVIDER_SITE_OTHER): Payer: Medicare PPO | Admitting: Physician Assistant

## 2022-04-25 VITALS — BP 139/83 | HR 79

## 2022-04-25 DIAGNOSIS — D171 Benign lipomatous neoplasm of skin and subcutaneous tissue of trunk: Secondary | ICD-10-CM

## 2022-04-25 MED ORDER — ONDANSETRON 4 MG PO TBDP: 4.0000 mg | ORAL_TABLET | Freq: Three times a day (TID) | ORAL | 0 refills | Status: DC | PRN

## 2022-04-25 MED ORDER — OXYCODONE HCL 5 MG PO TABS: 5.0000 mg | ORAL_TABLET | Freq: Three times a day (TID) | ORAL | 0 refills | Status: AC | PRN

## 2022-04-25 NOTE — H&P (View-Only) (Signed)
Patient ID: Caroline Velez, female    DOB: Jan 28, 1956, 66 y.o.   MRN: [id number]  Chief Complaint  Patient presents with   Pre-op Exam      ICD-10-CM   1. Lipoma of back  D17.1        History of Present Illness: Caroline Velez is a 67 y.o.  female  with a history of right mid back soft tissue mass.  She presents for preoperative evaluation for upcoming procedure, excision of suspected lipoma, scheduled for 05/14/2022 with Dr.  Lovena Le .  The patient has not had problems with anesthesia.  Previous lumpectomies without issue.  She is accompanied by family at bedside.  They will be able to assist with her immediate postoperative recovery.  Her hypertension is currently well-controlled with lifestyle modifications.  She plans to hold her aspirin as requested by Dr. Lovena Le.  Discussed with patient that we will obtain clearance from her primary care provider, as well.  Plan to hold her aspirin, Excedrin, and vitamin D/multivitamins 7 days prior to surgery.  She can resume them the day after surgery.  She denies any personal or family history of blood clots or clotting disorder.  No personal history of cancer or any severe cardiac/pulmonary disease.  NKDA.  No tape allergies.  Denies history of keloiding.  Summary of Previous Visit: Patient was initially seen for consult by Dr. Erin Hearing 11/2021 at which point she complained of a soft tissue mass on right upper back that has been present 2 to 3 years and increasing in size.  She was referred to the clinic by her dermatologist.  She then was seen for consult by Dr. Lovena Le 03/06/2022.  On examination, mass was appreciated to be approximately 12 x 12 cm overlying the lower portion of the right scapula.  Felt to be consistent with a large lipoma.  Discussed excision in the OR as well as the risks.  Patient understands that there will be a scar from the surgery as well as likely drain placement to prevent large seroma.  Patient will need to  hold her aspirin prior to the procedure.  PMH Significant for: Soft tissue mass right upper back, type II DM on metformin, lower extremity varicosities, HTN, HLD, bacterial meningitis, obesity.   Past Medical History: Allergies: No Known Allergies  Current Medications:  Current Outpatient Medications:    blood glucose meter kit and supplies, Dispense based on patient and insurance preference. Use up to four times daily as directed. (FOR ICD-10 E10.9, E11.9)., Disp: 1 each, Rfl: 0   fluticasone (FLONASE) 50 MCG/ACT nasal spray, Place 2 sprays into both nostrils daily., Disp: 16 g, Rfl: 0   metFORMIN (GLUCOPHAGE) 500 MG tablet, TAKE 1 TABLET(500 MG) BY MOUTH TWICE DAILY WITH A MEAL, Disp: 180 tablet, Rfl: 2   ondansetron (ZOFRAN-ODT) 4 MG disintegrating tablet, Take 1 tablet (4 mg total) by mouth every 8 (eight) hours as needed for nausea or vomiting., Disp: 20 tablet, Rfl: 0   oxyCODONE (ROXICODONE) 5 MG immediate release tablet, Take 1 tablet (5 mg total) by mouth every 8 (eight) hours as needed for up to 5 days for severe pain., Disp: 8 tablet, Rfl: 0   Vitamin D, Ergocalciferol, (DRISDOL) 1.25 MG (50000 UNIT) CAPS capsule, Take 1 capsule (50,000 Units total) by mouth every 7 (seven) days., Disp: 12 capsule, Rfl: 0   aspirin EC 81 MG tablet, Take 81 mg by mouth daily. Swallow whole. (Patient not taking: Reported on 04/25/2022), Disp: ,  Rfl:    aspirin-acetaminophen-caffeine (EXCEDRIN MIGRAINE) [id number] MG per tablet, Take 1 tablet by mouth every 6 (six) hours as needed for pain. 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     Past Surgical History: Past Surgical History:  Procedure Laterality Date   BREAST EXCISIONAL BIOPSY Bilateral    BREAST SURGERY     bilateral   PARTIAL  HYSTERECTOMY      Social History: Social History   Socioeconomic History   Marital status: Single    Spouse name: Not on file   Number of children: Not on file   Years of education: Not on file   Highest education level: 12th grade  Occupational History   Not on file  Tobacco Use   Smoking status: Never   Smokeless tobacco: Never  Vaping Use   Vaping Use: Never used  Substance and Sexual Activity   Alcohol use: No   Drug use: No   Sexual activity: Not on file  Other Topics Concern   Not on file  Social History Narrative   Not on file   Social Determinants of Health   Financial Resource Strain: Unknown (06/28/2021)   Overall Financial Resource Strain (CARDIA)    Difficulty of Paying Living Expenses: Patient refused  Food Insecurity: No Food Insecurity (06/28/2021)   Hunger Vital Sign    Worried About Running Out of Food in the Last Year: Never true    Ran Out of Food in the Last Year: Never true  Transportation Needs: No Transportation Needs (06/28/2021)   PRAPARE - Hydrologist (Medical): No    Lack of Transportation (Non-Medical): No  Physical Activity: Unknown (06/28/2021)   Exercise Vital Sign    Days of Exercise per Week: 0 days    Minutes of Exercise per Session: Not on file  Stress: No Stress Concern Present (06/28/2021)   [place]    Feeling of Stress : Only a little  Social Connections: Moderately Isolated (06/28/2021)   Social Connection and Isolation Panel [NHANES]    Frequency of Communication with Friends and Family: More than three times a week    Frequency of Social Gatherings with Friends and Family: More than three times a week    Attends Religious Services: More than 4 times per year    Active Member of [employer] or Organizations: No    Attends Music therapist: Not on file    Marital Status: Divorced  Human resources officer Violence: Not on file     Family History: Family History  Problem Relation Age of Onset   Breast cancer Mother    Cancer Mother        breast   Hypertension Father    Breast cancer Paternal Aunt     Review of Systems: ROS Denies any recent chest pain, difficulty breathing, leg swelling, or fevers.  Physical Exam: Vital Signs BP 139/83 (BP Location: Left Arm, Patient Position: Sitting, Cuff Size: Large)   Pulse 79   SpO2 98%   Physical Exam Constitutional:      General: Not in acute distress.    Appearance: Normal appearance. Not ill-appearing.  HENT:     Head: Normocephalic and atraumatic.  Eyes:     Pupils: Pupils are  equal, round. Cardiovascular:     Rate and Rhythm: Normal rate.    Pulses: Normal pulses.  Pulmonary:     Effort: No respiratory distress or increased work of breathing.  Speaks in full sentences. Abdominal:     General: Abdomen is flat. No distension.   Musculoskeletal: Normal range of motion.  Skin:    General: Skin is warm and dry.     Findings: No erythema or rash.  Neurological:     Mental Status: Alert and oriented to person, place, and time.  Psychiatric:        Mood and Affect: Mood normal.        Behavior: Behavior normal.    Assessment/Plan: The patient is scheduled for excision of soft tissue mass right upper back with Dr.  Lovena Le .  Risks, benefits, and alternatives of procedure discussed, questions answered and consent obtained.    Smoking Status: Non-smoker.  Caprini Score: 6; Risk Factors include: Age, BMI greater than 25, varicosities, and length of planned surgery. Recommendation for mechanical prophylaxis. Encourage early ambulation.   Pictures obtained: 03/06/2022  Post-op Rx sent to pharmacy: Oxycodone (#8 pills), Zofran.   Patient was provided with the General Surgical Risk consent document and Pain Medication Agreement prior to their appointment.  They had adequate time to read through the risk consent documents and Pain Medication Agreement.  We also discussed them in person together during this preop appointment. All of their questions were answered to their satisfaction.  Recommended calling if they have any further questions.  Risk consent form and Pain Medication Agreement to be scanned into patient's chart.    Electronically signed by: Krista Blue, PA-C 04/25/2022 10:12 AM

## 2022-04-25 NOTE — Progress Notes (Addendum)
Patient ID: Caroline Velez, female    DOB: [birth year], 67 y.o.   MRN: [id number]  Chief Complaint  Patient presents with   Pre-op Exam      ICD-10-CM   1. Lipoma of back  D17.1        History of Present Illness: Caroline Velez is a 67 y.o.  female  with a history of right mid back soft tissue mass.  She presents for preoperative evaluation for upcoming procedure, excision of suspected lipoma, scheduled for 05/14/2022 with Dr.  Lovena Le .  The patient has not had problems with anesthesia.  Previous lumpectomies without issue.  She is accompanied by family at bedside.  They will be able to assist with her immediate postoperative recovery.  Her hypertension is currently well-controlled with lifestyle modifications.  She plans to hold her aspirin as requested by Dr. Lovena Le.  Discussed with patient that we will obtain clearance from her primary care provider, as well.  Plan to hold her aspirin, Excedrin, and vitamin D/multivitamins 7 days prior to surgery.  She can resume them the day after surgery.  She denies any personal or family history of blood clots or clotting disorder.  No personal history of cancer or any severe cardiac/pulmonary disease.  NKDA.  No tape allergies.  Denies history of keloiding.  Summary of Previous Visit: Patient was initially seen for consult by Dr. Erin Hearing 11/2021 at which point she complained of a soft tissue mass on right upper back that has been present 2 to 3 years and increasing in size.  She was referred to the clinic by her dermatologist.  She then was seen for consult by Dr. Lovena Le 03/06/2022.  On examination, mass was appreciated to be approximately 12 x 12 cm overlying the lower portion of the right scapula.  Felt to be consistent with a large lipoma.  Discussed excision in the OR as well as the risks.  Patient understands that there will be a scar from the surgery as well as likely drain placement to prevent large seroma.  Patient will need to  hold her aspirin prior to the procedure.  PMH Significant for: Soft tissue mass right upper back, type II DM on metformin, lower extremity varicosities, HTN, HLD, bacterial meningitis, obesity.   Past Medical History: Allergies: No Known Allergies  Current Medications:  Current Outpatient Medications:    blood glucose meter kit and supplies, Dispense based on patient and insurance preference. Use up to four times daily as directed. (FOR ICD-10 E10.9, E11.9)., Disp: 1 each, Rfl: 0   fluticasone (FLONASE) 50 MCG/ACT nasal spray, Place 2 sprays into both nostrils daily., Disp: 16 g, Rfl: 0   metFORMIN (GLUCOPHAGE) 500 MG tablet, TAKE 1 TABLET(500 MG) BY MOUTH TWICE DAILY WITH A MEAL, Disp: 180 tablet, Rfl: 2   ondansetron (ZOFRAN-ODT) 4 MG disintegrating tablet, Take 1 tablet (4 mg total) by mouth every 8 (eight) hours as needed for nausea or vomiting., Disp: 20 tablet, Rfl: 0   oxyCODONE (ROXICODONE) 5 MG immediate release tablet, Take 1 tablet (5 mg total) by mouth every 8 (eight) hours as needed for up to 5 days for severe pain., Disp: 8 tablet, Rfl: 0   Vitamin D, Ergocalciferol, (DRISDOL) 1.25 MG (50000 UNIT) CAPS capsule, Take 1 capsule (50,000 Units total) by mouth every 7 (seven) days., Disp: 12 capsule, Rfl: 0   aspirin EC 81 MG tablet, Take 81 mg by mouth daily. Swallow whole. (Patient not taking: Reported on 04/25/2022), Disp: ,  Rfl:    aspirin-acetaminophen-caffeine (EXCEDRIN MIGRAINE) [id number] MG per tablet, Take 1 tablet by mouth every 6 (six) hours as needed for pain. 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     Past Surgical History: Past Surgical History:  Procedure Laterality Date   BREAST EXCISIONAL BIOPSY Bilateral    BREAST SURGERY     bilateral   PARTIAL  HYSTERECTOMY      Social History: Social History   Socioeconomic History   Marital status: Single    Spouse name: Not on file   Number of children: Not on file   Years of education: Not on file   Highest education level: 12th grade  Occupational History   Not on file  Tobacco Use   Smoking status: Never   Smokeless tobacco: Never  Vaping Use   Vaping Use: Never used  Substance and Sexual Activity   Alcohol use: No   Drug use: No   Sexual activity: Not on file  Other Topics Concern   Not on file  Social History Narrative   Not on file   Social Determinants of Health   Financial Resource Strain: Unknown (06/28/2021)   Overall Financial Resource Strain (CARDIA)    Difficulty of Paying Living Expenses: Patient refused  Food Insecurity: No Food Insecurity (06/28/2021)   Hunger Vital Sign    Worried About Running Out of Food in the Last Year: Never true    Ran Out of Food in the Last Year: Never true  Transportation Needs: No Transportation Needs (06/28/2021)   PRAPARE - Hydrologist (Medical): No    Lack of Transportation (Non-Medical): No  Physical Activity: Unknown (06/28/2021)   Exercise Vital Sign    Days of Exercise per Week: 0 days    Minutes of Exercise per Session: Not on file  Stress: No Stress Concern Present (06/28/2021)   [place]    Feeling of Stress : Only a little  Social Connections: Moderately Isolated (06/28/2021)   Social Connection and Isolation Panel [NHANES]    Frequency of Communication with Friends and Family: More than three times a week    Frequency of Social Gatherings with Friends and Family: More than three times a week    Attends Religious Services: More than 4 times per year    Active Member of [employer] or Organizations: No    Attends Music therapist: Not on file    Marital Status: Divorced  Human resources officer Violence: Not on file     Family History: Family History  Problem Relation Age of Onset   Breast cancer Mother    Cancer Mother        breast   Hypertension Father    Breast cancer Paternal Aunt     Review of Systems: ROS Denies any recent chest pain, difficulty breathing, leg swelling, or fevers.  Physical Exam: Vital Signs BP 139/83 (BP Location: Left Arm, Patient Position: Sitting, Cuff Size: Large)   Pulse 79   SpO2 98%   Physical Exam Constitutional:      General: Not in acute distress.    Appearance: Normal appearance. Not ill-appearing.  HENT:     Head: Normocephalic and atraumatic.  Eyes:     Pupils: Pupils are  equal, round. Cardiovascular:     Rate and Rhythm: Normal rate.    Pulses: Normal pulses.  Pulmonary:     Effort: No respiratory distress or increased work of breathing.  Speaks in full sentences. Abdominal:     General: Abdomen is flat. No distension.   Musculoskeletal: Normal range of motion.  Skin:    General: Skin is warm and dry.     Findings: No erythema or rash.  Neurological:     Mental Status: Alert and oriented to person, place, and time.  Psychiatric:        Mood and Affect: Mood normal.        Behavior: Behavior normal.    Assessment/Plan: The patient is scheduled for excision of soft tissue mass right upper back with Dr.  Lovena Le .  Risks, benefits, and alternatives of procedure discussed, questions answered and consent obtained.    Smoking Status: Non-smoker.  Caprini Score: 6; Risk Factors include: Age, BMI greater than 25, varicosities, and length of planned surgery. Recommendation for mechanical prophylaxis. Encourage early ambulation.   Pictures obtained: 03/06/2022  Post-op Rx sent to pharmacy: Oxycodone (#8 pills), Zofran.   Patient was provided with the General Surgical Risk consent document and Pain Medication Agreement prior to their appointment.  They had adequate time to read through the risk consent documents and Pain Medication Agreement.  We also discussed them in person together during this preop appointment. All of their questions were answered to their satisfaction.  Recommended calling if they have any further questions.  Risk consent form and Pain Medication Agreement to be scanned into patient's chart.    Electronically signed by: Krista Blue, PA-C 04/25/2022 10:12 AM

## 2022-05-03 ENCOUNTER — Ambulatory Visit (INDEPENDENT_AMBULATORY_CARE_PROVIDER_SITE_OTHER): Payer: Medicare PPO

## 2022-05-03 ENCOUNTER — Telehealth: Payer: Self-pay | Admitting: *Deleted

## 2022-05-03 VITALS — Ht 62.0 in | Wt 239.0 lb

## 2022-05-03 DIAGNOSIS — Z0000 Encounter for general adult medical examination without abnormal findings: Secondary | ICD-10-CM

## 2022-05-03 NOTE — Progress Notes (Signed)
Subjective:   Kaitlyn Zocco is a 67 y.o. female who presents for Medicare Annual (Subsequent) preventive examination.  Review of Systems    Virtual Visit via Telephone Note  I connected with  Dorisann Ballinger Ruddy on 05/03/22 at  8:45 AM EST by telephone and verified that I am speaking with the correct person using two identifiers.  Location: Patient: Home Provider: Office Persons participating in the virtual visit: patient/Nurse Health Advisor   I discussed the limitations, risks, security and privacy concerns of performing an evaluation and management service by telephone and the availability of in person appointments. The patient expressed understanding and agreed to proceed.  Interactive audio and video telecommunications were attempted between this nurse and patient, however failed, due to patient having technical difficulties OR patient did not have access to video capability.  We continued and completed visit with audio only.  Some vital signs may be absent or patient reported.   Criselda Peaches, LPN  Cardiac Risk Factors include: advanced age (>39mn, >>46women);diabetes mellitus     Objective:    Today's Vitals   05/03/22 0857  Weight: 239 lb (108.4 kg)  Height: '5\' 2"'$  (1.575 m)   Body mass index is 43.71 kg/m.     05/03/2022    9:07 AM 09/25/2020    4:09 PM 01/06/2014   12:26 PM 01/12/2013    5:00 AM  Advanced Directives  Does Patient Have a Medical Advance Directive? No No No Patient has advance directive, copy not in chart  Type of Advance Directive    [place] Chart?    Copy requested from family  Would patient like information on creating a medical advance directive? No - Patient declined  No - patient declined information   Pre-existing out of facility DNR order (yellow form or pink MOST form)    No    Current Medications (verified) Outpatient Encounter Medications as of  05/03/2022  Medication Sig   aspirin EC 81 MG tablet Take 81 mg by mouth daily. Swallow whole. (Patient not taking: Reported on 04/25/2022)   aspirin-acetaminophen-caffeine (EXCEDRIN MIGRAINE) [id number] per tablet Take 1 tablet by mouth every 6 (six) hours as needed for pain. (Patient not taking: Reported on 04/25/2022)   blood glucose meter kit and supplies Dispense based on patient and insurance preference. Use up to four times daily as directed. (FOR ICD-10 E10.9, E11.9).   fluticasone (FLONASE) 50 MCG/ACT nasal spray Place 2 sprays into both nostrils daily.   metFORMIN (GLUCOPHAGE) 500 MG tablet TAKE 1 TABLET(500 MG) BY MOUTH TWICE DAILY WITH A MEAL   ondansetron (ZOFRAN-ODT) 4 MG disintegrating tablet Take 1 tablet (4 mg total) by mouth every 8 (eight) hours as needed for nausea or vomiting.   Vitamin D, Ergocalciferol, (DRISDOL) 1.25 MG (50000 UNIT) CAPS capsule Take 1 capsule (50,000 Units total) by mouth every 7 (seven) days.   No facility-administered encounter medications on file as of 05/03/2022.    Allergies (verified) Patient has no known allergies.   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    Past Surgical History:  Procedure Laterality Date   BREAST EXCISIONAL BIOPSY Bilateral    BREAST SURGERY     bilateral   PARTIAL HYSTERECTOMY     Family History  Problem Relation Age of Onset  Breast cancer Mother    Cancer Mother        breast   Hypertension Father    Breast cancer Paternal Aunt    Social History   Socioeconomic History   Marital status: Single    Spouse name: Not on file   Number of children: Not on file   Years of education: Not on file   Highest education level: 12th grade  Occupational History   Not on file  Tobacco Use   Smoking status: Never   Smokeless tobacco: Never  Vaping Use   Vaping Use: Never used   Substance and Sexual Activity   Alcohol use: No   Drug use: No   Sexual activity: Not on file  Other Topics Concern   Not on file  Social History Narrative   Not on file   Social Determinants of Health   Financial Resource Strain: Low Risk  (05/03/2022)   Overall Financial Resource Strain (CARDIA)    Difficulty of Paying Living Expenses: Not hard at all  Food Insecurity: No Food Insecurity (05/03/2022)   Hunger Vital Sign    Worried About Running Out of Food in the Last Year: Never true    [place] in the Last Year: Never true  Transportation Needs: No Transportation Needs (05/03/2022)   PRAPARE - Hydrologist (Medical): No    Lack of Transportation (Non-Medical): No  Physical Activity: Sufficiently Active (05/03/2022)   Exercise Vital Sign    Days of Exercise per Week: 4 days    Minutes of Exercise per Session: 60 min  Stress: No Stress Concern Present (05/03/2022)   [place]    Feeling of Stress : Not at all  Social Connections: Moderately Integrated (05/03/2022)   Social Connection and Isolation Panel [NHANES]    Frequency of Communication with Friends and Family: More than three times a week    Frequency of Social Gatherings with Friends and Family: More than three times a week    Attends Religious Services: More than 4 times per year    Active Member of [employer] or Organizations: Yes    Attends Music therapist: More than 4 times per year    Marital Status: Divorced    Tobacco Counseling Counseling given: Not Answered   Clinical Intake:  Pre-visit preparation completed: Yes  Pain : No/denies pain   Nutrition Risk Assessment:  Has the patient had any N/V/D within the last 2 months?  No  Does the patient have any non-healing wounds?  No  Has the patient had any unintentional weight loss or weight gain?  No   Diabetes:  Is the patient diabetic?  Yes   If diabetic, was a CBG obtained today?  No  Did the patient bring in their glucometer from home?  No  How often do you monitor your CBG's? 1-2 times weekly.   Financial Strains and Diabetes Management:  Are you having any financial strains with the device, your supplies or your medication? No .  Does the patient want to be seen by Chronic Care Management for management of their diabetes?  No  Would the patient like to be referred to a Nutritionist or for Diabetic Management?  No   Diabetic Exams:  Diabetic Eye Exam: Completed No Overdue for diabetic eye exam. Pt has been advised about the importance in completing this exam. A referral has been placed today. Message sent to referral coordinator  for scheduling purposes. Advised pt to expect a call from office referred to regarding appt.  Diabetic Foot Exam: Completed N. Pt has been advised about the importance in completing this exam. Pt is scheduled for diabetic foot exam on Followed by PCP.    BMI - recorded: 43.71 Nutritional Status: BMI > 30  Obese Nutritional Risks: None Diabetes: Yes CBG done?: No Did pt. bring in CBG monitor from home?: No  How often do you need to have someone help you when you read instructions, pamphlets, or other written materials from your doctor or pharmacy?: 1 - Never  Diabetic? Yes  Interpreter Needed?: No  Information entered by :: Rolene Arbour LPN   Activities of Daily Living    05/03/2022    9:04 AM 05/03/2022    6:14 AM  In your present state of health, do you have any difficulty performing the following activities:  Hearing? 1 1  Comment Pending Hearing Aids   Vision? 0 0  Difficulty concentrating or making decisions? 0 0  Walking or climbing stairs? 0 0  Dressing or bathing? 0 0  Doing errands, shopping? 0 0  Preparing Food and eating ? N N  Using the Toilet? N N  In the past six months, have you accidently leaked urine? N N  Do you have problems with loss of bowel control? N N   Managing your Medications? N N  Managing your Finances? N N  Housekeeping or managing your Housekeeping? N N    Patient Care Team: Billie Ruddy, MD as PCP - General (Family Medicine) Eloise Harman, DO as Consulting Physician (Internal Medicine)  Indicate any recent Medical Services you may have received from other than [facility] providers in the past year (date may be approximate).     Assessment:   This is a routine wellness examination for Vedhika.  Hearing/Vision screen Hearing Screening - Comments:: Hearing difficulties.  Pending Hearing Aids. Vision Screening - Comments:: Wears reading glasses - up to date with routine eye exams with  [facility]  Dietary issues and exercise activities discussed: Exercise limited by: None identified   Goals Addressed               This Visit's Progress     Lose weight (pt-stated)        I want to lose more weight!       Depression Screen    05/03/2022    9:03 AM 02/22/2022    9:36 AM 09/21/2021    3:41 PM 07/23/2021    8:10 AM 10/23/2020    9:45 AM 07/12/2019    8:27 AM 01/22/2017    8:05 AM  PHQ 2/9 Scores  PHQ - 2 Score 0 0 1 0 0 0 0  PHQ- 9 Score  '5 9 3 2      '$ Fall Risk    05/03/2022    9:06 AM 05/03/2022    6:14 AM 02/22/2022    9:39 AM 09/21/2021    3:41 PM 07/23/2021    8:10 AM  Fall Risk   Falls in the past year? 0 0 1 1 0  Number falls in past yr: 0 0 0 0 0  Injury with Fall? 0 0 0 0 0  Risk for fall due to : No Fall Risks  No Fall Risks No Fall Risks No Fall Risks  Follow up Falls prevention discussed  Falls evaluation completed Falls evaluation completed Falls evaluation completed    [place]  TO THE HOME:  Any stairs in or around the home? No  If so, are there any without handrails? No  Home free of loose throw rugs in walkways, pet beds, electrical cords, etc? Yes  Adequate lighting in your home to reduce risk of falls? Yes   ASSISTIVE DEVICES UTILIZED TO PREVENT  FALLS:  Life alert? No  Use of a cane, walker or w/c? No  Grab bars in the bathroom? No  Shower chair or bench in shower? No  Elevated toilet seat or a handicapped toilet? No   TIMED UP AND GO:  Was the test performed? No . Audio Visit   Cognitive Function:        05/03/2022    9:07 AM  6CIT Screen  What Year? 0 points  What month? 0 points  What time? 0 points  Count back from 20 0 points  Months in reverse 0 points  Repeat phrase 0 points  Total Score 0 points    Immunizations  There is no immunization history on file for this patient.    Flu Vaccine status: Declined, Education has been provided regarding the importance of this vaccine but patient still declined. Advised may receive this vaccine at local pharmacy or Health Dept. Aware to provide a copy of the vaccination record if obtained from local pharmacy or Health Dept. Verbalized acceptance and understanding.  Pneumococcal vaccine status: Declined,  Education has been provided regarding the importance of this vaccine but patient still declined. Advised may receive this vaccine at local pharmacy or Health Dept. Aware to provide a copy of the vaccination record if obtained from local pharmacy or Health Dept. Verbalized acceptance and understanding.   Covid-19 vaccine status: Completed vaccines  Qualifies for Shingles Vaccine? Yes   Zostavax completed No   Shingrix Completed?: No.    Education has been provided regarding the importance of this vaccine. Patient has been advised to call insurance company to determine out of pocket expense if they have not yet received this vaccine. Advised may also receive vaccine at local pharmacy or Health Dept. Verbalized acceptance and understanding.  Screening Tests Health Maintenance  Topic Date Due   DEXA SCAN  Never done   OPHTHALMOLOGY EXAM  05/03/2022 (Originally 04/29/1965)   COVID-19 Vaccine (1) 05/19/2022 (Originally 10/27/1955)   Zoster Vaccines- Shingrix (1 of 2)  05/24/2022 (Originally 04/29/2006)   INFLUENZA VACCINE  06/09/2022 (Originally 10/09/2021)   Pneumonia Vaccine 53+ Years old (1 of 1 - PCV) 02/23/2023 (Originally 04/29/2021)   HEMOGLOBIN A1C  08/24/2022   MAMMOGRAM  10/20/2022   Diabetic kidney evaluation - eGFR measurement  02/23/2023   Diabetic kidney evaluation - Urine ACR  02/23/2023   FOOT EXAM  02/23/2023   Medicare Annual Wellness (AWV)  05/04/2023   COLONOSCOPY (Pts 45-51yr Insurance coverage will need to be confirmed)  11/18/2024   Hepatitis C Screening  Completed   HPV VACCINES  Aged Out   DTaP/Tdap/Td  Discontinued    Health Maintenance  Health Maintenance Due  Topic Date Due   DEXA SCAN  Never done    Colorectal cancer screening: Type of screening: Colonoscopy. Completed 11/19/14. Repeat every 10 years  Mammogram status: Completed 10/19/21. Repeat every year  Bone Density status: Ordered 02/22/22. Pt provided with contact info and advised to call to schedule appt.  Lung Cancer Screening: (Low Dose CT Chest recommended if Age 66-80years, 30 pack-year currently smoking OR have quit w/in 15years.) does not qualify.     Additional Screening:  Hepatitis  C Screening: does qualify; Completed 01/13/13  Vision Screening: Recommended annual ophthalmology exams for early detection of glaucoma and other disorders of the eye. Is the patient up to date with their annual eye exam?  Yes  Who is the provider or what is the name of the office in which the patient attends annual eye exams? [facility] If pt is not established with a provider, would they like to be referred to a provider to establish care? No .   Dental Screening: Recommended annual dental exams for proper oral hygiene  Community Resource Referral / Chronic Care Management:  CRR required this visit?  No   CCM required this visit?  No      Plan:     I have personally reviewed and noted the following in the patient's chart:   Medical and social  history Use of alcohol, tobacco or illicit drugs  Current medications and supplements including opioid prescriptions. Patient is not currently taking opioid prescriptions. Functional ability and status Nutritional status Physical activity Advanced directives List of other physicians Hospitalizations, surgeries, and ER visits in previous 12 months Vitals Screenings to include cognitive, depression, and falls Referrals and appointments  In addition, I have reviewed and discussed with patient certain preventive protocols, quality metrics, and best practice recommendations. A written personalized care plan for preventive services as well as general preventive health recommendations were provided to patient.     Criselda Peaches, LPN   [id number]   Nurse Notes: None

## 2022-05-03 NOTE — Patient Instructions (Addendum)
Caroline Velez , Thank you for taking time to come for your Medicare Wellness Visit. I appreciate your ongoing commitment to your health goals. Please review the following plan we discussed and let me know if I can assist you in the future.   These are the goals we discussed:  Goals       Lose weight (pt-stated)      I want to lose more weight!        This is a list of the screening recommended for you and due dates:  Health Maintenance  Topic Date Due   DEXA scan (bone density measurement)  Never done   Eye exam for diabetics  05/03/2022*   COVID-19 Vaccine (1) 05/19/2022*   Zoster (Shingles) Vaccine (1 of 2) 05/24/2022*   Flu Shot  06/09/2022*   Pneumonia Vaccine (1 of 1 - PCV) 02/23/2023*   Hemoglobin A1C  08/24/2022   Mammogram  10/20/2022   Yearly kidney function blood test for diabetes  02/23/2023   Yearly kidney health urinalysis for diabetes  02/23/2023   Complete foot exam   02/23/2023   Medicare Annual Wellness Visit  05/04/2023   Colon Cancer Screening  11/18/2024   Hepatitis C Screening: USPSTF Recommendation to screen - Ages 18-79 yo.  Completed   HPV Vaccine  Aged Out   DTaP/Tdap/Td vaccine  Discontinued  *Topic was postponed. The date shown is not the original due date.    Advanced directives: Advance directive discussed with you today. I have provided a copy for you to complete at home and have notarized. Once this is complete please bring a copy in to our office so we can scan it into your chart.   Conditions/risks identified: None  Next appointment: Follow up in one year for your annual wellness visit    Preventive Care 65 Years and Older, Female Preventive care refers to lifestyle choices and visits with your health care provider that can promote health and wellness. What does preventive care include? A yearly physical exam. This is also called an annual well check. Dental exams once or twice a year. Routine eye exams. Ask your health care provider how  often you should have your eyes checked. Personal lifestyle choices, including: Daily care of your teeth and gums. Regular physical activity. Eating a healthy diet. Avoiding tobacco and drug use. Limiting alcohol use. Practicing safe sex. Taking low-dose aspirin every day. Taking vitamin and mineral supplements as recommended by your health care provider. What happens during an annual well check? The services and screenings done by your health care provider during your annual well check will depend on your age, overall health, lifestyle risk factors, and family history of disease. Counseling  Your health care provider may ask you questions about your: Alcohol use. Tobacco use. Drug use. Emotional well-being. Home and relationship well-being. Sexual activity. Eating habits. History of falls. Memory and ability to understand (cognition). Work and work Statistician. Reproductive health. Screening  You may have the following tests or measurements: Height, weight, and BMI. Blood pressure. Lipid and cholesterol levels. These may be checked every 5 years, or more frequently if you are over 72 years old. Skin check. Lung cancer screening. You may have this screening every year starting at age 78 if you have a 30-pack-year history of smoking and currently smoke or have quit within the past 15 years. Fecal occult blood test (FOBT) of the stool. You may have this test every year starting at age 21. Flexible sigmoidoscopy or colonoscopy. You  may have a sigmoidoscopy every 5 years or a colonoscopy every 10 years starting at age 17. Hepatitis C blood test. Hepatitis B blood test. Sexually transmitted disease (STD) testing. Diabetes screening. This is done by checking your blood sugar (glucose) after you have not eaten for a while (fasting). You may have this done every 1-3 years. Bone density scan. This is done to screen for osteoporosis. You may have this done starting at age 64. Mammogram.  This may be done every 1-2 years. Talk to your health care provider about how often you should have regular mammograms. Talk with your health care provider about your test results, treatment options, and if necessary, the need for more tests. Vaccines  Your health care provider may recommend certain vaccines, such as: Influenza vaccine. This is recommended every year. Tetanus, diphtheria, and acellular pertussis (Tdap, Td) vaccine. You may need a Td booster every 10 years. Zoster vaccine. You may need this after age 17. Pneumococcal 13-valent conjugate (PCV13) vaccine. One dose is recommended after age 75. Pneumococcal polysaccharide (PPSV23) vaccine. One dose is recommended after age 54. Talk to your health care provider about which screenings and vaccines you need and how often you need them. This information is not intended to replace advice given to you by your health care provider. Make sure you discuss any questions you have with your health care provider. Document Released: 03/24/2015 Document Revised: 11/15/2015 Document Reviewed: 12/27/2014 Elsevier Interactive Patient Education  2017 [place] Prevention in the Home Falls can cause injuries. They can happen to people of all ages. There are many things you can do to make your home safe and to help prevent falls. What can I do on the outside of my home? Regularly fix the edges of walkways and driveways and fix any cracks. Remove anything that might make you trip as you walk through a door, such as a raised step or threshold. Trim any bushes or trees on the path to your home. Use bright outdoor lighting. Clear any walking paths of anything that might make someone trip, such as rocks or tools. Regularly check to see if handrails are loose or broken. Make sure that both sides of any steps have handrails. Any raised decks and porches should have guardrails on the edges. Have any leaves, snow, or ice cleared regularly. Use sand  or salt on walking paths during winter. Clean up any spills in your garage right away. This includes oil or grease spills. What can I do in the bathroom? Use night lights. Install grab bars by the toilet and in the tub and shower. Do not use towel bars as grab bars. Use non-skid mats or decals in the tub or shower. If you need to sit down in the shower, use a plastic, non-slip stool. Keep the floor dry. Clean up any water that spills on the floor as soon as it happens. Remove soap buildup in the tub or shower regularly. Attach bath mats securely with double-sided non-slip rug tape. Do not have throw rugs and other things on the floor that can make you trip. What can I do in the bedroom? Use night lights. Make sure that you have a light by your bed that is easy to reach. Do not use any sheets or blankets that are too big for your bed. They should not hang down onto the floor. Have a firm chair that has side arms. You can use this for support while you get dressed. Do not have throw  rugs and other things on the floor that can make you trip. What can I do in the kitchen? Clean up any spills right away. Avoid walking on wet floors. Keep items that you use a lot in easy-to-reach places. If you need to reach something above you, use a strong step stool that has a grab bar. Keep electrical cords out of the way. Do not use floor polish or wax that makes floors slippery. If you must use wax, use non-skid floor wax. Do not have throw rugs and other things on the floor that can make you trip. What can I do with my stairs? Do not leave any items on the stairs. Make sure that there are handrails on both sides of the stairs and use them. Fix handrails that are broken or loose. Make sure that handrails are as long as the stairways. Check any carpeting to make sure that it is firmly attached to the stairs. Fix any carpet that is loose or worn. Avoid having throw rugs at the top or bottom of the stairs.  If you do have throw rugs, attach them to the floor with carpet tape. Make sure that you have a light switch at the top of the stairs and the bottom of the stairs. If you do not have them, ask someone to add them for you. What else can I do to help prevent falls? Wear shoes that: Do not have high heels. Have rubber bottoms. Are comfortable and fit you well. Are closed at the toe. Do not wear sandals. If you use a stepladder: Make sure that it is fully opened. Do not climb a closed stepladder. Make sure that both sides of the stepladder are locked into place. Ask someone to hold it for you, if possible. Clearly mark and make sure that you can see: Any grab bars or handrails. First and last steps. Where the edge of each step is. Use tools that help you move around (mobility aids) if they are needed. These include: Canes. Walkers. Scooters. Crutches. Turn on the lights when you go into a dark area. Replace any light bulbs as soon as they burn out. Set up your furniture so you have a clear path. Avoid moving your furniture around. If any of your floors are uneven, fix them. If there are any pets around you, be aware of where they are. Review your medicines with your doctor. Some medicines can make you feel dizzy. This can increase your chance of falling. Ask your doctor what other things that you can do to help prevent falls. This information is not intended to replace advice given to you by your health care provider. Make sure you discuss any questions you have with your health care provider. Document Released: 12/22/2008 Document Revised: 08/03/2015 Document Reviewed: 04/01/2014 Elsevier Interactive Patient Education  2017 [employer].

## 2022-05-03 NOTE — Telephone Encounter (Signed)
Surgical clearance received from Dr. Grier Mitts- "Pt may hold ASA '81mg'$  7 days prior to surgery". Message sent to [place], [place]

## 2022-05-06 ENCOUNTER — Ambulatory Visit (INDEPENDENT_AMBULATORY_CARE_PROVIDER_SITE_OTHER): Payer: Medicare PPO

## 2022-05-06 DIAGNOSIS — E538 Deficiency of other specified B group vitamins: Secondary | ICD-10-CM

## 2022-05-06 MED ORDER — CYANOCOBALAMIN 1000 MCG/ML IJ SOLN
1000.0000 ug | Freq: Once | INTRAMUSCULAR | Status: AC
  Administered 2022-05-06: 1000 ug via INTRAMUSCULAR

## 2022-05-06 NOTE — Progress Notes (Signed)
Per orders of Dr. Volanda Napoleon, injection of Cyanocobalamin 1000 mcg given by Encarnacion Slates on Left Deltoid. . Patient tolerated injection well.

## 2022-05-07 ENCOUNTER — Other Ambulatory Visit: Payer: Self-pay

## 2022-05-07 ENCOUNTER — Encounter (HOSPITAL_BASED_OUTPATIENT_CLINIC_OR_DEPARTMENT_OTHER): Payer: Self-pay | Admitting: Plastic Surgery

## 2022-05-09 ENCOUNTER — Encounter (HOSPITAL_BASED_OUTPATIENT_CLINIC_OR_DEPARTMENT_OTHER)
Admission: RE | Admit: 2022-05-09 | Discharge: 2022-05-09 | Disposition: A | Discharge status: Home / Self Care | Payer: Medicare PPO | Source: Ambulatory Visit | Attending: Plastic Surgery | Admitting: Plastic Surgery

## 2022-05-09 DIAGNOSIS — Z01818 Encounter for other preprocedural examination: Secondary | ICD-10-CM | POA: Diagnosis not present

## 2022-05-09 DIAGNOSIS — E119 Type 2 diabetes mellitus without complications: Secondary | ICD-10-CM | POA: Insufficient documentation

## 2022-05-09 LAB — BASIC METABOLIC PANEL WITH GFR
Anion gap: 13 (ref 5–15)
BUN: 8 mg/dL (ref 8–23)
CO2: 24 mmol/L (ref 22–32)
Calcium: 9.3 mg/dL (ref 8.9–10.3)
Chloride: 105 mmol/L (ref 98–111)
Creatinine, Ser: 0.97 mg/dL (ref 0.44–1.00)
GFR, Estimated: >60 mL/min
Glucose, Bld: 110 mg/dL — ABNORMAL HIGH (ref 70–99)
Potassium: 4 mmol/L (ref 3.5–5.1)
Sodium: 142 mmol/L (ref 135–145)

## 2022-05-14 ENCOUNTER — Other Ambulatory Visit: Payer: Self-pay

## 2022-05-14 ENCOUNTER — Encounter (HOSPITAL_BASED_OUTPATIENT_CLINIC_OR_DEPARTMENT_OTHER)
Admission: RE | Disposition: A | Discharge status: Home / Self Care | Payer: Self-pay | Source: Home / Self Care | Attending: Plastic Surgery

## 2022-05-14 ENCOUNTER — Ambulatory Visit (HOSPITAL_BASED_OUTPATIENT_CLINIC_OR_DEPARTMENT_OTHER)
Admission: RE | Admit: 2022-05-14 | Discharge: 2022-05-14 | Disposition: A | Discharge status: Home / Self Care | Payer: Medicare PPO | Attending: Plastic Surgery | Admitting: Plastic Surgery

## 2022-05-14 ENCOUNTER — Ambulatory Visit (HOSPITAL_BASED_OUTPATIENT_CLINIC_OR_DEPARTMENT_OTHER): Payer: Medicare PPO | Admitting: Anesthesiology

## 2022-05-14 ENCOUNTER — Encounter (HOSPITAL_BASED_OUTPATIENT_CLINIC_OR_DEPARTMENT_OTHER): Payer: Self-pay | Admitting: Plastic Surgery

## 2022-05-14 DIAGNOSIS — I491 Atrial premature depolarization: Secondary | ICD-10-CM | POA: Diagnosis not present

## 2022-05-14 DIAGNOSIS — M7989 Other specified soft tissue disorders: Secondary | ICD-10-CM

## 2022-05-14 DIAGNOSIS — I10 Essential (primary) hypertension: Secondary | ICD-10-CM | POA: Insufficient documentation

## 2022-05-14 DIAGNOSIS — Z01818 Encounter for other preprocedural examination: Secondary | ICD-10-CM

## 2022-05-14 DIAGNOSIS — Z7984 Long term (current) use of oral hypoglycemic drugs: Secondary | ICD-10-CM | POA: Diagnosis not present

## 2022-05-14 DIAGNOSIS — Z6841 Body Mass Index (BMI) 40.0 and over, adult: Secondary | ICD-10-CM | POA: Diagnosis not present

## 2022-05-14 DIAGNOSIS — E669 Obesity, unspecified: Secondary | ICD-10-CM | POA: Diagnosis not present

## 2022-05-14 DIAGNOSIS — M1990 Unspecified osteoarthritis, unspecified site: Secondary | ICD-10-CM | POA: Insufficient documentation

## 2022-05-14 DIAGNOSIS — R519 Headache, unspecified: Secondary | ICD-10-CM | POA: Insufficient documentation

## 2022-05-14 DIAGNOSIS — E119 Type 2 diabetes mellitus without complications: Secondary | ICD-10-CM | POA: Diagnosis not present

## 2022-05-14 DIAGNOSIS — G4730 Sleep apnea, unspecified: Secondary | ICD-10-CM | POA: Insufficient documentation

## 2022-05-14 DIAGNOSIS — D171 Benign lipomatous neoplasm of skin and subcutaneous tissue of trunk: Secondary | ICD-10-CM | POA: Diagnosis not present

## 2022-05-14 HISTORY — DX: Sleep apnea, unspecified: G47.30

## 2022-05-14 HISTORY — PX: LIPOMA EXCISION: SHX5283

## 2022-05-14 HISTORY — DX: Asymptomatic varicose veins of left lower extremity: I83.92

## 2022-05-14 HISTORY — DX: Unspecified hearing loss, unspecified ear: H91.90

## 2022-05-14 HISTORY — DX: Unspecified osteoarthritis, unspecified site: M19.90

## 2022-05-14 LAB — GLUCOSE, CAPILLARY
Glucose-Capillary: 113 mg/dL — ABNORMAL HIGH (ref 70–99)
Glucose-Capillary: 130 mg/dL — ABNORMAL HIGH (ref 70–99)

## 2022-05-14 SURGERY — EXCISION LIPOMA
Anesthesia: General | Site: Back | Laterality: Right

## 2022-05-14 MED ORDER — ROCURONIUM BROMIDE 10 MG/ML (PF) SYRINGE
PREFILLED_SYRINGE | INTRAVENOUS | Status: AC
  Filled 2022-05-14: qty 10

## 2022-05-14 MED ORDER — SUGAMMADEX SODIUM 200 MG/2ML IV SOLN
INTRAVENOUS | Status: DC | PRN
  Administered 2022-05-14: 300 mg via INTRAVENOUS

## 2022-05-14 MED ORDER — LACTATED RINGERS IV SOLN: INTRAVENOUS | Status: DC

## 2022-05-14 MED ORDER — ONDANSETRON HCL 4 MG/2ML IJ SOLN
INTRAMUSCULAR | Status: AC
  Filled 2022-05-14: qty 2

## 2022-05-14 MED ORDER — FENTANYL CITRATE (PF) 100 MCG/2ML IJ SOLN
INTRAMUSCULAR | Status: DC | PRN
  Administered 2022-05-14: 50 ug via INTRAVENOUS

## 2022-05-14 MED ORDER — 0.9 % SODIUM CHLORIDE (POUR BTL) OPTIME
TOPICAL | Status: DC | PRN
  Administered 2022-05-14: 1000 mL

## 2022-05-14 MED ORDER — OXYCODONE HCL 5 MG/5ML PO SOLN: 5.0000 mg | Freq: Once | ORAL | Status: DC | PRN

## 2022-05-14 MED ORDER — PROPOFOL 10 MG/ML IV BOLUS
INTRAVENOUS | Status: AC
  Filled 2022-05-14: qty 20

## 2022-05-14 MED ORDER — HYDROMORPHONE HCL 1 MG/ML IJ SOLN: 0.2500 mg | INTRAMUSCULAR | Status: DC | PRN

## 2022-05-14 MED ORDER — CEFAZOLIN SODIUM-DEXTROSE 2-4 GM/100ML-% IV SOLN: 2.0000 g | INTRAVENOUS | Status: DC

## 2022-05-14 MED ORDER — ROCURONIUM BROMIDE 100 MG/10ML IV SOLN
INTRAVENOUS | Status: DC | PRN
  Administered 2022-05-14: 70 mg via INTRAVENOUS

## 2022-05-14 MED ORDER — FENTANYL CITRATE (PF) 100 MCG/2ML IJ SOLN
INTRAMUSCULAR | Status: AC
  Filled 2022-05-14: qty 2

## 2022-05-14 MED ORDER — AMISULPRIDE (ANTIEMETIC) 5 MG/2ML IV SOLN: 10.0000 mg | Freq: Once | INTRAVENOUS | Status: DC | PRN

## 2022-05-14 MED ORDER — ACETAMINOPHEN 500 MG PO TABS: 1000.0000 mg | ORAL_TABLET | Freq: Once | ORAL | Status: DC

## 2022-05-14 MED ORDER — LIDOCAINE HCL (CARDIAC) PF 100 MG/5ML IV SOSY
PREFILLED_SYRINGE | INTRAVENOUS | Status: DC | PRN
  Administered 2022-05-14: 60 mg via INTRAVENOUS

## 2022-05-14 MED ORDER — PROPOFOL 10 MG/ML IV BOLUS
INTRAVENOUS | Status: DC | PRN
  Administered 2022-05-14: 150 mg via INTRAVENOUS

## 2022-05-14 MED ORDER — ONDANSETRON HCL 4 MG/2ML IJ SOLN
INTRAMUSCULAR | Status: DC | PRN
  Administered 2022-05-14: 4 mg via INTRAVENOUS

## 2022-05-14 MED ORDER — CEFAZOLIN SODIUM-DEXTROSE 2-4 GM/100ML-% IV SOLN
INTRAVENOUS | Status: AC
  Filled 2022-05-14: qty 100

## 2022-05-14 MED ORDER — DEXAMETHASONE SODIUM PHOSPHATE 4 MG/ML IJ SOLN
INTRAMUSCULAR | Status: DC | PRN
  Administered 2022-05-14: 5 mg via INTRAVENOUS

## 2022-05-14 MED ORDER — LIDOCAINE-EPINEPHRINE 1 %-1:100000 IJ SOLN
INTRAMUSCULAR | Status: AC
  Filled 2022-05-14: qty 1

## 2022-05-14 MED ORDER — CHLORHEXIDINE GLUCONATE CLOTH 2 % EX PADS: 6.0000 | MEDICATED_PAD | Freq: Once | CUTANEOUS | Status: DC

## 2022-05-14 MED ORDER — DEXAMETHASONE SODIUM PHOSPHATE 10 MG/ML IJ SOLN
INTRAMUSCULAR | Status: AC
  Filled 2022-05-14: qty 1

## 2022-05-14 MED ORDER — PHENYLEPHRINE HCL (PRESSORS) 10 MG/ML IV SOLN
INTRAVENOUS | Status: DC | PRN
  Administered 2022-05-14 (×3): 160 ug via INTRAVENOUS
  Administered 2022-05-14: 80 ug via INTRAVENOUS
  Administered 2022-05-14: 160 ug via INTRAVENOUS

## 2022-05-14 MED ORDER — LIDOCAINE-EPINEPHRINE 1 %-1:100000 IJ SOLN
INTRAMUSCULAR | Status: DC | PRN
  Administered 2022-05-14: 20 mL

## 2022-05-14 MED ORDER — OXYCODONE HCL 5 MG PO TABS: 5.0000 mg | ORAL_TABLET | Freq: Once | ORAL | Status: DC | PRN

## 2022-05-14 MED ORDER — BUPIVACAINE HCL (PF) 0.25 % IJ SOLN
INTRAMUSCULAR | Status: AC
  Filled 2022-05-14: qty 30

## 2022-05-14 MED ORDER — ONDANSETRON HCL 4 MG/2ML IJ SOLN: 4.0000 mg | Freq: Once | INTRAMUSCULAR | Status: DC | PRN

## 2022-05-14 SURGICAL SUPPLY — 79 items
ADH SKN CLS APL DERMABOND .7 (GAUZE/BANDAGES/DRESSINGS) ×1
APL PRP STRL LF DISP 70% ISPRP (MISCELLANEOUS) ×1
APL SKNCLS STERI-STRIP NONHPOA (GAUZE/BANDAGES/DRESSINGS)
BAND INSRT 18 STRL LF DISP RB (MISCELLANEOUS)
BAND RUBBER #18 3X1/16 STRL (MISCELLANEOUS) IMPLANT
BENZOIN TINCTURE PRP APPL 2/3 (GAUZE/BANDAGES/DRESSINGS) IMPLANT
BIOPATCH RED 1 DISK 7.0 (GAUZE/BANDAGES/DRESSINGS) IMPLANT
BLADE CLIPPER SURG (BLADE) IMPLANT
BLADE SURG 15 STRL LF DISP TIS (BLADE) ×1 IMPLANT
BLADE SURG 15 STRL SS (BLADE) ×2
CANISTER SUCT 1200ML W/VALVE (MISCELLANEOUS) IMPLANT
CHLORAPREP W/TINT 26 (MISCELLANEOUS) ×1 IMPLANT
COVER BACK TABLE 60X90IN (DRAPES) ×1 IMPLANT
COVER MAYO STAND STRL (DRAPES) ×1 IMPLANT
DERMABOND ADVANCED .7 DNX12 (GAUZE/BANDAGES/DRESSINGS) IMPLANT
DRAIN JP 10F RND SILICONE (MISCELLANEOUS) IMPLANT
DRAPE LAPAROTOMY 100X72 PEDS (DRAPES) IMPLANT
DRAPE U-SHAPE 76X120 STRL (DRAPES) IMPLANT
DRAPE UTILITY XL STRL (DRAPES) ×1 IMPLANT
DRSG MEPILEX POST OP 4X8 (GAUZE/BANDAGES/DRESSINGS) IMPLANT
DRSG TEGADERM 2-3/8X2-3/4 SM (GAUZE/BANDAGES/DRESSINGS) IMPLANT
DRSG TELFA 3X8 NADH STRL (GAUZE/BANDAGES/DRESSINGS) IMPLANT
ELECT COATED BLADE 2.86 ST (ELECTRODE) IMPLANT
ELECT NDL BLADE 2-5/6 (NEEDLE) ×1 IMPLANT
ELECT NEEDLE BLADE 2-5/6 (NEEDLE) IMPLANT
ELECT REM PT RETURN 9FT ADLT (ELECTROSURGICAL) ×1
ELECT REM PT RETURN 9FT PED (ELECTROSURGICAL)
ELECTRODE REM PT RETRN 9FT PED (ELECTROSURGICAL) IMPLANT
ELECTRODE REM PT RTRN 9FT ADLT (ELECTROSURGICAL) IMPLANT
EVACUATOR SILICONE 100CC (DRAIN) IMPLANT
GAUZE SPONGE 2X2 STRL 8-PLY (GAUZE/BANDAGES/DRESSINGS) IMPLANT
GAUZE SPONGE 4X4 12PLY STRL LF (GAUZE/BANDAGES/DRESSINGS) IMPLANT
GAUZE XEROFORM 1X8 LF (GAUZE/BANDAGES/DRESSINGS) IMPLANT
GLOVE BIO SURGEON STRL SZ 6.5 (GLOVE) IMPLANT
GLOVE BIO SURGEON STRL SZ7.5 (GLOVE) IMPLANT
GLOVE BIO SURGEON STRL SZ8 (GLOVE) ×1 IMPLANT
GLOVE BIOGEL M STRL SZ7.5 (GLOVE) ×1 IMPLANT
GLOVE BIOGEL PI IND STRL 7.0 (GLOVE) IMPLANT
GLOVE BIOGEL PI IND STRL 7.5 (GLOVE) ×1 IMPLANT
GOWN STRL REUS W/ TWL LRG LVL3 (GOWN DISPOSABLE) ×2 IMPLANT
GOWN STRL REUS W/TWL LRG LVL3 (GOWN DISPOSABLE) ×2
GOWN STRL REUS W/TWL XL LVL3 (GOWN DISPOSABLE) IMPLANT
NDL HYPO 27GX1-1/4 (NEEDLE) ×1 IMPLANT
NDL HYPO 30GX1 BEV (NEEDLE) IMPLANT
NDL SPNL 18GX3.5 QUINCKE PK (NEEDLE) IMPLANT
NEEDLE HYPO 27GX1-1/4 (NEEDLE) ×2 IMPLANT
NEEDLE HYPO 30GX1 BEV (NEEDLE) ×1 IMPLANT
NEEDLE SPNL 18GX3.5 QUINCKE PK (NEEDLE) IMPLANT
NS IRRIG 1000ML POUR BTL (IV SOLUTION) IMPLANT
PACK BASIN DAY SURGERY FS (CUSTOM PROCEDURE TRAY) ×1 IMPLANT
PACK UNIVERSAL I (CUSTOM PROCEDURE TRAY) IMPLANT
PENCIL SMOKE EVACUATOR (MISCELLANEOUS) ×1 IMPLANT
SHEET MEDIUM DRAPE 40X70 STRL (DRAPES) IMPLANT
SLEEVE SCD COMPRESS KNEE MED (STOCKING) IMPLANT
SPONGE T-LAP 18X18 ~~LOC~~+RFID (SPONGE) IMPLANT
STAPLER VISISTAT 35W (STAPLE) ×1 IMPLANT
STRIP CLOSURE SKIN 1/2X4 (GAUZE/BANDAGES/DRESSINGS) IMPLANT
SUCTION FRAZIER HANDLE 10FR (MISCELLANEOUS)
SUCTION TUBE FRAZIER 10FR DISP (MISCELLANEOUS) IMPLANT
SUT ETHILON 4 0 PS 2 18 (SUTURE) IMPLANT
SUT MNCRL AB 4-0 PS2 18 (SUTURE) IMPLANT
SUT MON AB 3-0 SH 27 (SUTURE) ×2
SUT MON AB 3-0 SH27 (SUTURE) IMPLANT
SUT MON AB 5-0 P3 18 (SUTURE) IMPLANT
SUT PDS 3-0 CT2 (SUTURE)
SUT PDS II 3-0 CT2 27 ABS (SUTURE) IMPLANT
SUT PLAIN 5 0 P 3 18 (SUTURE) IMPLANT
SUT PROLENE 5 0 P 3 (SUTURE) IMPLANT
SUT SILK 2 0 SH (SUTURE) IMPLANT
SUT VICRYL 4-0 PS2 18IN ABS (SUTURE) IMPLANT
SWAB COLLECTION DEVICE MRSA (MISCELLANEOUS) IMPLANT
SWAB CULTURE ESWAB REG 1ML (MISCELLANEOUS) IMPLANT
SYR BULB EAR ULCER 3OZ GRN STR (SYRINGE) IMPLANT
SYR CONTROL 10ML LL (SYRINGE) ×1 IMPLANT
TOWEL GREEN STERILE FF (TOWEL DISPOSABLE) ×1 IMPLANT
TRAY DSU PREP LF (CUSTOM PROCEDURE TRAY) IMPLANT
TUBE CONNECTING 20X1/4 (TUBING) IMPLANT
TUBING INFILTRATION IT-10001 (TUBING) IMPLANT
YANKAUER SUCT BULB TIP NO VENT (SUCTIONS) IMPLANT

## 2022-05-14 NOTE — Discharge Instructions (Addendum)
Diet: High protein, low sugar.  Medications: Please take the Zofran as needed for nausea symptoms.  As for pain control, please take Tylenol 500 mg every 6 hours as needed.  The prescribed oxycodone should be taken only as needed for breakthrough pain.  Please note that this drug can make you drowsy.  Wound care: Your lipoma excision site was closed with stitches followed by Dermabond skin glue.  A bordered Mepilex dressing was placed over top.  Please leave this in place until your scheduled postoperative visit.  A drain was placed.  Please record volume output and bring to your postoperative appointment.  Please ensure that the drain is charged (concave - squeezed in) at all times.  Do not hesitate to call if you have questions or concerns.  The drain will likely be removed at your post-op visit. Shower normally beginning tomorrow.    Activity: As tolerated. Avoid heavy lifting.  Mild drainage from underneath the dressing is OK. However, please call the office should you have severe pain or swelling, wound dehiscence (if it completely opens), or if you have any fevers.      Post Anesthesia Home Care Instructions  Activity: Get plenty of rest for the remainder of the day. A responsible individual must stay with you for 24 hours following the procedure.  For the next 24 hours, DO NOT: -Drive a car -Paediatric nurse -Drink alcoholic beverages -Take any medication unless instructed by your physician -Make any legal decisions or sign important papers.  Meals: Start with liquid foods such as gelatin or soup. Progress to regular foods as tolerated. Avoid greasy, spicy, heavy foods. If nausea and/or vomiting occur, drink only clear liquids until the nausea and/or vomiting subsides. Call your physician if vomiting continues.  Special Instructions/Symptoms: Your throat may feel dry or sore from the anesthesia or the breathing tube placed in your throat during surgery. If this causes discomfort,  gargle with warm salt water. The discomfort should disappear within 24 hours.  If you had a scopolamine patch placed behind your ear for the management of post- operative nausea and/or vomiting:  1. The medication in the patch is effective for 72 hours, after which it should be removed.  Wrap patch in a tissue and discard in the trash. Wash hands thoroughly with soap and water. 2. You may remove the patch earlier than 72 hours if you experience unpleasant side effects which may include dry mouth, dizziness or visual disturbances. 3. Avoid touching the patch. Wash your hands with soap and water after contact with the patch.

## 2022-05-14 NOTE — Anesthesia Procedure Notes (Signed)
Procedure Name: Intubation Date/Time: 05/14/2022 8:40 AM  Performed by: Verita Lamb, CRNAPre-anesthesia Checklist: Patient identified, Emergency Drugs available, Suction available and Patient being monitored Patient Re-evaluated:Patient Re-evaluated prior to induction Oxygen Delivery Method: Circle system utilized Preoxygenation: Pre-oxygenation with 100% oxygen Induction Type: IV induction Ventilation: Mask ventilation without difficulty Laryngoscope Size: Mac and 4 Grade View: Grade I Tube type: Oral Tube size: 7.0 mm Number of attempts: 1 Airway Equipment and Method: Stylet and Oral airway Placement Confirmation: ETT inserted through vocal cords under direct vision, positive ETCO2, breath sounds checked- equal and bilateral and CO2 detector Secured at: 23 cm Tube secured with: Tape Dental Injury: Teeth and Oropharynx as per pre-operative assessment

## 2022-05-14 NOTE — Transfer of Care (Signed)
Immediate Anesthesia Transfer of Care Note  Patient: Caroline Velez  Procedure(s) Performed: EXCISION LIPOMA (Right: Back)  Patient Location: PACU  Anesthesia Type:General  Level of Consciousness: awake, alert , and patient cooperative  Airway & Oxygen Therapy: Patient Spontanous Breathing and Patient connected to face mask oxygen  Post-op Assessment: Report given to RN and Post -op Vital signs reviewed and stable  Post vital signs: Reviewed and stable  Last Vitals:  Vitals Value Taken Time  BP 155/69 05/14/22 0936  Temp    Pulse 101 05/14/22 0936  Resp    SpO2 94 % 05/14/22 0936  Vitals shown include unvalidated device data.  Last Pain:  Vitals:   05/14/22 0805  PainSc: 0-No pain      Patients Stated Pain Goal: 3 ([id number])  Complications: No notable events documented.

## 2022-05-14 NOTE — Op Note (Addendum)
DATE OF OPERATION: 05/14/2022  LOCATION: [facility] operating Room  PREOPERATIVE DIAGNOSIS: Right back soft tissue mass consistent with lipoma  POSTOPERATIVE DIAGNOSIS: Same  PROCEDURE: Excision of mass  SURGEON: Jeanann Lewandowsky, MD  ASSISTANT: Krista Blue, primary, Donnamarie Rossetti  EBL: 2 cc  CONDITION: Stable  COMPLICATIONS: None  INDICATION: The patient, Caroline Velez, is a 67 y.o. female born on [birth year], is here for treatment of a slowly growing soft tissue mass on the right mid back.   PROCEDURE DETAILS:  The patient was seen prior to surgery and marked.   IV antibiotics were given. The patient was taken to the operating room and given a general anesthetic.  She was transferred to the prone position and all pressure points were padded appropriately A standard time out was performed and all information was confirmed by those in the room. SCDs were placed.   The back was prepped and draped in usual sterile manner.  The tissue overlying the mass was infiltrated with a combination of half percent Marcaine and 1% lidocaine with epinephrine.  A 10 cm incision was made over the mass and dissection carried out down to the mass which was consistent with a lipoma using a combination of sharp blunt and electrocautery dissection.  The mass was removed without difficulty and measured approximately 15 cm in length and 5 cm in width.  The wound was inspected for bleeding and hemostasis was achieved with the electrocautery.  The wound was irrigated with saline and a 10 French round drain was placed in the wound and brought out through a separate stab incision.  The wound was closed in layers with 3-0 Monocryl.  The lipoma capsule was closed first and then Scarpa's fascia was closed.  The dermis was approximated with interrupted 3-0 Monocryl sutures and skin was closed with a running 4-0 Monocryl subcuticular stitch.  The incision was sealed with Dermabond.  Dressings were applied.  All  instrument needle and sponge counts were reported as correct and there were no complications The patient was allowed to wake up and taken to recovery room in stable condition at the end of the case.  There was no family waiting in the waiting room and a phone call was placed to the next of kin phone number which went to voicemail.  The advanced practice practitioner (APP) assisted throughout the case.  The APP was essential in retraction and counter traction when needed to make the case progress smoothly.  This retraction and assistance made it possible to see the tissue plans for the procedure.  The assistance was needed for blood control, tissue re-approximation and assisted with closure of the incision site.

## 2022-05-14 NOTE — Anesthesia Postprocedure Evaluation (Signed)
Anesthesia Post Note  Patient: Caroline Velez  Procedure(s) Performed: EXCISION LIPOMA (Right: Back)     Patient location during evaluation: PACU Anesthesia Type: General Level of consciousness: awake and alert, oriented and patient cooperative Pain management: pain level controlled Vital Signs Assessment: post-procedure vital signs reviewed and stable Respiratory status: spontaneous breathing, nonlabored ventilation and respiratory function stable Cardiovascular status: blood pressure returned to baseline and stable Postop Assessment: no apparent nausea or vomiting Anesthetic complications: no   No notable events documented.  Last Vitals:  Vitals:   05/14/22 1000 05/14/22 1015  BP: (!) 113/56 116/64  Pulse: 93 89  Resp: (!) 25 18  Temp:    SpO2: 97% 92%    Last Pain:  Vitals:   05/14/22 1000  PainSc: 0-No pain                 Jarome Matin Bowyn Mercier

## 2022-05-14 NOTE — Interval H&P Note (Signed)
History and Physical Interval Note:  Pt seen in the pre op holding area, no change in physical exam or indication for surgery. Surgical site marked with her assistance. Will proceed with removal of lipoma at her request.  05/14/2022 8:01 AM  Caroline Velez  has presented today for surgery, with the diagnosis of lipoma on back.  The various methods of treatment have been discussed with the patient and family. After consideration of risks, benefits and other options for treatment, the patient has consented to  Procedure(s): EXCISION LIPOMA (Right) as a surgical intervention.  The patient's history has been reviewed, patient examined, no change in status, stable for surgery.  I have reviewed the patient's chart and labs.  Questions were answered to the patient's satisfaction.     Camillia Herter

## 2022-05-14 NOTE — Anesthesia Preprocedure Evaluation (Signed)
Anesthesia Evaluation  Patient identified by MRN, date of birth, ID band Patient awake    Reviewed: Allergy & Precautions, H&P , NPO status , Patient's Chart, lab work & pertinent test results  Airway Mallampati: III  TM Distance: >3 FB Neck ROM: Full    Dental no notable dental hx. (+) Teeth Intact, Dental Advisory Given   Pulmonary sleep apnea (denies ever being given CPAP)    Pulmonary exam normal breath sounds clear to auscultation       Cardiovascular hypertension (139/83 preop, no meds- denies diagnosis), Normal cardiovascular exam Rhythm:Regular Rate:Normal     Neuro/Psych  Headaches  negative psych ROS   GI/Hepatic negative GI ROS, Neg liver ROS,,,  Endo/Other  diabetes, Well Controlled, Type 2, Oral Hypoglycemic Agents  Morbid obesityBMI 44  Renal/GU negative Renal ROS  negative genitourinary   Musculoskeletal  (+) Arthritis , Osteoarthritis,    Abdominal  (+) + obese  Peds negative pediatric ROS (+)  Hematology negative hematology ROS (+)   Anesthesia Other Findings   Reproductive/Obstetrics negative OB ROS                              Anesthesia Physical Anesthesia Plan  ASA: 3  Anesthesia Plan: General   Post-op Pain Management: Tylenol PO (pre-op)* and Toradol IV (intra-op)*   Induction: Intravenous  PONV Risk Score and Plan: Ondansetron, Dexamethasone, Midazolam and Treatment may vary due to age or medical condition  Airway Management Planned: Oral ETT  Additional Equipment: None  Intra-op Plan:   Post-operative Plan: Extubation in OR  Informed Consent: I have reviewed the patients History and Physical, chart, labs and discussed the procedure including the risks, benefits and alternatives for the proposed anesthesia with the patient or authorized representative who has indicated his/her understanding and acceptance.     Dental advisory given  Plan Discussed  with: CRNA  Anesthesia Plan Comments:          Anesthesia Quick Evaluation

## 2022-05-15 ENCOUNTER — Encounter (HOSPITAL_BASED_OUTPATIENT_CLINIC_OR_DEPARTMENT_OTHER): Payer: Self-pay | Admitting: Plastic Surgery

## 2022-05-15 LAB — SURGICAL PATHOLOGY

## 2022-05-20 ENCOUNTER — Ambulatory Visit (INDEPENDENT_AMBULATORY_CARE_PROVIDER_SITE_OTHER): Payer: Medicare PPO | Admitting: Plastic Surgery

## 2022-05-20 ENCOUNTER — Encounter: Payer: Self-pay | Admitting: Plastic Surgery

## 2022-05-20 VITALS — BP 156/84 | HR 91

## 2022-05-20 DIAGNOSIS — D171 Benign lipomatous neoplasm of skin and subcutaneous tissue of trunk: Secondary | ICD-10-CM

## 2022-05-20 NOTE — Progress Notes (Signed)
Caroline Velez returns today for her first postoperative appointment after removal of a lipoma from her back.  She states she has done well no issues.  She recorded 15 mL and 10 mL each day for the first 2 days from her drain.  She states she has not had any drain output since then however the drain itself was not charged.  On physical exam the incision is clean dry and intact with no evidence of seroma.  Drain without difficulty.  She may begin showering.  We discussed scar massage.  She will follow-up in 4 to 6 weeks unless she needs to be seen sooner.

## 2022-05-22 ENCOUNTER — Ambulatory Visit (INDEPENDENT_AMBULATORY_CARE_PROVIDER_SITE_OTHER): Payer: Medicare PPO

## 2022-05-22 VITALS — Ht 62.0 in | Wt 237.0 lb

## 2022-05-22 DIAGNOSIS — Z0000 Encounter for general adult medical examination without abnormal findings: Secondary | ICD-10-CM

## 2022-05-22 NOTE — Progress Notes (Signed)
Subjective:   Caroline Velez is a 67 y.o. female who presents for Medicare Annual (Subsequent) preventive examination.  Review of Systems    Virtual Visit via Telephone Note  I connected with  Caroline Velez on 05/22/22 at 10:45 AM EDT by telephone and verified that I am speaking with the correct person using two identifiers.  Location: Patient: Home Provider: Office Persons participating in the virtual visit: patient/Nurse Health Advisor   I discussed the limitations, risks, security and privacy concerns of performing an evaluation and management service by telephone and the availability of in person appointments. The patient expressed understanding and agreed to proceed.  Interactive audio and video telecommunications were attempted between this nurse and patient, however failed, due to patient having technical difficulties OR patient did not have access to video capability.  We continued and completed visit with audio only.  Some vital signs may be absent or patient reported.   Caroline Peaches, LPN  Cardiac Risk Factors include: advanced age (>79mn, >>79women);diabetes mellitus;hypertension     Objective:    Today's Vitals   05/22/22 1115  Weight: 237 lb (107.5 kg)  Height: '5\' 2"'$  (1.575 m)  PainSc: 0-No pain   Body mass index is 43.35 kg/m.     05/22/2022   11:22 AM 05/14/2022    8:03 AM 05/03/2022    9:07 AM 09/25/2020    4:09 PM 01/06/2014   12:26 PM 01/12/2013    5:00 AM  Advanced Directives  Does Patient Have a Medical Advance Directive? No No No No No Patient has advance directive, copy not in chart  Type of Advance Directive      [place]?      Copy requested from family  Would patient like information on creating a medical advance directive? No - Patient declined No - Patient declined No - Patient declined  No - patient declined information   Pre-existing out of facility DNR  order (yellow form or pink MOST form)      No    Current Medications (verified) Outpatient Encounter Medications as of 05/22/2022  Medication Sig   blood glucose meter kit and supplies Dispense based on patient and insurance preference. Use up to four times daily as directed. (FOR ICD-10 E10.9, E11.9).   Cyanocobalamin (VITAMIN B-12 IJ) Inject as directed once a week.   fluticasone (FLONASE) 50 MCG/ACT nasal spray Place 2 sprays into both nostrils daily.   metFORMIN (GLUCOPHAGE) 500 MG tablet TAKE 1 TABLET(500 MG) BY MOUTH TWICE DAILY WITH A MEAL   ondansetron (ZOFRAN-ODT) 4 MG disintegrating tablet Take 1 tablet (4 mg total) by mouth every 8 (eight) hours as needed for nausea or vomiting.   Vitamin D, Ergocalciferol, (DRISDOL) 1.25 MG (50000 UNIT) CAPS capsule Take 1 capsule (50,000 Units total) by mouth every 7 (seven) days.   No facility-administered encounter medications on file as of 05/22/2022.    Allergies (verified) Patient has no known allergies.   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    Sleep apnea    Varicose veins of left lower extremity    Past Surgical History:  Procedure Laterality Date   BREAST EXCISIONAL BIOPSY Bilateral    BREAST SURGERY  bilateral   LIPOMA EXCISION Right 05/14/2022   Procedure: EXCISION LIPOMA;  Surgeon: Camillia Herter, MD;  Location: [place];  Service: Plastics;  Laterality: Right;   PARTIAL HYSTERECTOMY     Family History  Problem Relation Age of Onset   Breast cancer Mother    Cancer Mother        breast   Hypertension Father    Breast cancer Paternal Aunt    Social History   Socioeconomic History   Marital status: Single    Spouse name: Not on file   Number of children: Not on file   Years of education: Not on file   Highest  education level: 12th grade  Occupational History   Not on file  Tobacco Use   Smoking status: Never   Smokeless tobacco: Never  Vaping Use   Vaping Use: Never used  Substance and Sexual Activity   Alcohol use: No   Drug use: No   Sexual activity: Not on file  Other Topics Concern   Not on file  Social History Narrative   Not on file   Social Determinants of Health   Financial Resource Strain: Low Risk  (05/22/2022)   Overall Financial Resource Strain (CARDIA)    Difficulty of Paying Living Expenses: Not hard at all  Food Insecurity: No Food Insecurity (05/22/2022)   Hunger Vital Sign    Worried About Running Out of Food in the Last Year: Never true    [place] in the Last Year: Never true  Transportation Needs: No Transportation Needs (05/03/2022)   PRAPARE - Hydrologist (Medical): No    Lack of Transportation (Non-Medical): No  Physical Activity: Sufficiently Active (05/22/2022)   Exercise Vital Sign    Days of Exercise per Week: 3 days    Minutes of Exercise per Session: 60 min  Stress: No Stress Concern Present (05/22/2022)   [place]    Feeling of Stress : Not at all  Social Connections: Moderately Integrated (05/22/2022)   Social Connection and Isolation Panel [NHANES]    Frequency of Communication with Friends and Family: More than three times a week    Frequency of Social Gatherings with Friends and Family: More than three times a week    Attends Religious Services: More than 4 times per year    Active Member of [employer] or Organizations: Yes    Attends Music therapist: More than 4 times per year    Marital Status: Divorced    Tobacco Counseling Counseling given: Not Answered   Clinical Intake:  Pre-visit preparation completed: Yes  Pain : No/denies pain Pain Score: 0-No painNutrition Risk Assessment:  Has the patient had any N/V/D within the  last 2 months?  No  Does the patient have any non-healing wounds?  No  Has the patient had any unintentional weight loss or weight gain?  No   Diabetes:  Is the patient diabetic?  Yes If diabetic, was a CBG obtained today?  No  Did the patient bring in their glucometer from home?  No  How often do you monitor your CBG's? 1 x weekly.   Financial Strains and Diabetes Management:  Are you having any financial strains with the device, your supplies or your medication? No .  Does the patient want to be seen by Chronic Care Management for management of their diabetes?  No  Would the patient like to  be referred to a Nutritionist or for Diabetic Management?  No   Diabetic Exams:  Diabetic Eye Exam: Completed No. Overdue for diabetic eye exam. Pt has been advised about the importance in completing this exam. A referral has been placed today. Message sent to referral coordinator for scheduling purposes. Advised pt to expect a call from office referred to regarding appt.  Diabetic Foot Exam: Completed No. Pt has been advised about the importance in completing this exam. Pt is scheduled for diabetic foot exam on Followed by PCP.       BMI - recorded: 43.35 Nutritional Status: BMI > 30  Obese Nutritional Risks: None Diabetes: Yes CBG done?: No Did pt. bring in CBG monitor from home?: No  How often do you need to have someone help you when you read instructions, pamphlets, or other written materials from your doctor or pharmacy?: 1 - Never  Diabetic?  Yes  Interpreter Needed?: No  Information entered by :: Rolene Arbour LPN   Activities of Daily Living    05/22/2022   11:21 AM 05/21/2022    5:36 PM  In your present state of health, do you have any difficulty performing the following activities:  Hearing? 1 1  Comment Wears hearing aids   Vision? 0 0  Difficulty concentrating or making decisions? 0 0  Walking or climbing stairs? 0 0  Dressing or bathing? 0 0  Doing errands,  shopping? 0 0  Preparing Food and eating ? N N  Using the Toilet? N N  In the past six months, have you accidently leaked urine? N N  Do you have problems with loss of bowel control? N N  Managing your Medications? N N  Managing your Finances? N N  Housekeeping or managing your Housekeeping? N N    Patient Care Team: Billie Ruddy, MD as PCP - General (Family Medicine) Eloise Harman, DO as Consulting Physician (Internal Medicine)  Indicate any recent Medical Services you may have received from other than [facility] providers in the past year (date may be approximate).     Assessment:   This is a routine wellness examination for [place].  Hearing/Vision screen Hearing Screening - Comments:: Wears hearing aids Vision Screening - Comments:: Wears rx glasses - up to date with routine eye exams with  [facility]  Dietary issues and exercise activities discussed: Exercise limited by: None identified   Goals Addressed               This Visit's Progress     Lose weight (pt-stated)        I want to lose more weight.      Depression Screen    05/22/2022   11:20 AM 05/03/2022    9:03 AM 02/22/2022    9:36 AM 09/21/2021    3:41 PM 07/23/2021    8:10 AM 10/23/2020    9:45 AM 07/12/2019    8:27 AM  PHQ 2/9 Scores  PHQ - 2 Score 0 0 0 1 0 0 0  PHQ- 9 Score   '5 9 3 2     '$ Fall Risk    05/22/2022   11:21 AM 05/21/2022    5:36 PM 05/03/2022    9:06 AM 05/03/2022    6:14 AM 02/22/2022    9:39 AM  Fall Risk   Falls in the past year? 0 0 0 0 1  Number falls in past yr: 0 0 0 0 0  Injury with Fall? 0 0 0  0 0  Risk for fall due to : No Fall Risks  No Fall Risks  No Fall Risks  Follow up Falls prevention discussed  Falls prevention discussed  Falls evaluation completed    FALL RISK PREVENTION PERTAINING TO THE HOME:  Any stairs in or around the home? Yes  If so, are there any without handrails? No  Home free of loose throw rugs in walkways, pet beds, electrical cords, etc?  Yes  Adequate lighting in your home to reduce risk of falls? Yes   ASSISTIVE DEVICES UTILIZED TO PREVENT FALLS:  Life alert? No  Use of a cane, walker or w/c? No  Grab bars in the bathroom? No  Shower chair or bench in shower? No  Elevated toilet seat or a handicapped toilet? No   TIMED UP AND GO:  Was the test performed? No . Audio Visit  Cognitive Function:        05/22/2022   11:22 AM 05/03/2022    9:07 AM  6CIT Screen  What Year? 0 points 0 points  What month? 0 points 0 points  What time? 0 points 0 points  Count back from 20 0 points 0 points  Months in reverse 0 points 0 points  Repeat phrase 0 points 0 points  Total Score 0 points 0 points    Immunizations  There is no immunization history on file for this patient.    Flu Vaccine status: Declined, Education has been provided regarding the importance of this vaccine but patient still declined. Advised may receive this vaccine at local pharmacy or Health Dept. Aware to provide a copy of the vaccination record if obtained from local pharmacy or Health Dept. Verbalized acceptance and understanding.  Pneumococcal vaccine status: Declined,  Education has been provided regarding the importance of this vaccine but patient still declined. Advised may receive this vaccine at local pharmacy or Health Dept. Aware to provide a copy of the vaccination record if obtained from local pharmacy or Health Dept. Verbalized acceptance and understanding.   Covid-19 vaccine status: Declined, Education has been provided regarding the importance of this vaccine but patient still declined. Advised may receive this vaccine at local pharmacy or Health Dept.or vaccine clinic. Aware to provide a copy of the vaccination record if obtained from local pharmacy or Health Dept. Verbalized acceptance and understanding.  Qualifies for Shingles Vaccine? Yes   Zostavax completed No   Shingrix Completed?: No.    Education has been provided regarding the  importance of this vaccine. Patient has been advised to call insurance company to determine out of pocket expense if they have not yet received this vaccine. Advised may also receive vaccine at local pharmacy or Health Dept. Verbalized acceptance and understanding.  Screening Tests Health Maintenance  Topic Date Due   DEXA SCAN  Never done   OPHTHALMOLOGY EXAM  05/22/2022 (Originally 04/29/1965)   Zoster Vaccines- Shingrix (1 of 2) 05/24/2022 (Originally 04/29/2006)   COVID-19 Vaccine (1) 06/07/2022 (Originally 10/27/1955)   INFLUENZA VACCINE  06/09/2022 (Originally 10/09/2021)   Pneumonia Vaccine 39+ Years old (1 of 1 - PCV) 02/23/2023 (Originally 04/29/2021)   HEMOGLOBIN A1C  08/24/2022   MAMMOGRAM  10/20/2022   Diabetic kidney evaluation - Urine ACR  02/23/2023   FOOT EXAM  02/23/2023   Diabetic kidney evaluation - eGFR measurement  05/09/2023   Medicare Annual Wellness (AWV)  05/22/2023   COLONOSCOPY (Pts 45-76yr Insurance coverage will need to be confirmed)  11/18/2024   Hepatitis C Screening  Completed  HPV VACCINES  Aged Out   DTaP/Tdap/Td  Discontinued    Health Maintenance  Health Maintenance Due  Topic Date Due   DEXA SCAN  Never done    Colorectal cancer screening: Type of screening: Colonoscopy. Completed 11/19/14. Repeat every 10 years  Mammogram status: Completed 10/19/21. Repeat every year  Bone Density status: Ordered 02/22/22. Pt provided with contact info and advised to call to schedule appt.  Lung Cancer Screening: (Low Dose CT Chest recommended if Age 4-80 years, 30 pack-year currently smoking OR have quit w/in 15years.) does not qualify.     Additional Screening:  Hepatitis C Screening: does qualify; Completed 01/13/13  Vision Screening: Recommended annual ophthalmology exams for early detection of glaucoma and other disorders of the eye. Is the patient up to date with their annual eye exam?  Yes  Who is the provider or what is the name of the office in  which the patient attends annual eye exams? [facility] If pt is not established with a provider, would they like to be referred to a provider to establish care? No .   Dental Screening: Recommended annual dental exams for proper oral hygiene  Community Resource Referral / Chronic Care Management:  CRR required this visit?  No   CCM required this visit?  No      Plan:     I have personally reviewed and noted the following in the patient's chart:   Medical and social history Use of alcohol, tobacco or illicit drugs  Current medications and supplements including opioid prescriptions. Patient is not currently taking opioid prescriptions. Functional ability and status Nutritional status Physical activity Advanced directives List of other physicians Hospitalizations, surgeries, and ER visits in previous 12 months Vitals Screenings to include cognitive, depression, and falls Referrals and appointments  In addition, I have reviewed and discussed with patient certain preventive protocols, quality metrics, and best practice recommendations. A written personalized care plan for preventive services as well as general preventive health recommendations were provided to patient.     Caroline Peaches, LPN   [id number]   Nurse Notes: None

## 2022-05-22 NOTE — Patient Instructions (Addendum)
Caroline Velez , Thank you for taking time to come for your Medicare Wellness Visit. I appreciate your ongoing commitment to your health goals. Please review the following plan we discussed and let me know if I can assist you in the future.   These are the goals we discussed:  Goals       Lose weight (pt-stated)      I want to lose more weight.        This is a list of the screening recommended for you and due dates:  Health Maintenance  Topic Date Due   DEXA scan (bone density measurement)  Never done   Eye exam for diabetics  05/22/2022*   Zoster (Shingles) Vaccine (1 of 2) 05/24/2022*   COVID-19 Vaccine (1) 06/07/2022*   Flu Shot  06/09/2022*   Pneumonia Vaccine (1 of 1 - PCV) 02/23/2023*   Hemoglobin A1C  08/24/2022   Mammogram  10/20/2022   Yearly kidney health urinalysis for diabetes  02/23/2023   Complete foot exam   02/23/2023   Yearly kidney function blood test for diabetes  05/09/2023   Medicare Annual Wellness Visit  05/22/2023   Colon Cancer Screening  11/18/2024   Hepatitis C Screening: USPSTF Recommendation to screen - Ages 18-79 yo.  Completed   HPV Vaccine  Aged Out   DTaP/Tdap/Td vaccine  Discontinued  *Topic was postponed. The date shown is not the original due date.    Advanced directives: Advance directive discussed with you today. Even though you declined this today, please call our office should you change your mind, and we can give you the proper paperwork for you to fill out.   Conditions/risks identified: None  Next appointment: Follow up in one year for your annual wellness visit    Preventive Care 65 Years and Older, Female Preventive care refers to lifestyle choices and visits with your health care provider that can promote health and wellness. What does preventive care include? A yearly physical exam. This is also called an annual well check. Dental exams once or twice a year. Routine eye exams. Ask your health care provider how often you should  have your eyes checked. Personal lifestyle choices, including: Daily care of your teeth and gums. Regular physical activity. Eating a healthy diet. Avoiding tobacco and drug use. Limiting alcohol use. Practicing safe sex. Taking low-dose aspirin every day. Taking vitamin and mineral supplements as recommended by your health care provider. What happens during an annual well check? The services and screenings done by your health care provider during your annual well check will depend on your age, overall health, lifestyle risk factors, and family history of disease. Counseling  Your health care provider may ask you questions about your: Alcohol use. Tobacco use. Drug use. Emotional well-being. Home and relationship well-being. Sexual activity. Eating habits. History of falls. Memory and ability to understand (cognition). Work and work Statistician. Reproductive health. Screening  You may have the following tests or measurements: Height, weight, and BMI. Blood pressure. Lipid and cholesterol levels. These may be checked every 5 years, or more frequently if you are over 58 years old. Skin check. Lung cancer screening. You may have this screening every year starting at age 60 if you have a 30-pack-year history of smoking and currently smoke or have quit within the past 15 years. Fecal occult blood test (FOBT) of the stool. You may have this test every year starting at age 106. Flexible sigmoidoscopy or colonoscopy. You may have a sigmoidoscopy every 5  years or a colonoscopy every 10 years starting at age 44. Hepatitis C blood test. Hepatitis B blood test. Sexually transmitted disease (STD) testing. Diabetes screening. This is done by checking your blood sugar (glucose) after you have not eaten for a while (fasting). You may have this done every 1-3 years. Bone density scan. This is done to screen for osteoporosis. You may have this done starting at age 66. Mammogram. This may be done  every 1-2 years. Talk to your health care provider about how often you should have regular mammograms. Talk with your health care provider about your test results, treatment options, and if necessary, the need for more tests. Vaccines  Your health care provider may recommend certain vaccines, such as: Influenza vaccine. This is recommended every year. Tetanus, diphtheria, and acellular pertussis (Tdap, Td) vaccine. You may need a Td booster every 10 years. Zoster vaccine. You may need this after age 30. Pneumococcal 13-valent conjugate (PCV13) vaccine. One dose is recommended after age 63. Pneumococcal polysaccharide (PPSV23) vaccine. One dose is recommended after age 83. Talk to your health care provider about which screenings and vaccines you need and how often you need them. This information is not intended to replace advice given to you by your health care provider. Make sure you discuss any questions you have with your health care provider. Document Released: 03/24/2015 Document Revised: 11/15/2015 Document Reviewed: 12/27/2014 Elsevier Interactive Patient Education  2017 [employer] Prevention in the Home Falls can cause injuries. They can happen to people of all ages. There are many things you can do to make your home safe and to help prevent falls. What can I do on the outside of my home? Regularly fix the edges of walkways and driveways and fix any cracks. Remove anything that might make you trip as you walk through a door, such as a raised step or threshold. Trim any bushes or trees on the path to your home. Use bright outdoor lighting. Clear any walking paths of anything that might make someone trip, such as rocks or tools. Regularly check to see if handrails are loose or broken. Make sure that both sides of any steps have handrails. Any raised decks and porches should have guardrails on the edges. Have any leaves, snow, or ice cleared regularly. Use sand or salt on  walking paths during winter. Clean up any spills in your garage right away. This includes oil or grease spills. What can I do in the bathroom? Use night lights. Install grab bars by the toilet and in the tub and shower. Do not use towel bars as grab bars. Use non-skid mats or decals in the tub or shower. If you need to sit down in the shower, use a plastic, non-slip stool. Keep the floor dry. Clean up any water that spills on the floor as soon as it happens. Remove soap buildup in the tub or shower regularly. Attach bath mats securely with double-sided non-slip rug tape. Do not have throw rugs and other things on the floor that can make you trip. What can I do in the bedroom? Use night lights. Make sure that you have a light by your bed that is easy to reach. Do not use any sheets or blankets that are too big for your bed. They should not hang down onto the floor. Have a firm chair that has side arms. You can use this for support while you get dressed. Do not have throw rugs and other things on the  floor that can make you trip. What can I do in the kitchen? Clean up any spills right away. Avoid walking on wet floors. Keep items that you use a lot in easy-to-reach places. If you need to reach something above you, use a strong step stool that has a grab bar. Keep electrical cords out of the way. Do not use floor polish or wax that makes floors slippery. If you must use wax, use non-skid floor wax. Do not have throw rugs and other things on the floor that can make you trip. What can I do with my stairs? Do not leave any items on the stairs. Make sure that there are handrails on both sides of the stairs and use them. Fix handrails that are broken or loose. Make sure that handrails are as long as the stairways. Check any carpeting to make sure that it is firmly attached to the stairs. Fix any carpet that is loose or worn. Avoid having throw rugs at the top or bottom of the stairs. If you do  have throw rugs, attach them to the floor with carpet tape. Make sure that you have a light switch at the top of the stairs and the bottom of the stairs. If you do not have them, ask someone to add them for you. What else can I do to help prevent falls? Wear shoes that: Do not have high heels. Have rubber bottoms. Are comfortable and fit you well. Are closed at the toe. Do not wear sandals. If you use a stepladder: Make sure that it is fully opened. Do not climb a closed stepladder. Make sure that both sides of the stepladder are locked into place. Ask someone to hold it for you, if possible. Clearly mark and make sure that you can see: Any grab bars or handrails. First and last steps. Where the edge of each step is. Use tools that help you move around (mobility aids) if they are needed. These include: Canes. Walkers. Scooters. Crutches. Turn on the lights when you go into a dark area. Replace any light bulbs as soon as they burn out. Set up your furniture so you have a clear path. Avoid moving your furniture around. If any of your floors are uneven, fix them. If there are any pets around you, be aware of where they are. Review your medicines with your doctor. Some medicines can make you feel dizzy. This can increase your chance of falling. Ask your doctor what other things that you can do to help prevent falls. This information is not intended to replace advice given to you by your health care provider. Make sure you discuss any questions you have with your health care provider. Document Released: 12/22/2008 Document Revised: 08/03/2015 Document Reviewed: 04/01/2014 Elsevier Interactive Patient Education  2017 [employer].

## 2022-06-05 ENCOUNTER — Encounter: Payer: Medicare PPO | Admitting: Physician Assistant

## 2022-06-18 ENCOUNTER — Ambulatory Visit (INDEPENDENT_AMBULATORY_CARE_PROVIDER_SITE_OTHER): Payer: Medicare PPO | Admitting: Physician Assistant

## 2022-06-18 DIAGNOSIS — D171 Benign lipomatous neoplasm of skin and subcutaneous tissue of trunk: Secondary | ICD-10-CM

## 2022-06-18 NOTE — Progress Notes (Signed)
Patient is a pleasant 67 year old female with PMH of right mid back lipoma s/p excision performed 05/14/2022 by Dr. Ladona Ridgel who presents to clinic for postoperative follow-up.  She was last seen here in clinic on 05/20/2022.  At that time, drain was removed without complication or difficulty.  Plan was for gentle massage of the scar and follow-up in 4 to 6 weeks.  Today, patient is doing well.  No specific complaints.  She lives alone and states that she has not been able to regularly massage the scar given the location.  On exam, linear excision site appears to be well-healed.  Minimal hypertrophy, but the scar is hyperpigmented.  A single Monocryl suture knot is removed.  Asked if there would be anybody who could assist with regular massage of her scar.  She states that her daughter and granddaughter might be able to assist with mechanical massage.  We also discussed the use of silicone scar gel or silicone tape to help mitigate the risk of scar hypertrophy and hyperpigmentation.  Picture(s) obtained of the patient and placed in the chart were with the patient's or guardian's permission.  No specific follow-up needed, but patient understands that she can call the clinic should she have any questions or concerns in the future.

## 2022-06-26 DIAGNOSIS — H903 Sensorineural hearing loss, bilateral: Secondary | ICD-10-CM | POA: Diagnosis not present

## 2022-08-22 ENCOUNTER — Telehealth: Payer: Self-pay | Admitting: Family Medicine

## 2022-08-22 NOTE — Telephone Encounter (Signed)
Checking to see if patient is taking a statin.

## 2022-09-06 NOTE — Telephone Encounter (Signed)
Noted  

## 2022-10-02 ENCOUNTER — Ambulatory Visit: Payer: Medicare PPO | Admitting: Family Medicine

## 2022-10-02 VITALS — BP 122/80 | HR 73 | Temp 98.6°F

## 2022-10-02 DIAGNOSIS — E538 Deficiency of other specified B group vitamins: Secondary | ICD-10-CM

## 2022-10-02 DIAGNOSIS — R252 Cramp and spasm: Secondary | ICD-10-CM

## 2022-10-02 DIAGNOSIS — F4321 Adjustment disorder with depressed mood: Secondary | ICD-10-CM

## 2022-10-02 DIAGNOSIS — E559 Vitamin D deficiency, unspecified: Secondary | ICD-10-CM | POA: Diagnosis not present

## 2022-10-02 DIAGNOSIS — E119 Type 2 diabetes mellitus without complications: Secondary | ICD-10-CM

## 2022-10-02 DIAGNOSIS — R202 Paresthesia of skin: Secondary | ICD-10-CM

## 2022-10-02 DIAGNOSIS — R5383 Other fatigue: Secondary | ICD-10-CM

## 2022-10-02 NOTE — Patient Instructions (Signed)
Behavioral Health Services: -to make an appointment contact the office/provider you are interested in seeing.  No referral is needed.  The below is not an all inclusive list, but will help you get started.  [email or web address] -counseling located off of [address]  [email or web address] -website helps you find providers in your area  [facility] -Faith-based counseling  [facility] -Located off of [address]. across from [employer]  Dr. Jannifer Franklin is a Therapist, sports with [employer]. [phone]  [facility]  [facility]  [facility]  -[address]  [phone] -a place in town that has counseling and Psychiatry services.

## 2022-10-02 NOTE — Progress Notes (Signed)
Established Patient Office Visit   Subjective  Patient ID: Caroline Velez, female    DOB: 1956-01-07  Age: 67 y.o. MRN: [id number]  Chief Complaint  Patient presents with   Numbness    Patient complains of numbness bilateral feet x1 month, no known injury   Dizziness    Patient complains of lightheadedness x1 month    Patient is a 67 year old female seen for ongoing concerns and follow-up.  Patient endorses feeling no energy x 1 month.  Has also noticed cramping in legs, occasional dizziness.  Patient notes dealing with the loss of her father in January.  Patient states she was there when he took his last breath which is hard for her to get out of her head.  Notes some difficulty sleeping.  Patient has not been checking blood sugar.  States moved and does not know where her meter and supplies are.  Patient notes decreased appetite may eat a spoonful of peanut butter for dinner or peanut butter sandwich.  Not really eating much throughout the day.  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    Sleep apnea    Varicose veins of left lower extremity    Past Surgical History:  Procedure Laterality Date   BREAST EXCISIONAL BIOPSY Bilateral    BREAST SURGERY     bilateral   LIPOMA EXCISION Right 05/14/2022   Procedure: EXCISION LIPOMA;  Surgeon: Santiago Glad, MD;  Location: [facility];  Service: Plastics;  Laterality: Right;   PARTIAL HYSTERECTOMY     Social History   Tobacco Use   Smoking status: Never   Smokeless tobacco: Never  Vaping Use   Vaping status: Never Used  Substance Use Topics   Alcohol use: No   Drug use: No   Family History  Problem Relation Age of Onset   Breast cancer Mother    Cancer Mother        breast   Hypertension Father    Breast cancer  Paternal Aunt    No Known Allergies    Review of Systems  Neurological:  Positive for dizziness.   Negative unless stated above    Objective:     BP 122/80 (BP Location: Left Arm, Patient Position: Sitting, Cuff Size: Large)   Pulse 73   Temp 98.6 F (37 C) (Oral)   SpO2 97%    Physical Exam Constitutional:      General: She is not in acute distress.    Appearance: Normal appearance.  HENT:     Head: Normocephalic and atraumatic.     Nose: Nose normal.     Mouth/Throat:     Mouth: Mucous membranes are moist.  Cardiovascular:     Rate and Rhythm: Normal rate and regular rhythm.     Heart sounds: Normal heart sounds. No murmur heard.    No gallop.  Pulmonary:     Effort: Pulmonary effort is normal. No respiratory distress.     Breath sounds: Normal breath sounds. No wheezing, rhonchi or rales.  Skin:    General: Skin is warm and dry.  Neurological:     Mental Status: She is alert and oriented to person, place, and time.      No results found for any visits  on 10/02/22.    Assessment & Plan:  Fatigue, unspecified type -     TSH  Grief  Muscle cramp -     Basic metabolic panel  Controlled type 2 diabetes mellitus without complication, without long-term current use of insulin (HCC) -     Hemoglobin A1c -     Vitamin B12  Paresthesia -     Vitamin B12 -     CBC with Differential/Platelet  Vitamin B12 deficiency -     Vitamin B12 -     CBC with Differential/Platelet  Vitamin D deficiency -     VITAMIN D 25 Hydroxy (Vit-D Deficiency, Fractures)  Patient advised current symptoms including fatigue likely multifactorial due to grief, hyper or hypoglycemia, vitamin deficiency.  Discussed obtaining labs.  Patient encouraged to start checking blood sugar daily given information on area [facility] providers for counseling.  Further recommendations based on results.  Return in about 4 weeks (around 10/30/2022).   Deeann Saint, MD

## 2022-10-03 ENCOUNTER — Other Ambulatory Visit: Payer: Self-pay | Admitting: Family Medicine

## 2022-10-03 DIAGNOSIS — E538 Deficiency of other specified B group vitamins: Secondary | ICD-10-CM | POA: Insufficient documentation

## 2022-10-03 DIAGNOSIS — E559 Vitamin D deficiency, unspecified: Secondary | ICD-10-CM | POA: Insufficient documentation

## 2022-10-03 LAB — BASIC METABOLIC PANEL
Creatinine, Ser: 0.92 mg/dL (ref 0.40–1.20)
GFR: 64.92 mL/min (ref >60.00)
Glucose, Bld: 76 mg/dL (ref 70–99)
Potassium: 4.3 mEq/L (ref 3.5–5.1)
Sodium: 141 mEq/L (ref 135–145)

## 2022-10-03 LAB — CBC WITH DIFFERENTIAL/PLATELET
Basophils Absolute: 0.1 10*3/uL (ref 0.0–0.1)
Basophils Relative: 1.3 % (ref 0.0–3.0)
Eosinophils Absolute: 0.2 10*3/uL (ref 0.0–0.7)
HCT: 38.2 % (ref 36.0–46.0)
Hemoglobin: 12 g/dL (ref 12.0–15.0)
Lymphs Abs: 3.1 10*3/uL (ref 0.7–4.0)
MCHC: 31.4 g/dL (ref 30.0–36.0)
Monocytes Absolute: 0.5 10*3/uL (ref 0.1–1.0)
Monocytes Relative: 8.1 % (ref 3.0–12.0)
Neutro Abs: 2.8 10*3/uL (ref 1.4–7.7)
Neutrophils Relative %: 41.5 % — ABNORMAL LOW (ref 43.0–77.0)
RBC: 4.28 Mil/uL (ref 3.87–5.11)
WBC: 6.7 10*3/uL (ref 4.0–10.5)

## 2022-10-03 LAB — VITAMIN B12: Vitamin B-12: 123 pg/mL — ABNORMAL LOW (ref 211–911)

## 2022-10-03 LAB — VITAMIN D 25 HYDROXY (VIT D DEFICIENCY, FRACTURES): VITD: 16.94 ng/mL — ABNORMAL LOW (ref 30.00–100.00)

## 2022-10-03 MED ORDER — VITAMIN D (ERGOCALCIFEROL) 1.25 MG (50000 UNIT) PO CAPS: 50000.0000 [IU] | ORAL_CAPSULE | ORAL | 0 refills | Status: DC

## 2022-10-09 ENCOUNTER — Encounter (INDEPENDENT_AMBULATORY_CARE_PROVIDER_SITE_OTHER): Payer: Self-pay

## 2022-10-28 ENCOUNTER — Encounter: Payer: Self-pay | Admitting: Family Medicine

## 2022-10-28 ENCOUNTER — Ambulatory Visit: Payer: Medicare PPO | Admitting: Family Medicine

## 2022-10-28 VITALS — BP 116/72 | HR 64 | Temp 97.9°F | Wt 241.0 lb

## 2022-10-28 DIAGNOSIS — R5383 Other fatigue: Secondary | ICD-10-CM

## 2022-10-28 DIAGNOSIS — E538 Deficiency of other specified B group vitamins: Secondary | ICD-10-CM

## 2022-10-28 DIAGNOSIS — F40298 Other specified phobia: Secondary | ICD-10-CM

## 2022-10-28 DIAGNOSIS — E119 Type 2 diabetes mellitus without complications: Secondary | ICD-10-CM | POA: Diagnosis not present

## 2022-10-28 DIAGNOSIS — R202 Paresthesia of skin: Secondary | ICD-10-CM | POA: Diagnosis not present

## 2022-10-28 DIAGNOSIS — Z7984 Long term (current) use of oral hypoglycemic drugs: Secondary | ICD-10-CM

## 2022-10-28 DIAGNOSIS — E559 Vitamin D deficiency, unspecified: Secondary | ICD-10-CM | POA: Diagnosis not present

## 2022-10-28 MED ORDER — BLOOD GLUCOSE MONITORING SUPPL DEVI: 1.0000 | Freq: Three times a day (TID) | 0 refills | Status: AC

## 2022-10-28 MED ORDER — LANCET DEVICE MISC: 1.0000 | Freq: Three times a day (TID) | 0 refills | Status: AC

## 2022-10-28 MED ORDER — METFORMIN HCL 500 MG PO TABS: 500.0000 mg | ORAL_TABLET | Freq: Two times a day (BID) | ORAL | 2 refills | Status: DC

## 2022-10-28 MED ORDER — BLOOD GLUCOSE TEST VI STRP: 1.0000 | ORAL_STRIP | Freq: Three times a day (TID) | 11 refills | Status: AC

## 2022-10-28 MED ORDER — LANCETS MISC. MISC: 1.0000 | Freq: Three times a day (TID) | 11 refills | Status: AC

## 2022-10-28 NOTE — Patient Instructions (Signed)
Technically should not be out of ergocalciferol 50,000 IU 1 pill weekly for 12 weeks (the vitamin D supplement that was sent to your pharmacy) as 12 capsules were sent in on 10/03/2022.  Check prescription bottle at home if you still have it or contact your pharmacy in regards to refills.

## 2022-10-28 NOTE — Progress Notes (Signed)
Established Patient Office Visit   Subjective  Patient ID: Caroline Velez, female    DOB: 05-28-1955  Age: 67 y.o. MRN: [id number]  Chief Complaint  Patient presents with   Medical Management of Chronic Issues    DM, fatigue, muscle cramps, tingling in left toes. Muscle cramps not as bad, tingling in left toes also still going on but states it is bearable     Patient is a 67 year old female who presents for follow-up.  Patient states has been unable to check her blood sugar as she misplaced her glucometer since moving.  States eye exam is not yet due with my eye doctor as they contact her when next appointment is due.  Patient states she completed weekly course of ergocalciferol however prescription for 12-week just written on 10/03/2022.  Patient states she is not interested in vitamin B12 injections as she does not like needles.  Vitamin B12 was 123 on 10/02/22.  Patient still having muscle cramps/spasms.    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    Sleep apnea    Varicose veins of left lower extremity    Past Surgical History:  Procedure Laterality Date   BREAST EXCISIONAL BIOPSY Bilateral    BREAST SURGERY     bilateral   LIPOMA EXCISION Right 05/14/2022   Procedure: EXCISION LIPOMA;  Surgeon: Santiago Glad, MD;  Location: [facility];  Service: Plastics;  Laterality: Right;   PARTIAL HYSTERECTOMY     Social History   Tobacco Use   Smoking status: Never   Smokeless tobacco: Never  Vaping Use   Vaping status: Never Used  Substance Use Topics   Alcohol use: No   Drug use: No   Family History  Problem Relation Age of Onset   Breast cancer Mother    Cancer Mother        breast   Hypertension Father    Breast cancer Paternal Aunt    No Known Allergies    ROS Negative  unless stated above    Objective:     BP 116/72 (BP Location: Left Arm, Patient Position: Sitting, Cuff Size: Large)   Pulse 64   Temp 97.9 F (36.6 C) (Oral)   Wt 241 lb (109.3 kg)   SpO2 97%   BMI 44.08 kg/m  BP Readings from Last 3 Encounters:  10/28/22 116/72  10/02/22 122/80  05/20/22 (!) 156/84   Wt Readings from Last 3 Encounters:  10/28/22 241 lb (109.3 kg)  05/22/22 237 lb (107.5 kg)  05/14/22 237 lb 14 oz (107.9 kg)      Physical Exam Constitutional:      General: She is not in acute distress.    Appearance: Normal appearance.  HENT:     Head: Normocephalic and atraumatic.     Nose: Nose normal.     Mouth/Throat:     Mouth: Mucous membranes are moist.  Cardiovascular:     Rate and Rhythm: Normal rate and regular rhythm.     Heart sounds: Normal heart sounds. No murmur heard.    No gallop.  Pulmonary:     Effort: Pulmonary effort is normal. No respiratory distress.     Breath sounds: Normal breath sounds. No wheezing, rhonchi or rales.  Skin:    General:  Skin is warm and dry.  Neurological:     Mental Status: She is alert and oriented to person, place, and time.      No results found for any visits on 10/28/22.    Assessment & Plan:  Controlled type 2 diabetes mellitus without complication, without long-term current use of insulin (HCC) -Hemoglobin A1c 6.4% on 10/02/2022 -Continue metformin 500 mg twice daily. -Continue lifestyle modifications -Eye exam due later this year -Foot exam up-to-date done 02/22/2022 -New Rx for glucometer given to patient.  Monitor blood sugar at home. -     metFORMIN HCl; Take 1 tablet (500 mg total) by mouth 2 (two) times daily with a meal.  Dispense: 180 tablet; Refill: 2 -     Blood Glucose Monitoring Suppl; 1 each by Does not apply route in the morning, at noon, and at bedtime. May substitute to any manufacturer covered by patient's insurance.  Dispense: 1 each; Refill: 0 -     Blood Glucose Test; 1 each by In  Vitro route in the morning, at noon, and at bedtime. May substitute to any manufacturer covered by patient's insurance.  Dispense: 100 strip; Refill: 11 -     Lancet Device; 1 each by Does not apply route in the morning, at noon, and at bedtime. May substitute to any manufacturer covered by patient's insurance.  Dispense: 1 each; Refill: 0 -     Lancets Misc.; 1 each by Does not apply route in the morning, at noon, and at bedtime. May substitute to any manufacturer covered by patient's insurance.  Dispense: 100 each; Refill: 11  Vitamin B12 deficiency -Vitamin B12 123 on 10/02/2022.  Previously 107 on 02/22/2022. -Discussed vitamin B12 injections.  Patient declines as she does not like needles. -Patient will continue with OTC vitamin B12 supplement.  Vitamin D deficiency -Vitamin D 16.94 on 10/02/2022 -Per chart review if prescription was written on 10/03/2022 patient should not be out of her go Sifrol 50,000 IU weekly x 12 weeks.  Will have patient recheck prescription bottle at home.  Once course completed will do over-the-counter 1000-2000 IUs and recheck.  Fatigue -Likely 2/2 vitamin B12 and vitamin D deficiency -OTC supplement  Paresthesias -Likely 2/2 vitamin B12 deficiency level was 123 on 10/02/2022. -OTC B12 supplement as patient does not like needles.  Needle phobia  Return in about 4 months (around 02/27/2023).   Deeann Saint, MD

## 2022-12-05 DIAGNOSIS — E119 Type 2 diabetes mellitus without complications: Secondary | ICD-10-CM | POA: Diagnosis not present

## 2022-12-26 ENCOUNTER — Ambulatory Visit: Payer: 59

## 2022-12-26 ENCOUNTER — Encounter: Payer: Self-pay | Admitting: Family Medicine

## 2022-12-26 ENCOUNTER — Ambulatory Visit (INDEPENDENT_AMBULATORY_CARE_PROVIDER_SITE_OTHER): Payer: 59 | Admitting: Family Medicine

## 2022-12-26 VITALS — BP 130/82 | HR 65 | Temp 98.0°F | Ht 62.0 in | Wt 239.2 lb

## 2022-12-26 DIAGNOSIS — R8290 Unspecified abnormal findings in urine: Secondary | ICD-10-CM | POA: Diagnosis not present

## 2022-12-26 DIAGNOSIS — M545 Low back pain, unspecified: Secondary | ICD-10-CM | POA: Diagnosis not present

## 2022-12-26 DIAGNOSIS — R34 Anuria and oliguria: Secondary | ICD-10-CM | POA: Diagnosis not present

## 2022-12-26 DIAGNOSIS — M47816 Spondylosis without myelopathy or radiculopathy, lumbar region: Secondary | ICD-10-CM | POA: Diagnosis not present

## 2022-12-26 DIAGNOSIS — S39012A Strain of muscle, fascia and tendon of lower back, initial encounter: Secondary | ICD-10-CM

## 2022-12-26 LAB — POC URINALSYSI DIPSTICK (AUTOMATED)
Bilirubin, UA: NEGATIVE
Blood, UA: NEGATIVE
Glucose, UA: NEGATIVE
Ketones, UA: NEGATIVE
Leukocytes, UA: NEGATIVE
Nitrite, UA: POSITIVE
Protein, UA: NEGATIVE
Spec Grav, UA: 1.02 (ref 1.010–1.025)
Urobilinogen, UA: 2 U/dL — AB
pH, UA: 5 (ref 5.0–8.0)

## 2022-12-26 MED ORDER — CYCLOBENZAPRINE HCL 5 MG PO TABS: 5.0000 mg | ORAL_TABLET | Freq: Every evening | ORAL | 0 refills | Status: DC | PRN

## 2022-12-26 NOTE — Patient Instructions (Signed)
Remember it can take 4-6 weeks for symptoms to fully improve.  If symptoms continue consider physical therapy.  We can place this referral for you if you would like.

## 2022-12-26 NOTE — Progress Notes (Signed)
Established Patient Office Visit   Subjective  Patient ID: Caroline Velez, female    DOB: 12-08-1955  Age: 67 y.o. MRN: [id number]  Chief Complaint  Patient presents with   Back Pain    Back pain started 2 weeks ago, on the right lower back and has moved to the center of the back, patient rate her pain a 7 out of 10     Patient is a 67 year old female seen for acute concern.  Patient endorses bilateral low back pain x 2-3 weeks.  Symptoms started 1 night after jumping back from being startled attempting to kill a spider.  Patient rates pain as a 7/10 pulling sensation.  Sensation somewhat better as initially had difficulty raising right arm.  Pain increases with prolonged standing.  Patient denies numbness, tingling, pain in LEs, loss of bowel or bladder.  Patient has tried Tylenol arthritis strength, Aleve, patches, Biofreeze, heat.    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    Sleep apnea    Varicose veins of left lower extremity    Past Surgical History:  Procedure Laterality Date   BREAST EXCISIONAL BIOPSY Bilateral    BREAST SURGERY     bilateral   LIPOMA EXCISION Right 05/14/2022   Procedure: EXCISION LIPOMA;  Surgeon: Santiago Glad, MD;  Location: [facility];  Service: Plastics;  Laterality: Right;   PARTIAL HYSTERECTOMY     Social History   Tobacco Use   Smoking status: Never   Smokeless tobacco: Never  Vaping Use    Vaping status: Never Used  Substance Use Topics   Alcohol use: No   Drug use: No   Family History  Problem Relation Age of Onset   Breast cancer Mother    Cancer Mother        breast   Hypertension Father    Breast cancer Paternal Aunt    No Known Allergies    ROS Negative unless stated above    Objective:     BP 130/82 (BP Location: Left Arm, Patient Position: Sitting, Cuff Size: Large)   Pulse 65   Temp 98 F (36.7 C) (Oral)   Ht 5\' 2"  (1.575 m)   Wt 239 lb 3.2 oz (108.5 kg)   SpO2 96%   BMI 43.75 kg/m  BP Readings from Last 3 Encounters:  12/26/22 130/82  10/28/22 116/72  10/02/22 122/80   Wt Readings from Last 3 Encounters:  12/26/22 239 lb 3.2 oz (108.5 kg)  10/28/22 241 lb (109.3 kg)  05/22/22 237 lb (107.5 kg)      Physical Exam Constitutional:      Appearance: Normal appearance.  HENT:     Head: Normocephalic and atraumatic.     Nose: Nose normal.     Mouth/Throat:     Mouth: Mucous membranes are moist.  Eyes:     Extraocular Movements: Extraocular movements intact.  Conjunctiva/sclera: Conjunctivae normal.  Cardiovascular:     Rate and Rhythm: Normal rate.  Pulmonary:     Effort: Pulmonary effort is normal.  Musculoskeletal:        General: Tenderness present.     Cervical back: Normal and normal range of motion.     Thoracic back: Normal.     Lumbar back: Tenderness and bony tenderness present. Decreased range of motion.       Back:     Right lower leg: No edema.     Left lower leg: No edema.  Skin:    General: Skin is warm and dry.  Neurological:     Mental Status: She is alert and oriented to person, place, and time. Mental status is at baseline.     Cranial Nerves: Cranial nerves 2-12 are intact.     Gait: Gait is intact.     Comments: RLE 4/5 strength at hip, LLE 5/5.  Psychiatric:        Behavior: Behavior normal.      Results for orders placed or performed in visit on 12/26/22  POCT Urinalysis Dipstick (Automated)   Result Value Ref Range   Color, UA yellow    Clarity, UA cloudy    Glucose, UA Negative Negative   Bilirubin, UA neg    Ketones, UA neg    Spec Grav, UA 1.020 1.010 - 1.025   Blood, UA neg    pH, UA 5.0 5.0 - 8.0   Protein, UA Negative Negative   Urobilinogen, UA 2.0 (A) 0.2 or 1.0 E.U./dL   Nitrite, UA pos    Leukocytes, UA Negative Negative      Assessment & Plan:  Acute bilateral low back pain without sciatica -     POCT Urinalysis Dipstick (Automated) -     Urine Culture -     DG Lumbar Spine 2-3 Views -     Cyclobenzaprine HCl; Take 1 tablet (5 mg total) by mouth at bedtime as needed.  Dispense: 30 tablet; Refill: 0  Abnormal urine -     Urine Culture  Strain of lumbar region, initial encounter  Decreased urine output  Acute low back likely due to muscle strain based on mechanism of injury.  Will obtain lumbar spine xray due to mild weakness in RLE on exam, unclear if due to effort, to evaluate for disc herniation, stenosis, foraminal narrowing, bone spurs, etc. POC UA with nitrites.  Will obtain UCX.  Patient encouraged to increase p.o. intake of fluids.  Supportive care with OTC topical analgesics, Tylenol or NSAIDs per instructions on package, heat, stretching, massage.  Given Rx for muscle relaxer.  Advised to use at night as may cause drowsiness.  Discussed likely duration of symptoms.  Consider physical therapy.  Return if symptoms worsen or fail to improve.   Deeann Saint, MD

## 2022-12-28 LAB — URINE CULTURE
MICRO NUMBER:: 15608124
SPECIMEN QUALITY:: ADEQUATE

## 2022-12-30 ENCOUNTER — Other Ambulatory Visit: Payer: Self-pay | Admitting: Family Medicine

## 2022-12-30 DIAGNOSIS — N3000 Acute cystitis without hematuria: Secondary | ICD-10-CM

## 2022-12-30 MED ORDER — AMOXICILLIN-POT CLAVULANATE 500-125 MG PO TABS: 1.0000 | ORAL_TABLET | Freq: Two times a day (BID) | ORAL | 0 refills | Status: AC

## 2023-02-27 ENCOUNTER — Ambulatory Visit (INDEPENDENT_AMBULATORY_CARE_PROVIDER_SITE_OTHER): Payer: 59 | Admitting: Family Medicine

## 2023-02-27 VITALS — BP 139/76 | HR 82 | Temp 98.0°F | Ht 62.0 in | Wt 238.2 lb

## 2023-02-27 DIAGNOSIS — Z7984 Long term (current) use of oral hypoglycemic drugs: Secondary | ICD-10-CM | POA: Diagnosis not present

## 2023-02-27 DIAGNOSIS — E119 Type 2 diabetes mellitus without complications: Secondary | ICD-10-CM | POA: Diagnosis not present

## 2023-02-27 DIAGNOSIS — E538 Deficiency of other specified B group vitamins: Secondary | ICD-10-CM

## 2023-02-27 DIAGNOSIS — Z780 Asymptomatic menopausal state: Secondary | ICD-10-CM | POA: Diagnosis not present

## 2023-02-27 DIAGNOSIS — Z8679 Personal history of other diseases of the circulatory system: Secondary | ICD-10-CM

## 2023-02-27 DIAGNOSIS — E559 Vitamin D deficiency, unspecified: Secondary | ICD-10-CM

## 2023-02-27 DIAGNOSIS — E66813 Obesity, class 3: Secondary | ICD-10-CM

## 2023-02-27 DIAGNOSIS — H906 Mixed conductive and sensorineural hearing loss, bilateral: Secondary | ICD-10-CM

## 2023-02-27 DIAGNOSIS — I10 Essential (primary) hypertension: Secondary | ICD-10-CM

## 2023-02-27 DIAGNOSIS — Z1231 Encounter for screening mammogram for malignant neoplasm of breast: Secondary | ICD-10-CM

## 2023-02-27 DIAGNOSIS — Z6841 Body Mass Index (BMI) 40.0 and over, adult: Secondary | ICD-10-CM

## 2023-02-27 LAB — POCT GLYCOSYLATED HEMOGLOBIN (HGB A1C): Hemoglobin A1C: 6.1 % — AB (ref 4.0–5.6)

## 2023-02-27 LAB — MICROALBUMIN / CREATININE URINE RATIO
Creatinine,U: 236.3 mg/dL
Microalb Creat Ratio: 0.5 mg/g (ref 0.0–30.0)
Microalb, Ur: 1.2 mg/dL (ref 0.0–1.9)

## 2023-02-27 NOTE — Progress Notes (Signed)
Established Patient Office Visit   Subjective  Patient ID: Caroline Velez, female    DOB: 06/18/1955  Age: 67 y.o. MRN: [id number]  Chief Complaint  Patient presents with   Medical Management of Chronic Issues    BG- Not taking at this time     Patient is a 67 year old female seen for follow-up.  Patient states she is doing well.  Has hearing aids but only wears the R one.  States difficulty to hear with both in.  Patient taking metformin 500 mg.  Here for microalbumin.  Had eye exam 12/25/2022.  Patient has not had mammogram or CPE this year.    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    Sleep apnea    Varicose veins of left lower extremity    Past Surgical History:  Procedure Laterality Date   BREAST EXCISIONAL BIOPSY Bilateral    BREAST SURGERY     bilateral   LIPOMA EXCISION Right 05/14/2022   Procedure: EXCISION LIPOMA;  Surgeon: Santiago Glad, MD;  Location: [facility];  Service: Plastics;  Laterality: Right;   PARTIAL HYSTERECTOMY     Social History   Tobacco Use   Smoking status: Never   Smokeless tobacco: Never  Vaping Use   Vaping status: Never Used  Substance Use Topics   Alcohol use: No   Drug use: No   Family History  Problem Relation Age of Onset   Breast cancer Mother    Cancer Mother        breast   Hypertension Father    Breast cancer Paternal Aunt    No Known Allergies    ROS Negative  unless stated above    Objective:     BP 139/76 (BP Location: Right Arm, Patient Position: Sitting, Cuff Size: Large)   Pulse 82   Temp 98 F (36.7 C) (Oral)   Ht 5\' 2"  (1.575 m)   Wt 238 lb 3.2 oz (108 kg)   SpO2 94%   BMI 43.57 kg/m  BP Readings from Last 3 Encounters:  02/27/23 139/76  12/26/22 130/82  10/28/22 116/72   Wt Readings from Last 3 Encounters:  02/27/23 238 lb 3.2 oz (108 kg)  12/26/22 239 lb 3.2 oz (108.5 kg)  10/28/22 241 lb (109.3 kg)      Physical Exam Constitutional:      General: She is not in acute distress.    Appearance: Normal appearance.  HENT:     Head: Normocephalic and atraumatic.     Nose: Nose normal.     Mouth/Throat:     Mouth: Mucous membranes are moist.  Cardiovascular:     Rate and Rhythm: Normal rate and regular rhythm.     Heart sounds: Normal heart sounds. No murmur heard.    No gallop.  Pulmonary:     Effort: Pulmonary effort is normal. No respiratory distress.     Breath  sounds: Normal breath sounds. No wheezing, rhonchi or rales.  Skin:    General: Skin is warm and dry.  Neurological:     Mental Status: She is alert and oriented to person, place, and time.      Results for orders placed or performed in visit on 02/27/23  POC HgB A1c  Result Value Ref Range   Hemoglobin A1C 6.1 (A) 4.0 - 5.6 %   HbA1c POC (<> result, manual entry)     HbA1c, POC (prediabetic range)     HbA1c, POC (controlled diabetic range)        Assessment & Plan:  Controlled type 2 diabetes mellitus without complication, without long-term current use of insulin (HCC) -     Microalbumin / creatinine urine ratio  History of high blood pressure  Asymptomatic postmenopausal state -     DG Bone Density; Future  Encounter for screening mammogram for malignant neoplasm of breast -     MM 3D DIAGNOSTIC MAMMOGRAM BILATERAL BREAST; Future  Class 3 severe obesity with serious comorbidity and body mass index (BMI) of 40.0 to 44.9 in adult,  unspecified obesity type (HCC) -Body mass index is 43.57 kg/m. -Lifestyle modification strongly encouraged.  Mixed hearing loss, bilateral -Patient encouraged to wear both hearing aids and follow-up with audiology  DM controlled.  Hemoglobin A1c 6.4% on 10/02/2022.  Obtain POC A1c and microalbumin.  Continue metformin 500 mg twice daily and lifestyle modifications.  Eye exam up-to-date done 12/25/2022.  Patient declined foot exam this visit.  Patient also declined vaccinations.  BP initially elevated.  Recheck controlled.  Bone density scan and mammogram ordered. .bmi  No follow-ups on file.   Deeann Saint, MD

## 2023-02-27 NOTE — Patient Instructions (Addendum)
An order for bone density scan and mammogram were placed.  You should receive a phone call about setting up these appointments.  Your hemoglobin A1c has improved this visit.  It is now 6.1%.  It was 6.4% on 10/02/2022.

## 2023-05-13 DIAGNOSIS — H906 Mixed conductive and sensorineural hearing loss, bilateral: Secondary | ICD-10-CM | POA: Diagnosis not present

## 2023-05-13 DIAGNOSIS — H6993 Unspecified Eustachian tube disorder, bilateral: Secondary | ICD-10-CM | POA: Diagnosis not present

## 2023-05-13 DIAGNOSIS — Z974 Presence of external hearing-aid: Secondary | ICD-10-CM | POA: Diagnosis not present

## 2023-05-13 DIAGNOSIS — H7392 Unspecified disorder of tympanic membrane, left ear: Secondary | ICD-10-CM | POA: Diagnosis not present

## 2023-05-16 ENCOUNTER — Other Ambulatory Visit: Payer: Self-pay | Admitting: Family Medicine

## 2023-05-16 DIAGNOSIS — M545 Low back pain, unspecified: Secondary | ICD-10-CM

## 2023-05-16 MED ORDER — CYCLOBENZAPRINE HCL 5 MG PO TABS: 5.0000 mg | ORAL_TABLET | Freq: Every evening | ORAL | 0 refills | Status: DC | PRN

## 2023-06-16 DIAGNOSIS — H7392 Unspecified disorder of tympanic membrane, left ear: Secondary | ICD-10-CM | POA: Diagnosis not present

## 2023-06-16 DIAGNOSIS — H906 Mixed conductive and sensorineural hearing loss, bilateral: Secondary | ICD-10-CM | POA: Diagnosis not present

## 2023-06-16 DIAGNOSIS — H6993 Unspecified Eustachian tube disorder, bilateral: Secondary | ICD-10-CM | POA: Diagnosis not present

## 2023-06-17 DIAGNOSIS — H9313 Tinnitus, bilateral: Secondary | ICD-10-CM | POA: Diagnosis not present

## 2023-06-17 DIAGNOSIS — H906 Mixed conductive and sensorineural hearing loss, bilateral: Secondary | ICD-10-CM | POA: Diagnosis not present

## 2023-06-17 DIAGNOSIS — R42 Dizziness and giddiness: Secondary | ICD-10-CM | POA: Diagnosis not present

## 2023-06-17 DIAGNOSIS — H903 Sensorineural hearing loss, bilateral: Secondary | ICD-10-CM | POA: Diagnosis not present

## 2023-07-20 ENCOUNTER — Other Ambulatory Visit: Payer: Self-pay | Admitting: Family Medicine

## 2023-07-20 DIAGNOSIS — E119 Type 2 diabetes mellitus without complications: Secondary | ICD-10-CM

## 2023-08-05 DIAGNOSIS — G9601 Cranial cerebrospinal fluid leak, spontaneous: Secondary | ICD-10-CM | POA: Diagnosis not present

## 2023-08-05 DIAGNOSIS — Q165 Congenital malformation of inner ear: Secondary | ICD-10-CM | POA: Diagnosis not present

## 2023-08-18 ENCOUNTER — Other Ambulatory Visit: Payer: Self-pay | Admitting: Family Medicine

## 2023-08-18 DIAGNOSIS — E119 Type 2 diabetes mellitus without complications: Secondary | ICD-10-CM

## 2023-08-25 ENCOUNTER — Ambulatory Visit (INDEPENDENT_AMBULATORY_CARE_PROVIDER_SITE_OTHER): Admitting: Family Medicine

## 2023-08-25 ENCOUNTER — Encounter: Payer: Self-pay | Admitting: Family Medicine

## 2023-08-25 VITALS — BP 138/84 | HR 78 | Temp 98.1°F | Ht 62.0 in | Wt 235.4 lb

## 2023-08-25 DIAGNOSIS — E66813 Obesity, class 3: Secondary | ICD-10-CM | POA: Diagnosis not present

## 2023-08-25 DIAGNOSIS — I10 Essential (primary) hypertension: Secondary | ICD-10-CM

## 2023-08-25 DIAGNOSIS — E119 Type 2 diabetes mellitus without complications: Secondary | ICD-10-CM

## 2023-08-25 DIAGNOSIS — E538 Deficiency of other specified B group vitamins: Secondary | ICD-10-CM | POA: Diagnosis not present

## 2023-08-25 DIAGNOSIS — Z7984 Long term (current) use of oral hypoglycemic drugs: Secondary | ICD-10-CM | POA: Diagnosis not present

## 2023-08-25 DIAGNOSIS — Z1231 Encounter for screening mammogram for malignant neoplasm of breast: Secondary | ICD-10-CM

## 2023-08-25 DIAGNOSIS — Z0000 Encounter for general adult medical examination without abnormal findings: Secondary | ICD-10-CM

## 2023-08-25 DIAGNOSIS — Z13820 Encounter for screening for osteoporosis: Secondary | ICD-10-CM

## 2023-08-25 DIAGNOSIS — H906 Mixed conductive and sensorineural hearing loss, bilateral: Secondary | ICD-10-CM | POA: Diagnosis not present

## 2023-08-25 DIAGNOSIS — E559 Vitamin D deficiency, unspecified: Secondary | ICD-10-CM

## 2023-08-25 DIAGNOSIS — Z6841 Body Mass Index (BMI) 40.0 and over, adult: Secondary | ICD-10-CM

## 2023-08-25 MED ORDER — METFORMIN HCL 500 MG PO TABS: 500.0000 mg | ORAL_TABLET | Freq: Two times a day (BID) | ORAL | 3 refills | Status: AC

## 2023-08-25 NOTE — Progress Notes (Signed)
 Established Patient Office Visit   Subjective  Patient ID: Natalyia Innes, female    DOB: Mar 28, 1955  Age: 68 y.o. MRN: [id number]  Chief Complaint  Patient presents with   Annual Exam    Patient is a 68 year old female seen for CPE.  Patient states she has been doing well overall.  Was seen by ENT for L sided hearing loss.  CT and MRI 06/17/23 with concerns for elevated ICP, CSF leak.  Denies headaches, dizziness, changes in vision, nausea, vomiting.  Pt has appt with Neurology next wk and surgery tentatively scheduled for Sept. pt open to scheduling mammogram and DEXA scan.  Declines shingles vaccine.    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    Sleep apnea    Varicose veins of left lower extremity    Past Surgical History:  Procedure Laterality Date   BREAST EXCISIONAL BIOPSY Bilateral    BREAST SURGERY     bilateral   LIPOMA EXCISION Right 05/14/2022   Procedure: EXCISION LIPOMA;  Surgeon: Teretha Ferguson, MD;  Location: [facility];  Service: Plastics;  Laterality: Right;   PARTIAL HYSTERECTOMY     Social History   Tobacco Use   Smoking status: Never   Smokeless tobacco: Never  Vaping Use   Vaping status: Never Used  Substance Use Topics   Alcohol use: No   Drug use: No   Family History  Problem Relation Age of Onset   Breast cancer Mother    Cancer Mother        breast   Hypertension Father    Breast cancer  Paternal Aunt    No Known Allergies  ROS Negative unless stated above    Objective:     BP 138/84 (BP Location: Left Arm, Patient Position: Sitting, Cuff Size: Normal)   Pulse 78   Temp 98.1 F (36.7 C) (Oral)   Ht 5' 2 (1.575 m)   Wt 235 lb 6.4 oz (106.8 kg)   SpO2 94%   BMI 43.06 kg/m  BP Readings from Last 3 Encounters:  08/25/23 138/84  02/27/23 139/76  12/26/22 130/82   Wt Readings from Last 3 Encounters:  08/25/23 235 lb 6.4 oz (106.8 kg)  02/27/23 238 lb 3.2 oz (108 kg)  12/26/22 239 lb 3.2 oz (108.5 kg)      Physical Exam Constitutional:      Appearance: Normal appearance.  HENT:     Head: Normocephalic and atraumatic.     Right Ear: Tympanic membrane, ear canal and external ear normal. Decreased hearing noted. There is no impacted cerumen.     Left Ear: Ear canal and external ear normal. Decreased hearing noted. No tenderness.  No middle ear effusion. There is no impacted cerumen.     Ears:     Comments: Left TM dull, flat with area of scarring.  Otosclerosis.  No perforation or erythema    Nose:  Nose normal.     Mouth/Throat:     Mouth: Mucous membranes are moist.     Pharynx: No oropharyngeal exudate or posterior oropharyngeal erythema.   Eyes:     General: No scleral icterus.    Extraocular Movements: Extraocular movements intact.     Conjunctiva/sclera: Conjunctivae normal.     Pupils: Pupils are equal, round, and reactive to light.   Neck:     Thyroid : No thyromegaly.   Cardiovascular:     Rate and Rhythm: Normal rate and regular rhythm.     Pulses: Normal pulses.     Heart sounds: Normal heart sounds. No murmur heard.    No friction rub.  Pulmonary:     Effort: Pulmonary effort is normal.     Breath sounds: Normal breath sounds. No wheezing, rhonchi or rales.  Abdominal:     General: Bowel sounds are normal.     Palpations: Abdomen is soft.     Tenderness: There is no abdominal tenderness.   Musculoskeletal:        General: No  deformity. Normal range of motion.  Lymphadenopathy:     Cervical: No cervical adenopathy.   Skin:    General: Skin is warm and dry.     Findings: No lesion.   Neurological:     General: No focal deficit present.     Mental Status: She is alert and oriented to person, place, and time.   Psychiatric:        Mood and Affect: Mood normal.        Thought Content: Thought content normal.    Diabetic Foot Exam - Simple   Simple Foot Form Diabetic Foot exam was performed with the following findings: Yes 08/25/2023  9:49 AM  Visual Inspection See comments: Yes Sensation Testing Intact to touch and monofilament testing bilaterally: Yes Pulse Check Posterior Tibialis and Dorsalis pulse intact bilaterally: Yes Comments Bilateral bunions.  Callus on medial edge of right great toe.        02/27/2023    9:02 AM 12/26/2022   10:34 AM 10/28/2022    8:37 AM  Depression screen PHQ 2/9  Decreased Interest 0 0 0  Down, Depressed, Hopeless 0 0 0  PHQ - 2 Score 0 0 0  Altered sleeping 2 1 3   Tired, decreased energy 2 0 0  Change in appetite 0 0 0  Feeling bad or failure about yourself  0 0 0  Trouble concentrating 0 0 0  Moving slowly or fidgety/restless 0 0 0  Suicidal thoughts 0 0 0  PHQ-9 Score 4 1 3   Difficult doing work/chores Not difficult at all Not difficult at all Not difficult at all      02/27/2023    9:03 AM 12/26/2022   10:34 AM 10/28/2022    8:38 AM 10/23/2020    9:46 AM  GAD 7 : Generalized Anxiety Score  Nervous, Anxious, on Edge 0 0 0 0  Control/stop worrying 1 0 0 0  Worry too much - different things 1 0 0 0  Trouble relaxing 0 0 0 1  Restless 0 0 0 0  Easily annoyed or irritable 0 0 0 0  Afraid - awful might happen 1 0 0 0  Total GAD 7 Score 3 0 0 1  Anxiety Difficulty Not difficult at all Not difficult at all  Not difficult at all     No results found for any visits on 08/25/23.    Assessment & Plan:  Well adult exam -     CBC with  Differential/Platelet; Future -     Comprehensive metabolic panel with GFR; Future -     Hemoglobin A1c; Future -     Lipid panel; Future -     T4, free; Future -     TSH; Future  Controlled type 2 diabetes mellitus without complication, without long-term current use of insulin (HCC) -     Hemoglobin A1c; Future -     Lipid panel; Future -     metFORMIN  HCl; Take 1 tablet (500 mg total) by mouth 2 (two) times daily with a meal.  Dispense: 180 tablet; Refill: 3  Class 3 severe obesity with serious comorbidity and body mass index (BMI) of 40.0 to 44.9 in adult, unspecified obesity type -     Lipid panel; Future  Essential hypertension -     Comprehensive metabolic panel with GFR; Future -     Lipid panel; Future -     T4, free; Future -     TSH; Future  Screening for osteoporosis -     DG Bone Density; Future  Encounter for screening mammogram for malignant neoplasm of breast -     Digital Screening Mammogram, Left and Right; Future  Vitamin B12 deficiency -     Vitamin B12; Future  Vitamin D  deficiency -     VITAMIN D  25 Hydroxy (Vit-D Deficiency, Fractures); Future  Mixed conductive and sensorineural hearing loss of both ears  Age-appropriate health screenings discussed.  Obtain labs.  Patient declines shingles vaccine at this time.  DEXA scan ordered and mammogram.  Pap not indicated 2/2 hysterectomy.  Colonoscopy done 63, due September 2026.  Continue to monitor BP as not currently requiring medication.  Continue lifestyle modifications.  Continue metformin  500 mg twice daily for diabetes.  Yearly vision check.    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  MRI temporal bone with and without on 06/17/2023 fluid and debris in ears and brain, brain tissue herniating down into the ear and nose and signs of high pressure inside the brain.  Continue follow-up with ENT and neurology.  Return in about 6 months (around 02/24/2024), or if symptoms worsen or fail to improve, for chronic conditions.   Viola Greulich, MD

## 2023-08-25 NOTE — Patient Instructions (Signed)
 An order for bone density scan as well as a mammogram were placed.  You should expect a phone call about setting up these appointments.

## 2023-08-26 LAB — COMPREHENSIVE METABOLIC PANEL WITH GFR
AG Ratio: 1.3 (calc) (ref 1.0–2.5)
ALT: 13 U/L (ref 6–29)
AST: 16 U/L (ref 10–35)
Albumin: 4.2 g/dL (ref 3.6–5.1)
Alkaline phosphatase (APISO): 124 U/L (ref 37–153)
BUN: 14 mg/dL (ref 7–25)
CO2: 30 mmol/L (ref 20–32)
Calcium: 9.8 mg/dL (ref 8.6–10.4)
Chloride: 104 mmol/L (ref 98–110)
Creat: 0.94 mg/dL (ref 0.50–1.05)
Globulin: 3.2 g/dL (ref 1.9–3.7)
Glucose, Bld: 105 mg/dL — ABNORMAL HIGH (ref 65–99)
Potassium: 4.6 mmol/L (ref 3.5–5.3)
Sodium: 141 mmol/L (ref 135–146)
Total Bilirubin: 0.3 mg/dL (ref 0.2–1.2)
Total Protein: 7.4 g/dL (ref 6.1–8.1)
eGFR: 67 mL/min/{1.73_m2} (ref >=60)

## 2023-08-26 LAB — CBC WITH DIFFERENTIAL/PLATELET
Absolute Lymphocytes: 2195 {cells}/uL (ref 850–3900)
Absolute Monocytes: 479 {cells}/uL (ref 200–950)
Basophils Absolute: 28 {cells}/uL (ref 0–200)
Basophils Relative: 0.5 %
Eosinophils Absolute: 121 {cells}/uL (ref 15–500)
Eosinophils Relative: 2.2 %
HCT: 38.7 % (ref 35.0–45.0)
Hemoglobin: 12.1 g/dL (ref 11.7–15.5)
MCH: 28.6 pg (ref 27.0–33.0)
MCHC: 31.3 g/dL — ABNORMAL LOW (ref 32.0–36.0)
MCV: 91.5 fL (ref 80.0–100.0)
MPV: 10.9 fL (ref 7.5–12.5)
Monocytes Relative: 8.7 %
Neutro Abs: 2679 {cells}/uL (ref 1500–7800)
Neutrophils Relative %: 48.7 %
Platelets: 324 10*3/uL (ref 140–400)
RBC: 4.23 10*6/uL (ref 3.80–5.10)
RDW: 16 % — ABNORMAL HIGH (ref 11.0–15.0)
Total Lymphocyte: 39.9 %
WBC: 5.5 10*3/uL (ref 3.8–10.8)

## 2023-08-26 LAB — T4, FREE: Free T4: 1.2 ng/dL (ref 0.8–1.8)

## 2023-08-26 LAB — HEMOGLOBIN A1C
Hgb A1c MFr Bld: 6.7 % — ABNORMAL HIGH (ref <5.7)
Mean Plasma Glucose: 146 mg/dL
eAG (mmol/L): 8.1 mmol/L

## 2023-08-26 LAB — VITAMIN D 25 HYDROXY (VIT D DEFICIENCY, FRACTURES): Vit D, 25-Hydroxy: 33 ng/mL (ref 30–100)

## 2023-08-26 LAB — VITAMIN B12: Vitamin B-12: 247 pg/mL (ref 200–1100)

## 2023-08-26 LAB — TSH: TSH: 1.16 m[IU]/L (ref 0.40–4.50)

## 2023-08-26 LAB — LIPID PANEL
Cholesterol: 259 mg/dL — ABNORMAL HIGH (ref <200)
HDL: 46 mg/dL — ABNORMAL LOW (ref >=50)
LDL Cholesterol (Calc): 184 mg/dL — ABNORMAL HIGH
Non-HDL Cholesterol (Calc): 213 mg/dL — ABNORMAL HIGH (ref <130)
Total CHOL/HDL Ratio: 5.6 (calc) — ABNORMAL HIGH (ref <5.0)
Triglycerides: 148 mg/dL (ref <150)

## 2023-08-28 DIAGNOSIS — G932 Benign intracranial hypertension: Secondary | ICD-10-CM | POA: Diagnosis not present

## 2023-08-28 DIAGNOSIS — H9192 Unspecified hearing loss, left ear: Secondary | ICD-10-CM | POA: Diagnosis not present

## 2023-08-28 DIAGNOSIS — Q165 Congenital malformation of inner ear: Secondary | ICD-10-CM | POA: Diagnosis not present

## 2023-08-29 ENCOUNTER — Ambulatory Visit: Payer: Self-pay | Admitting: Family Medicine

## 2023-09-22 ENCOUNTER — Ambulatory Visit

## 2023-10-07 DIAGNOSIS — G932 Benign intracranial hypertension: Secondary | ICD-10-CM | POA: Diagnosis not present

## 2023-10-07 DIAGNOSIS — H9192 Unspecified hearing loss, left ear: Secondary | ICD-10-CM | POA: Diagnosis not present

## 2023-10-31 DIAGNOSIS — I499 Cardiac arrhythmia, unspecified: Secondary | ICD-10-CM | POA: Diagnosis not present

## 2023-11-12 ENCOUNTER — Ambulatory Visit: Admitting: Family Medicine

## 2023-11-14 ENCOUNTER — Encounter: Payer: Self-pay | Admitting: Family Medicine

## 2023-11-14 ENCOUNTER — Telehealth: Payer: Self-pay | Admitting: Family Medicine

## 2023-11-14 ENCOUNTER — Ambulatory Visit (INDEPENDENT_AMBULATORY_CARE_PROVIDER_SITE_OTHER): Admitting: Family Medicine

## 2023-11-14 VITALS — BP 127/80 | HR 72 | Temp 98.1°F | Ht 62.0 in | Wt 234.4 lb

## 2023-11-14 DIAGNOSIS — E119 Type 2 diabetes mellitus without complications: Secondary | ICD-10-CM

## 2023-11-14 DIAGNOSIS — Z01818 Encounter for other preprocedural examination: Secondary | ICD-10-CM | POA: Diagnosis not present

## 2023-11-14 NOTE — Telephone Encounter (Signed)
 Copied from CRM #[id number]. Topic: General - Other >> Nov 14, 2023  2:12 PM Drema MATSU wrote: Reason for CRM: [facility] stated that they need patient office notes from appointment today. She is needing it for surgery. She states that she leaves at 03:30 and it can be hand written if needed. Surgery for Monday morning at 7:00am Fax: [phone]

## 2023-11-14 NOTE — Telephone Encounter (Signed)
 Forms are faxed to both fax number, with confirmation.

## 2023-11-14 NOTE — Progress Notes (Signed)
 Chief Complaint  Patient presents with   Pre-op Exam    HPI:  Patient is seen for optimization of general medical care prior to surgery. Surgery type: Patient seems unclear on actual procedure, mentions getting an inplant in L ear, then having surgery on R ear after L heals.  Per chart review a tympanomastoidectomy scheduled for 11/17/2023. Date of surgery: 11/17/2023    Kidney disease? No Prior surgeries/Issues following anesthesia?  No Hx MI, heart arrythmia, CHF, angina or stroke? none Epilepsy or Seizures? none Arthritis or problems with neck or jaw? none Thyroid  disease? none Liver disease? none Asthma, COPD or chronic lung disease? none Diabetes?  Yes (Needs to be evaluated by anesthesia if yes to these questions.)  Other: Poor nutrition, Frail or other: no  METS:  ?Can take care of self, such as eat, dress, or use the toilet (1 MET). yes ?Can walk up a flight of steps or a hill (4 METs).yes ?Can do heavy work around the house such as scrubbing floors or lifting or moving heavy furniture (between 4 and 10 METs). yes ?Can participate in strenuous sports such as swimming, singles tennis, football, basketball, and skiing (>10 METs) . AHA Risks: Major predictors that require intensive management and may lead to delay in or cancellation of the operative procedure unless emergent: NONE   Unstable coronary syndromes including unstable or severe angina or recent MI   Decompensated heart failure including NYHA functional class IV or worsening or new-onset HF   Significant arrhythmias including high grade AV block, symptomatic ventricular arrhythmias, supraventricular arrhythmias with ventricular rate >100 bpm at rest, symptomatic bradycardia, and newly recognized ventricular tachycardia   Severe heart valve disease including severe aortic stenosis or symptomatic mitral stenosis   Other clinical predictors that warrant careful assessment of current status: NONE   History of ischemic  heart disease  History of cerebrovascular disease   History of compensated heart failure or prior heart failure   Diabetes mellitus   Renal insufficiency  Type of surgery and Risk: 1) High risk (reported risk of cardiac death or nonfatal myocardial infarction [MI] often greater than 5 percent):   Aortic and other major vascular surgery   Peripheral artery surgery   2)Intermediate risk (reported risk of cardiac death or nonfatal MI generally 1 to 5 percent):   Carotid endarterectomy   Head and neck surgery   Intraperitoneal and intrathoracic surgery   Orthopedic surgery   Prostate surgery   3)Low risk (reported risk of cardiac death or nonfatal MI generally less than 1 percent):   Ambulatory surgery   Endoscopic procedures   Superficial procedure   Cataract surgery   Breast surgery  Medications that need to be addressed prior to surgery: None Discontinue acei/arbs/non-statin lipid lowering drugs day of surgery ASA stop 7 days before or discuss with cardiology if CV risks, other anticoagulants discuss with cardiology.   ROS: See pertinent positives and negatives per HPI. 11 point ROS negative except where noted.  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    Sleep apnea    Varicose veins of left lower extremity     Past Surgical History:  Procedure Laterality Date   BREAST EXCISIONAL BIOPSY Bilateral    BREAST SURGERY     bilateral   LIPOMA EXCISION Right  05/14/2022   Procedure: EXCISION LIPOMA;  Surgeon: Waddell Leonce NOVAK, MD;  Location: [facility];  Service: Plastics;  Laterality: Right;   PARTIAL HYSTERECTOMY      Family History  Problem Relation Age of Onset   Breast cancer Mother    Cancer Mother        breast   Hypertension Father    Breast cancer Paternal Aunt     Social  History   Socioeconomic History   Marital status: Single    Spouse name: Not on file   Number of children: Not on file   Years of education: Not on file   Highest education level: 12th grade  Occupational History   Not on file  Tobacco Use   Smoking status: Never   Smokeless tobacco: Never  Vaping Use   Vaping status: Never Used  Substance and Sexual Activity   Alcohol use: No   Drug use: No   Sexual activity: Not on file  Other Topics Concern   Not on file  Social History Narrative   Not on file   Social Drivers of Health   Financial Resource Strain: Low Risk  (08/21/2023)   Overall Financial Resource Strain (CARDIA)    Difficulty of Paying Living Expenses: Not hard at all  Food Insecurity: No Food Insecurity (08/21/2023)   Hunger Vital Sign    Worried About Running Out of Food in the Last Year: Never true    Ran Out of Food in the Last Year: Never true  Transportation Needs: No Transportation Needs (08/21/2023)   PRAPARE - Administrator, Civil Service (Medical): No    Lack of Transportation (Non-Medical): No  Physical Activity: Insufficiently Active (08/21/2023)   Exercise Vital Sign    Days of Exercise per Week: 2 days    Minutes of Exercise per Session: 30 min  Stress: No Stress Concern Present (08/21/2023)   [employer] of Occupational Health - Occupational Stress Questionnaire    Feeling of Stress: Only a little  Social Connections: Moderately Isolated (08/21/2023)   Social Connection and Isolation Panel    Frequency of Communication with Friends and Family: More than three times a week    Frequency of Social Gatherings with Friends and Family: Twice a week    Attends Religious Services: More than 4 times per year    Active Member of [employer] or Organizations: No    Attends Engineer, structural: Not on file    Marital Status: Divorced     Current Outpatient Medications:    metFORMIN  (GLUCOPHAGE ) 500 MG tablet, Take 1 tablet (500 mg  total) by mouth 2 (two) times daily with a meal., Disp: 180 tablet, Rfl: 3   ACCU-CHEK GUIDE test strip, USE 1 STRIP TO CHECK GLUCOSE IN THE MORNING, AT NOON AND AT BEDTIME AS DIRECTED (Patient not taking: Reported on 11/14/2023), Disp: , Rfl:    Accu-Chek Softclix Lancets lancets, 1 each 3 (three) times daily. (Patient not taking: Reported on 11/14/2023), Disp: , Rfl:    aspirin  EC 81 MG tablet, Take 81 mg by mouth daily. Swallow whole. (Patient not taking: Reported on 11/14/2023), Disp: , Rfl:    Blood Glucose Monitoring Suppl DEVI, 1 each by Does not apply route in the morning, at noon, and at bedtime. May substitute to any manufacturer covered by patient's insurance. (Patient not taking: Reported on 11/14/2023), Disp: 1 each, Rfl: 0   VITAMIN D  PO, Take by mouth. (Patient not taking: Reported on 11/14/2023),  Disp: , Rfl:   EXAM:  Vitals:   11/14/23 1323 11/14/23 1357  BP: (!) 144/74 127/80  Pulse: 72   Temp: 98.1 F (36.7 C)   SpO2: 97%     Body mass index is 42.87 kg/m.  GENERAL: vitals reviewed and listed above, alert, oriented, appears well hydrated and in no acute distress  HEENT: HOH.  Preop preop atraumatic, conjunttiva clear, no obvious abnormalities on inspection of external nose and ears  NECK: no obvious masses on inspection, no carotid bruits  LUNGS: clear to auscultation bilaterally, no wheezes, rales or rhonchi, good air movement  CV: HRRR, no peripheral edema, no JVD, BP normal range, normal radial pulses  MS: moves all extremities without noticeable abnormality  PSYCH: pleasant and cooperative, no obvious depression or anxiety  ASSESSMENT AND PLAN:  Discussed the following assessment and plan:  Preop examination  Controlled type 2 diabetes mellitus without complication, without long-term current use of insulin (HCC)  Assessment: -Risk factors: none -Surgery Risks:intermediate -age, nutritional status, fraility: good nutritional status, age >94, no  fraility -functional capacity: > 4 METs without symptoms -comorbidities: none Patient Specific Risks: patient is low risk for intermediate risks surgery   Recommendations for optimizing general medical care prior to surgery: -advised patient to discuss specific risks morbidity and mortality of surgery with surgeon, CV risks discussed with patient -advised patient will defer to surgeon for post-op DVT prophylaxis and post op care -no specific medical recommendations for this patient at this time and no recommendations to defer surgery or for further CV testing prior to surgery -form for pre-op optimization of general medical care prior to surgery faxed to surgeon office  -Patient advised to return or notify a doctor immediately if symptoms worsen or persist or new concerns arise.  Patient unclear on details of surgery.  Per chart review a left tympanomastoidectomy scheduled for 11/17/2023.  No preop risk forms sent only a form requesting recent office note from anesthesia.  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  MRI of temporal bone with and without on/8/25 with fluid and debris in the ears and brain, brain tissue herniating down into the ear and nose, and signs of high pressure inside the brain.  LP recently done by neurosurgery without improvement in tinnitus.      There are no Patient Instructions on file for this visit.   Caroline Velez Single

## 2023-11-14 NOTE — Telephone Encounter (Signed)
 Copied from CRM [id number]. Topic: Medical Record Request - Provider/Facility Request >> Nov 14, 2023  3:30 PM Sasha M wrote: Reason for CRM: Doyal from [facility] called in to request today's appt notes for surgery on Monday. She asks them to be faxed to two different numbers in case she misses it then one copy will go to her supervisor. Fax numbers are [phone] and [phone]

## 2023-11-17 DIAGNOSIS — Q165 Congenital malformation of inner ear: Secondary | ICD-10-CM | POA: Diagnosis not present

## 2023-11-17 DIAGNOSIS — M898X8 Other specified disorders of bone, other site: Secondary | ICD-10-CM | POA: Diagnosis not present

## 2023-11-17 DIAGNOSIS — E119 Type 2 diabetes mellitus without complications: Secondary | ICD-10-CM | POA: Diagnosis not present

## 2023-11-17 DIAGNOSIS — G9608 Other cranial cerebrospinal fluid leak: Secondary | ICD-10-CM | POA: Diagnosis not present

## 2023-11-17 DIAGNOSIS — H9192 Unspecified hearing loss, left ear: Secondary | ICD-10-CM | POA: Diagnosis not present

## 2023-11-17 DIAGNOSIS — H748X2 Other specified disorders of left middle ear and mastoid: Secondary | ICD-10-CM | POA: Diagnosis not present

## 2023-11-17 DIAGNOSIS — Q018 Encephalocele of other sites: Secondary | ICD-10-CM | POA: Diagnosis not present

## 2023-11-17 DIAGNOSIS — G9601 Cranial cerebrospinal fluid leak, spontaneous: Secondary | ICD-10-CM | POA: Diagnosis not present

## 2023-11-17 DIAGNOSIS — Z7984 Long term (current) use of oral hypoglycemic drugs: Secondary | ICD-10-CM | POA: Diagnosis not present

## 2023-11-25 ENCOUNTER — Telehealth: Payer: Self-pay | Admitting: *Deleted

## 2023-11-25 DIAGNOSIS — R030 Elevated blood-pressure reading, without diagnosis of hypertension: Secondary | ICD-10-CM | POA: Diagnosis not present

## 2023-11-25 DIAGNOSIS — E785 Hyperlipidemia, unspecified: Secondary | ICD-10-CM

## 2023-11-25 MED ORDER — ROSUVASTATIN CALCIUM 20 MG PO TABS: 20.0000 mg | ORAL_TABLET | Freq: Every day | ORAL | 3 refills | Status: AC

## 2023-11-25 NOTE — Telephone Encounter (Signed)
 Medication sent to the pharmacy per result note 6/16 labs

## 2023-11-25 NOTE — Telephone Encounter (Signed)
 Copied from CRM [id number]. Topic: Clinical - Lab/Test Results >> Nov 25, 2023 12:37 PM Caroline Velez wrote: Reason for CRM: Patient looked over her test results for her cholesterol and it said that something can be called in for her  it  and she would like for it to be called in to the  [id number] - [place], [place] - [address] [place] [address] Phone: [phone] Fax: [phone]

## 2023-11-27 DIAGNOSIS — Z09 Encounter for follow-up examination after completed treatment for conditions other than malignant neoplasm: Secondary | ICD-10-CM | POA: Diagnosis not present

## 2023-11-27 DIAGNOSIS — Z9889 Other specified postprocedural states: Secondary | ICD-10-CM | POA: Diagnosis not present

## 2024-01-08 DIAGNOSIS — Z09 Encounter for follow-up examination after completed treatment for conditions other than malignant neoplasm: Secondary | ICD-10-CM | POA: Diagnosis not present

## 2024-01-08 DIAGNOSIS — Z9889 Other specified postprocedural states: Secondary | ICD-10-CM | POA: Diagnosis not present

## 2024-04-06 ENCOUNTER — Other Ambulatory Visit (HOSPITAL_COMMUNITY): Payer: Self-pay | Admitting: Family Medicine

## 2024-04-06 DIAGNOSIS — Z1231 Encounter for screening mammogram for malignant neoplasm of breast: Secondary | ICD-10-CM

## 2024-04-08 ENCOUNTER — Encounter (HOSPITAL_COMMUNITY): Payer: Self-pay

## 2024-04-08 ENCOUNTER — Ambulatory Visit (HOSPITAL_COMMUNITY)
Admission: RE | Admit: 2024-04-08 | Discharge: 2024-04-08 | Disposition: A | Discharge status: Home / Self Care | Source: Ambulatory Visit

## 2024-04-08 DIAGNOSIS — Z1231 Encounter for screening mammogram for malignant neoplasm of breast: Secondary | ICD-10-CM | POA: Diagnosis present
